# Patient Record
Sex: Female | Born: 1940 | Race: White | Hispanic: No | Smoking: Never smoker
Health system: Southern US, Community
[De-identification: ages and names within clinical notes are randomized; demographics above are authoritative.]

## PROBLEM LIST (undated history)

## (undated) DIAGNOSIS — I4891 Unspecified atrial fibrillation: Secondary | ICD-10-CM

## (undated) DIAGNOSIS — IMO0001 Reserved for inherently not codable concepts without codable children: Secondary | ICD-10-CM

## (undated) DIAGNOSIS — I499 Cardiac arrhythmia, unspecified: Secondary | ICD-10-CM

## (undated) DIAGNOSIS — G4700 Insomnia, unspecified: Secondary | ICD-10-CM

## (undated) DIAGNOSIS — K219 Gastro-esophageal reflux disease without esophagitis: Secondary | ICD-10-CM

## (undated) DIAGNOSIS — J205 Acute bronchitis due to respiratory syncytial virus: Secondary | ICD-10-CM

## (undated) DIAGNOSIS — G4730 Sleep apnea, unspecified: Secondary | ICD-10-CM

## (undated) DIAGNOSIS — M1710 Unilateral primary osteoarthritis, unspecified knee: Secondary | ICD-10-CM

## (undated) DIAGNOSIS — I10 Essential (primary) hypertension: Secondary | ICD-10-CM

## (undated) DIAGNOSIS — Z8719 Personal history of other diseases of the digestive system: Secondary | ICD-10-CM

## (undated) DIAGNOSIS — G250 Essential tremor: Secondary | ICD-10-CM

## (undated) DIAGNOSIS — M179 Osteoarthritis of knee, unspecified: Secondary | ICD-10-CM

## (undated) DIAGNOSIS — Z9189 Other specified personal risk factors, not elsewhere classified: Secondary | ICD-10-CM

## (undated) DIAGNOSIS — J42 Unspecified chronic bronchitis: Secondary | ICD-10-CM

## (undated) DIAGNOSIS — E785 Hyperlipidemia, unspecified: Secondary | ICD-10-CM

## (undated) DIAGNOSIS — S301XXA Contusion of abdominal wall, initial encounter: Secondary | ICD-10-CM

## (undated) DIAGNOSIS — R928 Other abnormal and inconclusive findings on diagnostic imaging of breast: Secondary | ICD-10-CM

## (undated) DIAGNOSIS — E039 Hypothyroidism, unspecified: Secondary | ICD-10-CM

## (undated) HISTORY — PX: TONSILLECTOMY: SUR1361

## (undated) HISTORY — DX: Unilateral primary osteoarthritis, unspecified knee: M17.10

## (undated) HISTORY — PX: CARDIAC CATHETERIZATION: SHX172

## (undated) HISTORY — DX: Hypothyroidism, unspecified: E03.9

## (undated) HISTORY — DX: Acute bronchitis due to respiratory syncytial virus: J20.5

## (undated) HISTORY — DX: Osteoarthritis of knee, unspecified: M17.9

## (undated) HISTORY — DX: Essential (primary) hypertension: I10

## (undated) HISTORY — DX: Hyperlipidemia, unspecified: E78.5

## (undated) HISTORY — PX: CARDIOVERSION: SHX1299

## (undated) HISTORY — DX: Unspecified atrial fibrillation: I48.91

## (undated) HISTORY — DX: Personal history of other diseases of the digestive system: Z87.19

## (undated) HISTORY — DX: Other specified personal risk factors, not elsewhere classified: Z91.89

## (undated) HISTORY — DX: Other abnormal and inconclusive findings on diagnostic imaging of breast: R92.8

## (undated) HISTORY — PX: JOINT REPLACEMENT: SHX530

## (undated) HISTORY — DX: Insomnia, unspecified: G47.00

## (undated) HISTORY — PX: DILATION AND CURETTAGE OF UTERUS: SHX78

## (undated) HISTORY — DX: Essential tremor: G25.0

---
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
## 1969-09-02 DIAGNOSIS — Z789 Other specified health status: Secondary | ICD-10-CM

## 1969-09-02 HISTORY — DX: Other specified health status: Z78.9

## 1969-09-02 HISTORY — PX: ABDOMINAL EXPLORATION SURGERY: SHX538

## 1969-09-02 HISTORY — PX: VAGINAL HYSTERECTOMY: SUR661

## 1978-09-02 HISTORY — PX: CHOLECYSTECTOMY: SHX55

## 2001-03-06 ENCOUNTER — Emergency Department (HOSPITAL_COMMUNITY): Admission: EM | Admit: 2001-03-06 | Discharge: 2001-03-06 | Payer: Self-pay | Admitting: Emergency Medicine

## 2001-03-06 ENCOUNTER — Encounter: Payer: Self-pay | Admitting: Emergency Medicine

## 2001-03-09 ENCOUNTER — Inpatient Hospital Stay (HOSPITAL_COMMUNITY): Admission: AD | Admit: 2001-03-09 | Discharge: 2001-03-10 | Payer: Self-pay | Admitting: Cardiology

## 2001-03-09 ENCOUNTER — Encounter: Payer: Self-pay | Admitting: Emergency Medicine

## 2001-03-13 ENCOUNTER — Ambulatory Visit (HOSPITAL_COMMUNITY): Admission: RE | Admit: 2001-03-13 | Discharge: 2001-03-13 | Payer: Self-pay | Admitting: Cardiology

## 2003-09-03 HISTORY — PX: REPLACEMENT TOTAL KNEE: SUR1224

## 2007-03-12 ENCOUNTER — Ambulatory Visit: Payer: Self-pay | Admitting: Internal Medicine

## 2008-09-02 DIAGNOSIS — I4891 Unspecified atrial fibrillation: Secondary | ICD-10-CM

## 2008-09-02 HISTORY — DX: Unspecified atrial fibrillation: I48.91

## 2008-10-14 ENCOUNTER — Inpatient Hospital Stay: Payer: Self-pay | Admitting: Cardiovascular Disease

## 2009-06-24 ENCOUNTER — Observation Stay (HOSPITAL_COMMUNITY): Admission: EM | Admit: 2009-06-24 | Discharge: 2009-06-25 | Payer: Self-pay | Admitting: Emergency Medicine

## 2009-06-28 ENCOUNTER — Emergency Department (HOSPITAL_COMMUNITY): Admission: EM | Admit: 2009-06-28 | Discharge: 2009-06-28 | Payer: Self-pay | Admitting: Family Medicine

## 2009-07-04 ENCOUNTER — Inpatient Hospital Stay (HOSPITAL_COMMUNITY): Admission: EM | Admit: 2009-07-04 | Discharge: 2009-07-07 | Payer: Self-pay | Admitting: Emergency Medicine

## 2009-07-04 ENCOUNTER — Encounter (INDEPENDENT_AMBULATORY_CARE_PROVIDER_SITE_OTHER): Payer: Self-pay | Admitting: Internal Medicine

## 2009-07-04 ENCOUNTER — Ambulatory Visit: Payer: Self-pay | Admitting: Surgery

## 2009-07-06 ENCOUNTER — Ambulatory Visit: Payer: Self-pay | Admitting: Infectious Diseases

## 2009-10-19 ENCOUNTER — Ambulatory Visit: Payer: Self-pay

## 2010-12-05 LAB — BASIC METABOLIC PANEL
BUN: 11 mg/dL (ref 6–23)
Calcium: 8.5 mg/dL (ref 8.4–10.5)
Chloride: 104 mEq/L (ref 96–112)
Chloride: 105 mEq/L (ref 96–112)
Creatinine, Ser: 0.81 mg/dL (ref 0.4–1.2)
Creatinine, Ser: 0.94 mg/dL (ref 0.4–1.2)
GFR calc Af Amer: >60 mL/min (ref >60)
GFR calc Af Amer: >60 mL/min (ref >60)
GFR calc non Af Amer: 59 mL/min — ABNORMAL LOW (ref >60)
Potassium: 4.2 mEq/L (ref 3.5–5.1)
Potassium: 4.3 mEq/L (ref 3.5–5.1)

## 2010-12-05 LAB — DIFFERENTIAL
Basophils Absolute: 0.1 10*3/uL (ref 0.0–0.1)
Basophils Relative: 1 % (ref 0–1)
Eosinophils Absolute: 0.2 10*3/uL (ref 0.0–0.7)
Eosinophils Relative: 2 % (ref 0–5)
Lymphocytes Relative: 19 % (ref 12–46)
Lymphocytes Relative: 7 % — ABNORMAL LOW (ref 12–46)
Monocytes Absolute: 0.1 10*3/uL (ref 0.1–1.0)
Monocytes Relative: 1 % — ABNORMAL LOW (ref 3–12)
Monocytes Relative: 8 % (ref 3–12)
Neutro Abs: 6.1 10*3/uL (ref 1.7–7.7)
Neutro Abs: 7.4 10*3/uL (ref 1.7–7.7)
Neutrophils Relative %: 71 % (ref 43–77)

## 2010-12-05 LAB — CULTURE, BLOOD (ROUTINE X 2)
Culture: NO GROWTH
Culture: NO GROWTH
Culture: NO GROWTH

## 2010-12-05 LAB — CBC
HCT: 33.5 % — ABNORMAL LOW (ref 36.0–46.0)
HCT: 36.3 % (ref 36.0–46.0)
HCT: 36.5 % (ref 36.0–46.0)
Hemoglobin: 12.5 g/dL (ref 12.0–15.0)
Hemoglobin: 13.5 g/dL (ref 12.0–15.0)
MCHC: 34.4 g/dL (ref 30.0–36.0)
MCV: 87.9 fL (ref 78.0–100.0)
Platelets: 281 10*3/uL (ref 150–400)
Platelets: 299 10*3/uL (ref 150–400)
RDW: 15.2 % (ref 11.5–15.5)
WBC: 7.2 10*3/uL (ref 4.0–10.5)
WBC: 8 10*3/uL (ref 4.0–10.5)
WBC: 8.6 10*3/uL (ref 4.0–10.5)
WBC: 8.8 10*3/uL (ref 4.0–10.5)

## 2010-12-05 LAB — PROTIME-INR: INR: 0.95 (ref 0.00–1.49)

## 2010-12-06 LAB — URINALYSIS, ROUTINE W REFLEX MICROSCOPIC
Bilirubin Urine: NEGATIVE
Glucose, UA: NEGATIVE mg/dL
Ketones, ur: NEGATIVE mg/dL
Leukocytes, UA: NEGATIVE
Protein, ur: NEGATIVE mg/dL

## 2010-12-06 LAB — CBC
HCT: 43.2 % (ref 36.0–46.0)
MCV: 86.7 fL (ref 78.0–100.0)
Platelets: 195 10*3/uL (ref 150–400)
RBC: 4.99 MIL/uL (ref 3.87–5.11)

## 2010-12-06 LAB — POCT I-STAT, CHEM 8
BUN: 20 mg/dL (ref 6–23)
Calcium, Ion: 1.14 mmol/L (ref 1.12–1.32)
Creatinine, Ser: 0.8 mg/dL (ref 0.4–1.2)
Potassium: 4.3 mEq/L (ref 3.5–5.1)
Sodium: 138 mEq/L (ref 135–145)
TCO2: 25 mmol/L (ref 0–100)

## 2010-12-06 LAB — COMPREHENSIVE METABOLIC PANEL
ALT: 27 U/L (ref 0–35)
AST: 38 U/L — ABNORMAL HIGH (ref 0–37)
Albumin: 4.1 g/dL (ref 3.5–5.2)
CO2: 28 mEq/L (ref 19–32)
Calcium: 8.9 mg/dL (ref 8.4–10.5)
Chloride: 103 mEq/L (ref 96–112)
Glucose, Bld: 126 mg/dL — ABNORMAL HIGH (ref 70–99)
Sodium: 137 mEq/L (ref 135–145)
Total Bilirubin: 1.1 mg/dL (ref 0.3–1.2)
Total Protein: 7 g/dL (ref 6.0–8.3)

## 2010-12-06 LAB — TYPE AND SCREEN: ABO/RH(D): AB POS

## 2010-12-06 LAB — ABO/RH: ABO/RH(D): AB POS

## 2010-12-06 LAB — PROTIME-INR: INR: 0.94 (ref 0.00–1.49)

## 2011-01-18 NOTE — Discharge Summary (Signed)
[place]. [facility]  Patient:    Mckenzie Frank, Mckenzie Frank                         MRN: [id number] Adm. Date:  [id number] Disc. Date: [id number] Attending:  Nelta Numbers Dictator:   Brita Romp, P.A. CC:         Kari Baars, M.D., [address]  [place], [place] [address]   Discharge Summary  There was no dictation for this report.  Just added CC. DD:  03/10/01 TD:  03/10/01 Job: [id number]

## 2011-01-18 NOTE — Discharge Summary (Signed)
[place]. [facility]  Patient:    Mckenzie Frank, Mckenzie Frank                         MRN: [id number] Adm. Date:  [id number] Disc. Date: 03/10/01 Attending:  Nelta Numbers Dictator:   Brita Romp, P.A.C. CC:          Cardiology, [place] [facility]   Discharge Summary  DISCHARGE DIAGNOSES: 1. Chest pain, negative cardiac enzymes. 2. Hypertension. 3. Treated hypothyroidism. 4. Recurrent headaches. 5. Anxiety.  HOSPITAL COURSE:  Ms. Shanon Rosser is a 70 year old female nonsmoker with a long history of hypertension.  She presented to the [facility] ER complaining of chest pain and headaches.  After evaluation there, she was transferred to [facility] for further cardiac workup.  She was seen and admitted by Dr. Antoine Poche.  He felt that her chest pain was atypical with no evidence of ischemia.  He felt that if her cardiac enzymes were negative then she could be safely scheduled for an out of hospital stress test.  She also reported some palpitations which she felt could best be evaluated with outpatient event monitor.   He also felt that her hypertension needed further investigation and that her workup to rule out a theochromocytoma would be warranted.  The next day, the patient was seen by Gerrit Friends. Rothbart, M.D. [facility]  She reported some minor nausea, but no further chest pain.  Dr. Dietrich Pates noted that the patients enzymes were serially negative and that her second EKG was also negative.  As a result, he felt that the patient was stable for discharge.  MEDICATIONS:  1. Catapres TTS 0.2 mg size change weekly.  2. Labetolol 200 mg b.i.d.  3. Norvasc 10 mg q.d.  4. Hydrochlorothiazide 12.5 mg q.d.  5. K-Dur 10 mEq q.d.  6. Avapro 150 mg q.d.  7. Xanax 0.25 mg t.i.d. as needed.  8. Premarin 0.625 mg q.d.  9. Levoxyl 0.025 mg q.d. 10. Neurontin 300 mg b.i.d. 11. Celebrex as previously taken.  DISCHARGE INSTRUCTIONS:  The  patient was advised to return to a normal level of activity.  She was instructed to follow a low salt and low fat.  She was instructed to stop taking the Lasix or Clonidine pills she had at home.  She is to follow up with a P.A. visit in the [facility] on July 23, at 12:30.  She is to also obtain an outpatient stress test as well as Doppler carotid ultrasound at [facility] and the office is to call with times.  LABORATORY DATA:  White count 6.1, hemoglobin 14.6, hematocrit 42.4, platelets 241.  Sodium 140, potassium 4.1, chloride 106, CO2 26, BUN 12, creatinine 0.8, glucose 97.  Cardiac enzymes were negative for MI.  TSH was 3.171.  Electrocardiogram revealed normal sinus rhythm with a first degree AV block. Ventricular rate was 66.  PR interval was 0.228, QRS 0.102, QTC 0.446 with an axis of -17. DD:  03/10/01 TD:  03/10/01 Job: [id number]

## 2011-07-23 ENCOUNTER — Ambulatory Visit: Payer: Self-pay | Admitting: Family Medicine

## 2011-07-31 ENCOUNTER — Telehealth (INDEPENDENT_AMBULATORY_CARE_PROVIDER_SITE_OTHER): Payer: Self-pay

## 2011-07-31 NOTE — Telephone Encounter (Signed)
LMOM to remind pt to bring films and reports from [facility].

## 2011-08-06 ENCOUNTER — Encounter (INDEPENDENT_AMBULATORY_CARE_PROVIDER_SITE_OTHER): Payer: Self-pay | Admitting: General Surgery

## 2011-08-15 ENCOUNTER — Other Ambulatory Visit (INDEPENDENT_AMBULATORY_CARE_PROVIDER_SITE_OTHER): Payer: Self-pay | Admitting: General Surgery

## 2011-08-15 ENCOUNTER — Other Ambulatory Visit (INDEPENDENT_AMBULATORY_CARE_PROVIDER_SITE_OTHER): Payer: Self-pay | Admitting: Surgery

## 2011-08-15 ENCOUNTER — Ambulatory Visit (INDEPENDENT_AMBULATORY_CARE_PROVIDER_SITE_OTHER): Payer: Medicare Other | Admitting: General Surgery

## 2011-08-15 ENCOUNTER — Encounter (INDEPENDENT_AMBULATORY_CARE_PROVIDER_SITE_OTHER): Payer: Self-pay | Admitting: General Surgery

## 2011-08-15 VITALS — BP 130/72 | HR 60 | Temp 97.6°F | Resp 24 | Ht 67.5 in | Wt 282.0 lb

## 2011-08-15 DIAGNOSIS — N6320 Unspecified lump in the left breast, unspecified quadrant: Secondary | ICD-10-CM

## 2011-08-15 DIAGNOSIS — N63 Unspecified lump in unspecified breast: Secondary | ICD-10-CM

## 2011-08-15 NOTE — Progress Notes (Signed)
Patient ID: Mckenzie Frank, female   DOB: 08-14-1941, 70 y.o.   MRN: [id number]  Chief Complaint  Patient presents with  . Other    new pt- eval lt breast mass    HPI Mckenzie Frank is a 70 y.o. female.  She is referred by Dr. Lupe Carney for evaluation of an abnormal mammogram that was done at [facility].  The patient states that 2 weeks ago she felt a small lump in her left breast medially at the 9:00 position. She has not had any breast problems in the past. She had ultrasound and mammogram at [facility]. I have a CD of this but can only see a few of the images. Described is a small 7 mm density in the left breast at the 9:00 position 6 cm from the nipple. They called this BI-RADS category 4 and she was referred for surgical consultation.  Family history is negative for breast cancer and negative for ovarian cancer.  Significant medical history of atrial fibrillation on Coumadin, hypertension, and thyroid disease.HPI  Past Medical History  Diagnosis Date  . Atrial fibrillation   . Hyperlipidemia   . Hypertension   . Thyroid disease   . Arthritis     Past Surgical History  Procedure Date  . Joint replacement     knee  . Abdominal hysterectomy   . Appendectomy   . Dilation and curettage of uterus     Family History  Problem Relation Age of Onset  . Heart disease Mother     Social History History  Substance Use Topics  . Smoking status: Never Smoker   . Smokeless tobacco: Not on file  . Alcohol Use: No    Allergies  Allergen Reactions  . Clindamycin/Lincomycin   . Doxycycline   . Sulfa Antibiotics   . Valium     Current Outpatient Prescriptions  Medication Sig Dispense Refill  . amLODipine-benazepril (LOTREL) 10-20 MG per capsule Take 1 capsule by mouth daily.        . flecainide (TAMBOCOR) 100 MG tablet Take 100 mg by mouth 2 (two) times daily.        . furosemide (LASIX) 40 MG tablet Take 40 mg by mouth 2 (two) times daily.        .  lansoprazole (PREVACID) 15 MG capsule Take 15 mg by mouth daily.        Marland Kitchen levothyroxine (SYNTHROID, LEVOTHROID) 25 MCG tablet Take 25 mcg by mouth daily.        . metoprolol (LOPRESSOR) 50 MG tablet Take 50 mg by mouth 2 (two) times daily.        . Nutritional Supplements (MELATONIN PO) Take 5 mg by mouth daily.        . simvastatin (ZOCOR) 40 MG tablet Take 40 mg by mouth at bedtime.        Marland Kitchen warfarin (COUMADIN) 5 MG tablet Take 5 mg by mouth daily.          Review of Systems Review of Systems  Constitutional: Negative for fever, chills and unexpected weight change.  HENT: Negative for hearing loss, congestion, sore throat, trouble swallowing and voice change.   Eyes: Negative for visual disturbance.  Respiratory: Negative for cough and wheezing.   Cardiovascular: Negative for chest pain, palpitations and leg swelling.  Gastrointestinal: Negative for nausea, vomiting, abdominal pain, diarrhea, constipation, blood in stool, abdominal distention and anal bleeding.  Genitourinary: Negative for hematuria, vaginal bleeding and difficulty urinating.  Musculoskeletal: Negative for arthralgias.  Skin: Negative for rash and wound.  Neurological: Negative for seizures, syncope and headaches.  Hematological: Negative for adenopathy. Does not bruise/bleed easily.  Psychiatric/Behavioral: Negative for confusion.    Blood pressure 130/72, pulse 60, temperature 97.6 F (36.4 C), temperature source Temporal, resp. rate 24, height 5' 7.5" (1.715 m), weight 282 lb (127.914 kg).  Physical Exam Physical Exam  Constitutional: She is oriented to person, place, and time. She appears well-developed and well-nourished. No distress.  HENT:  Head: Normocephalic and atraumatic.  Nose: Nose normal.  Mouth/Throat: No oropharyngeal exudate.  Eyes: Conjunctivae and EOM are normal. Pupils are equal, round, and reactive to light. Left eye exhibits no discharge. No scleral icterus.  Neck: Neck supple. No JVD  present. No tracheal deviation present. No thyromegaly present.  Cardiovascular: Normal rate, regular rhythm, normal heart sounds and intact distal pulses.   No murmur heard. Pulmonary/Chest: Effort normal and breath sounds normal. No respiratory distress. She has no wheezes. She has no rales. She exhibits no tenderness.    Abdominal: Soft. Bowel sounds are normal. She exhibits no distension and no mass. There is no tenderness. There is no rebound and no guarding.  Musculoskeletal: She exhibits no edema and no tenderness.  Lymphadenopathy:    She has no cervical adenopathy.  Neurological: She is alert and oriented to person, place, and time. She exhibits normal muscle tone. Coordination normal.       Head tremor.  Skin: Skin is warm. No rash noted. She is not diaphoretic. No erythema. No pallor.  Psychiatric: She has a normal mood and affect. Her behavior is normal. Judgment and thought content normal.    Data Reviewed I have reviewed the images from now has regional that were sent on a CD. Assessment    Left breast mass, 7 mm, 9:00 position. Indeterminate significance. Further evaluation warranted. Biopsy possibly indicated.  Chronic atrial fibrillation on Coumadin  Hypertension  Thyroid disease    Plan    The patient is referred to the breast center of [facility] for a second radiologic opinion.  Return to see me in 2-3 weeks after this opinion is completed.       Kaiyana Bedore M 08/15/2011, 10:46 AM

## 2011-08-15 NOTE — Patient Instructions (Signed)
I can feel the small lump in your left breast at the 9:00 position. I am not sure whether this represents an abnormality or not. I am referring you for a second opinion at the breast center of [facility] radiologist. You will return to see me in 2-3 weeks after their evaluation is completed.

## 2011-08-20 ENCOUNTER — Ambulatory Visit
Admission: RE | Admit: 2011-08-20 | Discharge: 2011-08-20 | Disposition: A | Discharge status: Home / Self Care | Payer: Self-pay | Source: Ambulatory Visit | Attending: General Surgery | Admitting: General Surgery

## 2011-08-20 ENCOUNTER — Other Ambulatory Visit (INDEPENDENT_AMBULATORY_CARE_PROVIDER_SITE_OTHER): Payer: Self-pay | Admitting: General Surgery

## 2011-08-20 DIAGNOSIS — N6320 Unspecified lump in the left breast, unspecified quadrant: Secondary | ICD-10-CM

## 2011-08-21 ENCOUNTER — Encounter (INDEPENDENT_AMBULATORY_CARE_PROVIDER_SITE_OTHER): Payer: Self-pay | Admitting: Family Medicine

## 2011-09-09 ENCOUNTER — Ambulatory Visit (INDEPENDENT_AMBULATORY_CARE_PROVIDER_SITE_OTHER): Payer: Medicare Other | Admitting: General Surgery

## 2011-09-09 ENCOUNTER — Encounter (INDEPENDENT_AMBULATORY_CARE_PROVIDER_SITE_OTHER): Payer: Self-pay | Admitting: General Surgery

## 2011-09-09 VITALS — BP 142/74 | HR 56 | Temp 97.2°F | Resp 18 | Ht 67.5 in | Wt 283.2 lb

## 2011-09-09 DIAGNOSIS — N641 Fat necrosis of breast: Secondary | ICD-10-CM

## 2011-09-09 NOTE — Patient Instructions (Signed)
The left breast ultrasound performed at the breast Center of [facility] shows that you probably have an area of benign fat necrosis in the medial aspect of the left breast. We discussed whether to go ahead with a biopsy at this time, or to simply repeat the imaging studies and physical exam in 6 months.  We decided that we would repeat the left breast mammogram and ultrasound in 6 months at the breast Center of [place], and you will see me in the office after that is done for interval followup.

## 2011-09-09 NOTE — Progress Notes (Signed)
Subjective:     Patient ID: Mckenzie Frank, female   DOB: 1941/06/05, 71 y.o.   MRN: [id number]  HPI The patient has a small palpable nodule just deep to the dermis in the left breast at the 9:00 position.  She was referred to the [facility] for second opinion. Dr. Anselmo Pickler performed ultrasound and felt that she probably had an area of fat necrosis medially within the left breast at the 9:00 position corresponding to the palpable finding. He advised 6 month followup.  I talked with the patient for a long time and she is comfortable with six-month followup rather than biopsy.  Review of Systems     Objective:   Physical Exam Constitutional: Patient comfortable in no distress.  Breasts: Left breast is examined. Breast is large. At that position there is about a 5 mm very superficial smooth nodule. There is no overlying skin change.    Assessment:     Left breast mass, 5 mm, 9:00 position. Physical findings are consistent with a small cyst, fibroadenoma, or area of fat necrosis. Ultrasound suggests that necrosis.    Plan:     The patient was given the option of 6 month followup or excision of this mass surgically. She has elected 6 month followup. She understands that there is a very low, but still measurable risk that this could be a very early cancer. I doubt that is the case.  Repeat left breast mammogram and ultrasound 6 months, and followup with me in the office at that time.  The patient was told to call me and followup sooner if the mass enlarges or if there is any overlying skin change.

## 2011-12-30 DIAGNOSIS — Z8719 Personal history of other diseases of the digestive system: Secondary | ICD-10-CM | POA: Insufficient documentation

## 2011-12-30 DIAGNOSIS — Z7901 Long term (current) use of anticoagulants: Secondary | ICD-10-CM | POA: Insufficient documentation

## 2012-01-29 ENCOUNTER — Encounter (INDEPENDENT_AMBULATORY_CARE_PROVIDER_SITE_OTHER): Payer: Self-pay | Admitting: General Surgery

## 2012-06-16 ENCOUNTER — Telehealth: Payer: Self-pay | Admitting: Family Medicine

## 2012-06-16 NOTE — Telephone Encounter (Signed)
Ok to change appt.

## 2012-06-16 NOTE — Telephone Encounter (Signed)
Li unless there is an open appointment for a new patient I cannot take up any more urgent slots with new patients. I have no urgent slot available for my already established patient's. Her previous doctor can check her Coumadin level until she comes in to see me

## 2012-06-16 NOTE — Telephone Encounter (Signed)
Set up a New pt appointment with Dr. Darrick Huntsman in February but pt is on coumadin and needs PT/INR checked. She was wondering if she could get this appointment for New pt moved up. Her sister comes to Dr. Darrick Huntsman and highly recommeneds her and Mrs. Heidler really wants to see Dr. Darrick Huntsman.

## 2012-06-23 ENCOUNTER — Telehealth: Payer: Self-pay | Admitting: *Deleted

## 2012-07-07 ENCOUNTER — Other Ambulatory Visit: Payer: Self-pay

## 2012-07-07 ENCOUNTER — Ambulatory Visit (INDEPENDENT_AMBULATORY_CARE_PROVIDER_SITE_OTHER): Payer: Self-pay | Admitting: Family Medicine

## 2012-07-07 ENCOUNTER — Encounter: Payer: Self-pay | Admitting: Family Medicine

## 2012-07-07 VITALS — BP 160/90 | HR 64 | Temp 97.6°F | Ht 67.5 in | Wt 275.5 lb

## 2012-07-07 DIAGNOSIS — E785 Hyperlipidemia, unspecified: Secondary | ICD-10-CM

## 2012-07-07 DIAGNOSIS — Z23 Encounter for immunization: Secondary | ICD-10-CM

## 2012-07-07 DIAGNOSIS — I4891 Unspecified atrial fibrillation: Secondary | ICD-10-CM

## 2012-07-07 DIAGNOSIS — G252 Other specified forms of tremor: Secondary | ICD-10-CM

## 2012-07-07 DIAGNOSIS — Z96659 Presence of unspecified artificial knee joint: Secondary | ICD-10-CM | POA: Insufficient documentation

## 2012-07-07 DIAGNOSIS — N63 Unspecified lump in unspecified breast: Secondary | ICD-10-CM

## 2012-07-07 DIAGNOSIS — Z5181 Encounter for therapeutic drug level monitoring: Secondary | ICD-10-CM

## 2012-07-07 DIAGNOSIS — R251 Tremor, unspecified: Secondary | ICD-10-CM | POA: Insufficient documentation

## 2012-07-07 DIAGNOSIS — Z7901 Long term (current) use of anticoagulants: Secondary | ICD-10-CM

## 2012-07-07 DIAGNOSIS — M1710 Unilateral primary osteoarthritis, unspecified knee: Secondary | ICD-10-CM

## 2012-07-07 DIAGNOSIS — E039 Hypothyroidism, unspecified: Secondary | ICD-10-CM

## 2012-07-07 DIAGNOSIS — G250 Essential tremor: Secondary | ICD-10-CM

## 2012-07-07 DIAGNOSIS — I10 Essential (primary) hypertension: Secondary | ICD-10-CM | POA: Insufficient documentation

## 2012-07-07 LAB — POCT INR: INR: 1.9

## 2012-07-07 MED ORDER — FLECAINIDE ACETATE 100 MG PO TABS: 100.0000 mg | ORAL_TABLET | Freq: Two times a day (BID) | ORAL | Status: DC

## 2012-07-07 MED ORDER — LEVOTHYROXINE SODIUM 25 MCG PO TABS: 25.0000 ug | ORAL_TABLET | Freq: Every day | ORAL | Status: DC

## 2012-07-07 NOTE — Patient Instructions (Addendum)
Flu shot today. We will request records from Dr. Milas Gain and Dr. Caro Hight. Coumadin check today.  Indication Afib.  Goal INR 2-3. Pass by [place]'s office to schedule appointment with cardiologist in [place] ([place] or [place]). Return in 3 months fasting for blood work, afterwards for physical. Keep track of blood pressures at home.  If staying consistently >140/90, please let me know for increase in your blood pressure medicines.  Call me in 1-2 weeks with an update on your numbers.

## 2012-07-07 NOTE — Assessment & Plan Note (Signed)
Chronic. Check FLP next fasting blood work.

## 2012-07-07 NOTE — Telephone Encounter (Signed)
Rx's refilled as requested. New patient today-no labs.

## 2012-07-07 NOTE — Patient Instructions (Addendum)
2.5 mg daily, 5 mg MWF. Check in 2 weeks

## 2012-07-07 NOTE — Assessment & Plan Note (Signed)
Pt has noted for a while, not bothersome.  Declines meds.

## 2012-07-07 NOTE — Progress Notes (Signed)
Subjective:    Patient ID: Mckenzie Frank, female    DOB: 1940-09-15, 71 y.o.   MRN: [id number]  HPI CC: new pt to establish  Prior saw [facility].  Saw Dr. Caro Hight.  Then moved to [place].  Now moved back to [place], living in senior apartments.    afib - on flecainide 100 mg bid, metoprolol 50mg  bid, and coumadin (5mg  MWF, 2.5mg  every other day).  No recent INR check.  Would like [place] to follow today.  Takes for Afib, goal INR 2-3.  Borderline hypothyroid - on levo daily.  Breast mass, left - stable as of recent mammograms.  Thought possibly trauma related.  ?fat necrosis.  HTN - compliant with bp meds.  Does not check at home.    HLD - compliant and tolerant of zocor 10mg  daily.  No myalgias.  Last year had breast knot s/p mammogram and [place], ultimately normal biopsy.  Thought trauma related mass.  Rpt mammogram this year stable.  Skin tag on bottom of stomach - worried about this.  Preventative: Last CPE - 2012. Well woman with PCP H/o partial hysterectomy (1971) Mammogram 02/2012 Colonoscopy - 2005 normal per pt Tetanus - 2012 Flu shot - will receive today.  Caffeine: 1 cup coffee/day Lives alone in senior living.  Daughter in [place] Occupation: retired, Engineering geologist Edu: HS Activity: walks the track Diet:   Medications and allergies reviewed and updated in chart.  Past histories reviewed and updated if relevant as below. Patient Active Problem List  Diagnosis  . Atrial fibrillation  . Hyperlipidemia  . Hypertension  . Hypothyroidism  . Osteoarthritis of knee  . Breast mass  . Benign head tremor   Past Medical History  Diagnosis Date  . Atrial fibrillation 2010    on coumadin  . Hyperlipidemia   . Hypertension   . Hypothyroidism     borderline  . Osteoarthritis of knee     s/p replacement  . Breast mass     left  . Benign head tremor    Past Surgical History  Procedure Date  . Replacement total knee 2005    bilateral  . Abdominal  hysterectomy 1971    partial?  . Appendectomy 1947  . Dilation and curettage of uterus   . Cholecystectomy 1980   History  Substance Use Topics  . Smoking status: Never Smoker   . Smokeless tobacco: Never Used  . Alcohol Use: No   Family History  Problem Relation Age of Onset  . CAD Mother     angina  . Cancer Paternal Grandfather     ?  Marland Kitchen Alzheimer's disease Father     died from PNA  . Diabetes Mother   . Stroke Mother 58   Allergies  Allergen Reactions  . Clindamycin/Lincomycin   . Codeine   . Doxycycline   . Paxil (Paroxetine Hcl)   . Promethazine   . Sulfa Antibiotics   . Valium    Current Outpatient Prescriptions on File Prior to Visit  Medication Sig Dispense Refill  . amLODipine-benazepril (LOTREL) 10-20 MG per capsule Take 1 capsule by mouth daily.        . clonazePAM (KLONOPIN) 0.5 MG tablet Take 0.5 mg by mouth at bedtime.      . flecainide (TAMBOCOR) 100 MG tablet Take 100 mg by mouth 2 (two) times daily.       . furosemide (LASIX) 40 MG tablet Take 40 mg by mouth daily. 2nd dose PRN      .  lansoprazole (PREVACID) 15 MG capsule Take 15 mg by mouth daily.        Marland Kitchen levothyroxine (SYNTHROID, LEVOTHROID) 25 MCG tablet Take 25 mcg by mouth daily.        . metoprolol (LOPRESSOR) 50 MG tablet Take 50 mg by mouth 2 (two) times daily.        Marland Kitchen warfarin (COUMADIN) 5 MG tablet Take 5 mg by mouth daily.          Review of Systems  Constitutional: Negative for fever, chills, activity change, appetite change, fatigue and unexpected weight change.  HENT: Negative for hearing loss and neck pain.   Eyes: Negative for visual disturbance.  Respiratory: Negative for cough, chest tightness, shortness of breath and wheezing.   Cardiovascular: Negative for chest pain, palpitations and leg swelling.  Gastrointestinal: Negative for nausea, vomiting, abdominal pain, diarrhea, constipation, blood in stool and abdominal distention.  Genitourinary: Negative for hematuria and  difficulty urinating.  Musculoskeletal: Negative for myalgias and arthralgias.  Skin: Negative for rash.  Neurological: Negative for dizziness, seizures, syncope and headaches.  Hematological: Does not bruise/bleed easily.  Psychiatric/Behavioral: Negative for dysphoric mood. The patient is not nervous/anxious.        Objective:   Physical Exam  Nursing note and vitals reviewed. Constitutional: She is oriented to person, place, and time. She appears well-developed and well-nourished. No distress.       obese Head tremor noted  HENT:  Head: Normocephalic and atraumatic.  Right Ear: Hearing, tympanic membrane, external ear and ear canal normal.  Left Ear: Hearing, tympanic membrane, external ear and ear canal normal.  Nose: Nose normal.  Mouth/Throat: Oropharynx is clear and moist. No oropharyngeal exudate.  Eyes: Conjunctivae normal and EOM are normal. Pupils are equal, round, and reactive to light. No scleral icterus.  Neck: Normal range of motion. Neck supple. No thyromegaly present.  Cardiovascular: Normal rate, regular rhythm, normal heart sounds and intact distal pulses.   No murmur heard. Pulses:      Radial pulses are 2+ on the right side, and 2+ on the left side.       Sounding regular today  Pulmonary/Chest: Effort normal and breath sounds normal. No respiratory distress. She has no wheezes. She has no rales.  Abdominal: Soft. Bowel sounds are normal. She exhibits no distension and no mass. There is no tenderness. There is no rebound and no guarding.  Musculoskeletal: Normal range of motion. She exhibits no edema.  Lymphadenopathy:    She has no cervical adenopathy.  Neurological: She is alert and oriented to person, place, and time.       CN grossly intact, station and gait intact  Skin: Skin is warm and dry. No rash noted.       benign skin tag right lower abd  Psychiatric: She has a normal mood and affect. Her behavior is normal. Judgment and thought content normal.        Assessment & Plan:

## 2012-07-07 NOTE — Assessment & Plan Note (Signed)
Have requested records from prior PCP.  Last mammo per pt 02/2012, stable.  Just monitoring currently.

## 2012-07-07 NOTE — Assessment & Plan Note (Signed)
Not active problem since replacement.

## 2012-07-07 NOTE — Telephone Encounter (Signed)
Brianna called back pt only needs flecainide and levothyroxine 25 mcg refilled to [facility]. Was not sure of quantity or refills; did not see recent thyroid labs.Please advise.

## 2012-07-07 NOTE — Assessment & Plan Note (Signed)
Elevated today. Asked her to keep track at home and call me in 1-2 wks with log.  If staying elevated, change bp meds as needed. Pt agrees with plan. Have requested records from prior cards and prior PCP.

## 2012-07-07 NOTE — Assessment & Plan Note (Signed)
Will need TSH next blood draw.

## 2012-07-07 NOTE — Telephone Encounter (Signed)
Colin Mulders with [facility] pharmacy said she will contact pt's previous pharmacy and get meds transferred if there is a need for Dr Reece Agar to refill med now she will fax request to our office. Colin Mulders said pt OK with that.

## 2012-07-07 NOTE — Assessment & Plan Note (Signed)
Will establish with our coumadin clinic today as due for coumadin check.  Goal INR 2-3, indication afib. CHADS2 score = 1 as far as I can tell, will await records from prior cards and PCP.  H/o "heart shocks" x2. Will also refer to cards for assistance with anti arrhythmic.   Today sounds regular on exam.

## 2012-07-13 ENCOUNTER — Ambulatory Visit (INDEPENDENT_AMBULATORY_CARE_PROVIDER_SITE_OTHER): Payer: Medicare Other | Admitting: Cardiovascular Disease

## 2012-07-13 ENCOUNTER — Encounter: Payer: Self-pay | Admitting: Cardiovascular Disease

## 2012-07-13 VITALS — BP 128/62 | HR 54 | Ht 67.0 in | Wt 275.5 lb

## 2012-07-13 DIAGNOSIS — I4891 Unspecified atrial fibrillation: Secondary | ICD-10-CM

## 2012-07-13 DIAGNOSIS — I10 Essential (primary) hypertension: Secondary | ICD-10-CM

## 2012-07-13 NOTE — Progress Notes (Signed)
HPI  This is a 71 year old female who is here today to establish cardiovascular care. She has known history of paroxysmal atrial fibrillation diagnosed in 2010. She had cardioversion done once at that time. It seems that she had recurrent atrial fibrillation in 2011 and was placed on flecainide by Dr. Caro Hight her previous cardiologist. The patient moved back to the area and wants to establish cardiovascular followup year. She reports having a cardiac catheterization many years ago by Dr. Juliann Pares which showed no significant coronary artery disease. She thinks her last echocardiogram was in 2012. Since she has been on flecainide, she reports no further episodes of atrial fibrillation. She denies any chest pain or dyspnea. She has no history of congestive heart failure, stroke or diabetes.  Allergies  Allergen Reactions  . Clindamycin/Lincomycin   . Codeine   . Doxycycline   . Paxil (Paroxetine Hcl)   . Promethazine   . Sulfa Antibiotics   . Valium      Current Outpatient Prescriptions on File Prior to Visit  Medication Sig Dispense Refill  . amLODipine-benazepril (LOTREL) 10-20 MG per capsule Take 1 capsule by mouth daily.        . clonazePAM (KLONOPIN) 0.5 MG tablet Take 0.5 mg by mouth at bedtime.      . flecainide (TAMBOCOR) 100 MG tablet Take 1 tablet (100 mg total) by mouth 2 (two) times daily.  60 tablet  3  . furosemide (LASIX) 40 MG tablet Take 40 mg by mouth daily. 2nd dose PRN      . lansoprazole (PREVACID) 15 MG capsule Take 15 mg by mouth daily.        Marland Kitchen levothyroxine (SYNTHROID, LEVOTHROID) 25 MCG tablet Take 1 tablet (25 mcg total) by mouth daily.  30 tablet  3  . metoprolol (LOPRESSOR) 50 MG tablet Take 50 mg by mouth 2 (two) times daily.        . simvastatin (ZOCOR) 20 MG tablet Take 10 mg by mouth every evening.      . warfarin (COUMADIN) 5 MG tablet Take 5 mg by mouth as directed.          Past Medical History  Diagnosis Date  . Hyperlipidemia   . Hypertension     . Hypothyroidism     borderline  . Osteoarthritis of knee     s/p replacement  . Breast mass     left  . Benign head tremor   . Atrial fibrillation 2010    on coumadin     Past Surgical History  Procedure Date  . Replacement total knee 2005    bilateral  . Abdominal hysterectomy 1971    partial?  . Appendectomy 1947  . Dilation and curettage of uterus   . Cholecystectomy 1980  . Cardiac catheterization     [facility];Dr. Juliann Pares     Family History  Problem Relation Age of Onset  . CAD Mother     angina  . Cancer Paternal Grandfather     ?  Marland Kitchen Alzheimer's disease Father     died from PNA  . Diabetes Mother   . Stroke Mother 45     History   Social History  . Marital Status: Widowed    Spouse Name: N/A    Number of Children: N/A  . Years of Education: N/A   Occupational History  . Not on file.   Social History Main Topics  . Smoking status: Never Smoker   . Smokeless tobacco: Never Used  .  Alcohol Use: No  . Drug Use: No  . Sexually Active: Not on file   Other Topics Concern  . Not on file   Social History Narrative   Caffeine: 1 cup coffee/dayLives alone in senior living.  Daughter in [place]: retired, retailEdu: HSActivity: walks the trackDiet:      ROS Constitutional: Negative for fever, chills, diaphoresis, activity change, appetite change and fatigue.  HENT: Negative for hearing loss, nosebleeds, congestion, sore throat, facial swelling, drooling, trouble swallowing, neck pain, voice change, sinus pressure and tinnitus.  Eyes: Negative for photophobia, pain, discharge and visual disturbance.  Respiratory: Negative for apnea, cough, chest tightness, shortness of breath and wheezing.  Cardiovascular: Negative for chest pain, palpitations and leg swelling.  Gastrointestinal: Negative for nausea, vomiting, abdominal pain, diarrhea, constipation, blood in stool and abdominal distention.  Genitourinary: Negative for dysuria, urgency, frequency,  hematuria and decreased urine volume.  Musculoskeletal: Negative for myalgias, back pain, joint swelling, arthralgias and gait problem.  Skin: Negative for color change, pallor, rash and wound.  Neurological: Negative for dizziness, tremors, seizures, syncope, speech difficulty, weakness, light-headedness, numbness and headaches.  Psychiatric/Behavioral: Negative for suicidal ideas, hallucinations, behavioral problems and agitation. The patient is not nervous/anxious.     PHYSICAL EXAM   BP 128/62  Pulse 54  Ht 5\' 7"  (1.702 m)  Wt 275 lb 8 oz (124.966 kg)  BMI 43.15 kg/m2 Constitutional: She is oriented to person, place, and time. She appears well-developed and well-nourished. No distress.  HENT: No nasal discharge.  Head: Normocephalic and atraumatic.  Eyes: Pupils are equal and round. Right eye exhibits no discharge. Left eye exhibits no discharge.  Neck: Normal range of motion. Neck supple. No JVD present. No thyromegaly present.  Cardiovascular: Normal rate, regular rhythm, normal heart sounds. Exam reveals no gallop and no friction rub. No murmur heard.  Pulmonary/Chest: Effort normal and breath sounds normal. No stridor. No respiratory distress. She has no wheezes. She has no rales. She exhibits no tenderness.  Abdominal: Soft. Bowel sounds are normal. She exhibits no distension. There is no tenderness. There is no rebound and no guarding.  Musculoskeletal: Normal range of motion. She exhibits no edema and no tenderness.  Neurological: She is alert and oriented to person, place, and time. Coordination normal.  Skin: Skin is warm and dry. No rash noted. She is not diaphoretic. No erythema. No pallor.  Psychiatric: She has a normal mood and affect. Her behavior is normal. Judgment and thought content normal.     EKG: Sinus  Bradycardia  -Nonspecific QRS widening. QRS duration is 120 ms. Normal PR and QT intervals.  BORDERLINE   ASSESSMENT AND PLAN

## 2012-07-13 NOTE — Patient Instructions (Addendum)
Continue same medications.  Request cardiac records from Dr. Caro Hight [facility] cardiology).  Follow up in 6 months.

## 2012-07-13 NOTE — Assessment & Plan Note (Addendum)
The patient is maintaining in sinus rhythm with flecainide with no recurrent episodes. She seems to be stable from a cardiac standpoint. She is slightly bradycardic but overall is asymptomatic. Thus, I will continue the same dose flecainide and metoprolol. Although her CHADS2 score is 1 due to hypertension, CHADS VAS score is 3 due to hypertension, age of 76 and female gender. Thus, she is considered at moderate risk for thromboembolic complications and thus I recommend continuing anticoagulation. I will request her cardiac records from her previous cardiologist. If she has not had an echocardiogram within the last 2 years, she will need to have one given that she is on flecainide and will need to make sure that there are no significant left ventricular hypertrophy or other structural abnormalities.

## 2012-07-13 NOTE — Assessment & Plan Note (Signed)
Her blood pressure was elevated recently. However, it's well-controlled today.

## 2012-07-21 ENCOUNTER — Telehealth: Payer: Self-pay | Admitting: *Deleted

## 2012-07-21 ENCOUNTER — Ambulatory Visit (INDEPENDENT_AMBULATORY_CARE_PROVIDER_SITE_OTHER): Payer: Medicare Other | Admitting: Family Medicine

## 2012-07-21 DIAGNOSIS — E039 Hypothyroidism, unspecified: Secondary | ICD-10-CM

## 2012-07-21 DIAGNOSIS — Z7901 Long term (current) use of anticoagulants: Secondary | ICD-10-CM

## 2012-07-21 DIAGNOSIS — I10 Essential (primary) hypertension: Secondary | ICD-10-CM

## 2012-07-21 DIAGNOSIS — I4891 Unspecified atrial fibrillation: Secondary | ICD-10-CM

## 2012-07-21 DIAGNOSIS — Z5181 Encounter for therapeutic drug level monitoring: Secondary | ICD-10-CM

## 2012-07-21 NOTE — Telephone Encounter (Signed)
Patient dropped off BP readings. In your IN box

## 2012-07-21 NOTE — Patient Instructions (Signed)
Continue 2.5 mg daily, 5 mg MWF. Check in 4 weeks

## 2012-07-24 MED ORDER — AMLODIPINE BESY-BENAZEPRIL HCL 10-40 MG PO CAPS: 1.0000 | ORAL_CAPSULE | Freq: Every day | ORAL | Status: DC

## 2012-07-24 NOTE — Telephone Encounter (Signed)
Reviewed log of bp's at home, noted normal reading at cards appt. BP Readings from Last 3 Encounters:  07/13/12 128/62  07/07/12 160/90  09/09/11 142/74  at home, ranging 144-172/70-80s.  One low reading of 128/70.  HR already mildly bradycardic  I recommend increase in lotrel (amlodipine benazepril) to 10mg /40mg  - sent new dose into pharmacy.  To come in for blood work 1 week after starting higher dose to ensure kidneys tolerating well.  Ensure staying well hydrated with this med as well.

## 2012-07-24 NOTE — Telephone Encounter (Signed)
Message left for patient to return my call.  

## 2012-07-28 NOTE — Telephone Encounter (Signed)
Opened in error

## 2012-07-29 NOTE — Telephone Encounter (Signed)
Patient notified and lab appt scheduled.  

## 2012-08-05 ENCOUNTER — Other Ambulatory Visit: Payer: Self-pay | Admitting: *Deleted

## 2012-08-06 MED ORDER — CLONAZEPAM 0.5 MG PO TABS: 0.5000 mg | ORAL_TABLET | Freq: Every day | ORAL | Status: DC

## 2012-08-06 NOTE — Telephone Encounter (Signed)
rx called into pharmacy

## 2012-08-06 NOTE — Telephone Encounter (Signed)
plz phone in. 

## 2012-08-07 ENCOUNTER — Other Ambulatory Visit (INDEPENDENT_AMBULATORY_CARE_PROVIDER_SITE_OTHER): Payer: Medicare Other

## 2012-08-07 DIAGNOSIS — E039 Hypothyroidism, unspecified: Secondary | ICD-10-CM

## 2012-08-07 DIAGNOSIS — I10 Essential (primary) hypertension: Secondary | ICD-10-CM

## 2012-08-07 LAB — BASIC METABOLIC PANEL
Calcium: 9.1 mg/dL (ref 8.4–10.5)
GFR: 66.89 mL/min (ref >60.00)
Glucose, Bld: 102 mg/dL — ABNORMAL HIGH (ref 70–99)
Potassium: 4.5 mEq/L (ref 3.5–5.1)
Sodium: 141 mEq/L (ref 135–145)

## 2012-08-07 LAB — TSH: TSH: 3.81 u[IU]/mL (ref 0.35–5.50)

## 2012-08-18 ENCOUNTER — Ambulatory Visit (INDEPENDENT_AMBULATORY_CARE_PROVIDER_SITE_OTHER): Payer: Medicare Other | Admitting: General Practice

## 2012-08-18 DIAGNOSIS — I4891 Unspecified atrial fibrillation: Secondary | ICD-10-CM

## 2012-08-18 DIAGNOSIS — Z7901 Long term (current) use of anticoagulants: Secondary | ICD-10-CM

## 2012-08-18 DIAGNOSIS — Z5181 Encounter for therapeutic drug level monitoring: Secondary | ICD-10-CM

## 2012-08-18 LAB — POCT INR: INR: 2.1

## 2012-08-23 ENCOUNTER — Encounter: Payer: Self-pay | Admitting: Family Medicine

## 2012-08-23 DIAGNOSIS — G4700 Insomnia, unspecified: Secondary | ICD-10-CM | POA: Insufficient documentation

## 2012-09-07 ENCOUNTER — Ambulatory Visit: Payer: Medicare Other

## 2012-09-09 ENCOUNTER — Other Ambulatory Visit: Payer: Self-pay

## 2012-09-09 MED ORDER — CLONAZEPAM 0.5 MG PO TABS: 0.5000 mg | ORAL_TABLET | Freq: Every day | ORAL | Status: DC

## 2012-09-09 NOTE — Telephone Encounter (Signed)
Colin Mulders at [facility] pharmacy left v/m requesting refill clonazepam.Please advise.

## 2012-09-09 NOTE — Telephone Encounter (Signed)
plz phone in. 

## 2012-09-10 NOTE — Telephone Encounter (Signed)
Rx called in as directed.   

## 2012-09-14 ENCOUNTER — Ambulatory Visit (INDEPENDENT_AMBULATORY_CARE_PROVIDER_SITE_OTHER): Payer: Medicare Other | Admitting: General Practice

## 2012-09-14 DIAGNOSIS — Z5181 Encounter for therapeutic drug level monitoring: Secondary | ICD-10-CM

## 2012-09-14 DIAGNOSIS — Z7901 Long term (current) use of anticoagulants: Secondary | ICD-10-CM

## 2012-09-14 DIAGNOSIS — I4891 Unspecified atrial fibrillation: Secondary | ICD-10-CM

## 2012-09-14 LAB — POCT INR: INR: 2

## 2012-10-08 ENCOUNTER — Other Ambulatory Visit: Payer: Self-pay | Admitting: *Deleted

## 2012-10-08 MED ORDER — CLONAZEPAM 0.5 MG PO TABS: 0.5000 mg | ORAL_TABLET | Freq: Every day | ORAL | Status: DC

## 2012-10-08 NOTE — Telephone Encounter (Signed)
OK to refill

## 2012-10-08 NOTE — Telephone Encounter (Signed)
plz phone in. 

## 2012-10-08 NOTE — Telephone Encounter (Signed)
Rx phoned to pharmacy.  

## 2012-10-26 ENCOUNTER — Ambulatory Visit (INDEPENDENT_AMBULATORY_CARE_PROVIDER_SITE_OTHER): Payer: Medicare Other | Admitting: General Practice

## 2012-10-26 DIAGNOSIS — Z7901 Long term (current) use of anticoagulants: Secondary | ICD-10-CM

## 2012-10-26 DIAGNOSIS — I4891 Unspecified atrial fibrillation: Secondary | ICD-10-CM

## 2012-10-26 DIAGNOSIS — Z5181 Encounter for therapeutic drug level monitoring: Secondary | ICD-10-CM

## 2012-10-26 LAB — POCT INR: INR: 1.7

## 2012-10-28 ENCOUNTER — Ambulatory Visit: Payer: Self-pay | Admitting: Internal Medicine

## 2012-10-29 ENCOUNTER — Other Ambulatory Visit: Payer: Self-pay | Admitting: Family Medicine

## 2012-10-29 ENCOUNTER — Ambulatory Visit (INDEPENDENT_AMBULATORY_CARE_PROVIDER_SITE_OTHER): Payer: Medicare Other | Admitting: Family Medicine

## 2012-10-29 ENCOUNTER — Encounter: Payer: Self-pay | Admitting: Family Medicine

## 2012-10-29 VITALS — BP 134/82 | HR 68 | Temp 97.6°F | Ht 67.5 in | Wt 279.5 lb

## 2012-10-29 DIAGNOSIS — N63 Unspecified lump in unspecified breast: Secondary | ICD-10-CM

## 2012-10-29 DIAGNOSIS — Z780 Asymptomatic menopausal state: Secondary | ICD-10-CM

## 2012-10-29 DIAGNOSIS — I4891 Unspecified atrial fibrillation: Secondary | ICD-10-CM

## 2012-10-29 DIAGNOSIS — Z1211 Encounter for screening for malignant neoplasm of colon: Secondary | ICD-10-CM

## 2012-10-29 DIAGNOSIS — Z0000 Encounter for general adult medical examination without abnormal findings: Secondary | ICD-10-CM

## 2012-10-29 DIAGNOSIS — L700 Acne vulgaris: Secondary | ICD-10-CM | POA: Insufficient documentation

## 2012-10-29 DIAGNOSIS — L723 Sebaceous cyst: Secondary | ICD-10-CM

## 2012-10-29 DIAGNOSIS — E785 Hyperlipidemia, unspecified: Secondary | ICD-10-CM

## 2012-10-29 DIAGNOSIS — I10 Essential (primary) hypertension: Secondary | ICD-10-CM

## 2012-10-29 LAB — LIPID PANEL
Cholesterol: 190 mg/dL (ref 0–200)
HDL: 58.8 mg/dL (ref >39.00)
LDL Cholesterol: 107 mg/dL — ABNORMAL HIGH (ref 0–99)
Triglycerides: 122 mg/dL (ref 0.0–149.0)
VLDL: 24.4 mg/dL (ref 0.0–40.0)

## 2012-10-29 LAB — COMPREHENSIVE METABOLIC PANEL
Alkaline Phosphatase: 101 U/L (ref 39–117)
BUN: 20 mg/dL (ref 6–23)
CO2: 30 mEq/L (ref 19–32)
Creatinine, Ser: 1 mg/dL (ref 0.4–1.2)
GFR: 59.74 mL/min — ABNORMAL LOW (ref >60.00)
Glucose, Bld: 96 mg/dL (ref 70–99)
Total Bilirubin: 1.1 mg/dL (ref 0.3–1.2)

## 2012-10-29 MED ORDER — AMLODIPINE BESY-BENAZEPRIL HCL 10-40 MG PO CAPS: 1.0000 | ORAL_CAPSULE | Freq: Every day | ORAL | Status: DC

## 2012-10-29 MED ORDER — FLECAINIDE ACETATE 100 MG PO TABS: 100.0000 mg | ORAL_TABLET | Freq: Two times a day (BID) | ORAL | Status: DC

## 2012-10-29 MED ORDER — LEVOTHYROXINE SODIUM 25 MCG PO TABS: 25.0000 ug | ORAL_TABLET | Freq: Every day | ORAL | Status: DC

## 2012-10-29 MED ORDER — WARFARIN SODIUM 5 MG PO TABS: 5.0000 mg | ORAL_TABLET | ORAL | Status: DC

## 2012-10-29 MED ORDER — LANSOPRAZOLE 15 MG PO CPDR: 15.0000 mg | DELAYED_RELEASE_CAPSULE | Freq: Every day | ORAL | Status: DC

## 2012-10-29 MED ORDER — SIMVASTATIN 20 MG PO TABS: 10.0000 mg | ORAL_TABLET | Freq: Every evening | ORAL | Status: DC

## 2012-10-29 MED ORDER — METOPROLOL TARTRATE 50 MG PO TABS: 50.0000 mg | ORAL_TABLET | Freq: Two times a day (BID) | ORAL | Status: DC

## 2012-10-29 MED ORDER — FUROSEMIDE 40 MG PO TABS: 40.0000 mg | ORAL_TABLET | Freq: Every day | ORAL | Status: DC

## 2012-10-29 NOTE — Assessment & Plan Note (Signed)
I have personally reviewed the Medicare Annual Wellness questionnaire and have noted 1. The patient's medical and social history 2. Their use of alcohol, tobacco or illicit drugs 3. Their current medications and supplements 4. The patient's functional ability including ADL's, fall risks, home safety risks and hearing or visual impairment. 5. Diet and physical activity 6. Evidence for depression or mood disorders The patients weight, height, BMI have been recorded in the chart.  Hearing and vision has been addressed. I have made referrals, counseling and provided education to the patient based review of the above and I have provided the pt with a written personalized care plan for preventive services. See scanned questionairre. Advanced directives discussed: would want son to be HCPOA - planning on seeing attorney to work on this  Reviewed preventative protocols and updated unless pt declined. States had pneumovax at prior PCPs, will bring in copy of this immunization for our records. Will schedule mammogram - see below Will schedule dexa scan. iFOB today.

## 2012-10-29 NOTE — Assessment & Plan Note (Signed)
Regular today. Continue coumadin, flecainide, metoprolol.
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
## 2012-10-29 NOTE — Progress Notes (Signed)
Subjective:    Patient ID: Mckenzie Frank, female    DOB: July 19, 1941, 72 y.o.   MRN: [id number]  HPI CC: medicare wellness visit  Marvel Plan on right nose bothering her.  Has been bleeding some.  H/o afib on flecainide, coumadin, metoprolol. Hypothyroid - on low dose synthroid.  Last year had breast mass L s/p mammogram and [place], ultimately normal biopsy. Thought trauma related mass. Rpt mammogram this year stable per pt (02/2012 at [facility] but no records available).  Hearing screen failed - sometimes notices need to increase volume on TV.  Declines audiology eval currently. Passes vision screen. Denies depression/anhedonia. No falls in last year.  Preventative:  Well woman with prior PCP  H/o partial hysterectomy (1971) due to heavy bleeding Mammogram 02/2012 per pt normal. Colonoscopy - 2005 normal per pt.  iFOB today. Tetanus - 2010 Flu shot - 2013 Pneumovax - per pt done ~2012 zostavax - 10/2010 Bone density - unsure if has had done Advanced directives - thinks would want son Dannielle Huh to be HCPOA.  Caffeine: 1 cup coffee/day  Lives alone in senior living. D[place] in [place]  Occupation: retired, Engineering geologist  Edu: HS  Activity: walks the track  Diet: good water, fruits/vegetables daily  Medications and allergies reviewed and updated in chart.  Past histories reviewed and updated if relevant as below. Patient Active Problem List  Diagnosis  . Atrial fibrillation  . Hyperlipidemia  . Hypertension  . Hypothyroidism  . Knee joint replacement status  . Breast mass  . Benign head tremor  . Insomnia   Past Medical History  Diagnosis Date  . Hyperlipidemia   . Hypertension   . Hypothyroidism     borderline  . Osteoarthritis of knee     s/p replacement  . Breast mass     left  . Benign head tremor   . Atrial fibrillation 2010    s/p DC cardioversion. on coumadin  . Insomnia    Past Surgical History  Procedure Laterality Date  . Replacement total knee  2005   bilateral  . Vaginal hysterectomy  1971    partial?  . Appendectomy  1947  . Dilation and curettage of uterus    . Cholecystectomy  1980  . Cardiac catheterization      [facility];Dr. Juliann Pares   History  Substance Use Topics  . Smoking status: Never Smoker   . Smokeless tobacco: Never Used  . Alcohol Use: No   Family History  Problem Relation Age of Onset  . CAD Mother     angina  . Cancer Paternal Grandfather     ?  Marland Kitchen Alzheimer's disease Father     died from PNA  . Diabetes Mother   . Stroke Mother 48   Allergies  Allergen Reactions  . Clindamycin/Lincomycin   . Codeine   . Doxycycline   . Paxil (Paroxetine Hcl)   . Promethazine   . Sulfa Antibiotics   . Valium    Current Outpatient Prescriptions on File Prior to Visit  Medication Sig Dispense Refill  . amLODipine-benazepril (LOTREL) 10-40 MG per capsule Take 1 capsule by mouth daily.  30 capsule  11  . clonazePAM (KLONOPIN) 0.5 MG tablet Take 1 tablet (0.5 mg total) by mouth at bedtime.  30 tablet  0  . flecainide (TAMBOCOR) 100 MG tablet Take 1 tablet (100 mg total) by mouth 2 (two) times daily.  60 tablet  3  . furosemide (LASIX) 40 MG tablet Take 40 mg by mouth daily. 2nd  dose PRN      . lansoprazole (PREVACID) 15 MG capsule Take 15 mg by mouth daily.        Marland Kitchen levothyroxine (SYNTHROID, LEVOTHROID) 25 MCG tablet Take 1 tablet (25 mcg total) by mouth daily.  30 tablet  3  . metoprolol (LOPRESSOR) 50 MG tablet Take 50 mg by mouth 2 (two) times daily.        . simvastatin (ZOCOR) 20 MG tablet Take 10 mg by mouth every evening.      . warfarin (COUMADIN) 5 MG tablet Take 5 mg by mouth as directed.        No current facility-administered medications on file prior to visit.     Review of Systems  Constitutional: Negative for fever, chills, activity change, appetite change, fatigue and unexpected weight change.  HENT: Negative for hearing loss and neck pain.   Eyes: Negative for visual disturbance.  Respiratory: Negative  for cough, chest tightness, shortness of breath and wheezing.   Cardiovascular: Negative for chest pain, palpitations and leg swelling.  Gastrointestinal: Negative for nausea, vomiting, abdominal pain, diarrhea, constipation, blood in stool and abdominal distention.  Genitourinary: Negative for hematuria and difficulty urinating.  Musculoskeletal: Negative for myalgias and arthralgias.  Skin: Negative for rash.  Neurological: Negative for dizziness, seizures, syncope and headaches.  Hematological: Bruises/bleeds easily (from coumadin).  Psychiatric/Behavioral: Negative for dysphoric mood. The patient is not nervous/anxious.        Objective:   Physical Exam  Nursing note and vitals reviewed. Constitutional: She is oriented to person, place, and time. She appears well-developed and well-nourished. No distress.  HENT:  Head: Normocephalic and atraumatic.  Right Ear: Hearing, tympanic membrane, external ear and ear canal normal.  Left Ear: Hearing, tympanic membrane, external ear and ear canal normal.  Nose: Nose normal.  Mouth/Throat: Oropharynx is clear and moist. No oropharyngeal exudate.  Eyes: Conjunctivae and EOM are normal. Pupils are equal, round, and reactive to light. No scleral icterus.  Neck: Normal range of motion. Neck supple. Carotid bruit is not present. No thyromegaly present.  Cardiovascular: Normal rate, regular rhythm, normal heart sounds and intact distal pulses.   No murmur heard. Pulses:      Radial pulses are 2+ on the right side, and 2+ on the left side.  Pulmonary/Chest: Effort normal and breath sounds normal. No respiratory distress. She has no wheezes. She has no rales. Right breast exhibits no inverted nipple, no mass, no nipple discharge, no skin change and no tenderness. Left breast exhibits mass (small lump L breast around 8oclock, nontender). Left breast exhibits no inverted nipple, no nipple discharge, no skin change and no tenderness. Breasts are  symmetrical.  Abdominal: Soft. Bowel sounds are normal. She exhibits no distension and no mass. There is no tenderness. There is no rebound and no guarding.  Musculoskeletal: Normal range of motion. She exhibits no edema.  Lymphadenopathy:    She has no cervical adenopathy.    She has no axillary adenopathy.       Right axillary: No lateral adenopathy present.       Left axillary: No lateral adenopathy present.      Right: No supraclavicular adenopathy present.       Left: No supraclavicular adenopathy present.  Neurological: She is alert and oriented to person, place, and time.  CN grossly intact, station and gait intact  Skin: Skin is warm and dry. No rash noted.  Psychiatric: She has a normal mood and affect. Her behavior is normal. Judgment  and thought content normal.       Assessment & Plan:

## 2012-10-29 NOTE — Assessment & Plan Note (Signed)
Chronic, stable. Continue meds. 

## 2012-10-29 NOTE — Assessment & Plan Note (Signed)
Chronic. Check FLP today.  

## 2012-10-29 NOTE — Patient Instructions (Addendum)
Bring in record of pneumonia shot for chart. Stool kit today. Pass by Marion's office to schedule bone density scan and diagnostic mammogram. Blood work today. Good to see you today, call us with questions. Return as needed or in 1 year for next physical.

## 2012-10-29 NOTE — Assessment & Plan Note (Signed)
On R nose. Recommended warm compresses. If not improved, notify us.

## 2012-10-30 ENCOUNTER — Encounter: Payer: Self-pay | Admitting: *Deleted

## 2012-10-31 HISTORY — PX: OTHER SURGICAL HISTORY: SHX169

## 2012-11-09 ENCOUNTER — Other Ambulatory Visit: Payer: Medicare Other

## 2012-11-09 ENCOUNTER — Other Ambulatory Visit: Payer: Self-pay | Admitting: *Deleted

## 2012-11-09 MED ORDER — FLECAINIDE ACETATE 100 MG PO TABS: 100.0000 mg | ORAL_TABLET | Freq: Two times a day (BID) | ORAL | Status: DC

## 2012-11-10 ENCOUNTER — Other Ambulatory Visit: Payer: Self-pay | Admitting: *Deleted

## 2012-11-10 MED ORDER — AMLODIPINE BESY-BENAZEPRIL HCL 10-40 MG PO CAPS: 1.0000 | ORAL_CAPSULE | Freq: Every day | ORAL | Status: DC

## 2012-11-10 MED ORDER — FUROSEMIDE 40 MG PO TABS: 40.0000 mg | ORAL_TABLET | Freq: Every day | ORAL | Status: DC

## 2012-11-10 MED ORDER — SIMVASTATIN 20 MG PO TABS: 10.0000 mg | ORAL_TABLET | Freq: Every evening | ORAL | Status: DC

## 2012-11-10 MED ORDER — WARFARIN SODIUM 5 MG PO TABS: 5.0000 mg | ORAL_TABLET | ORAL | Status: DC

## 2012-11-10 MED ORDER — LANSOPRAZOLE 15 MG PO CPDR: 15.0000 mg | DELAYED_RELEASE_CAPSULE | Freq: Every day | ORAL | Status: DC

## 2012-11-10 MED ORDER — METOPROLOL TARTRATE 50 MG PO TABS: 50.0000 mg | ORAL_TABLET | Freq: Two times a day (BID) | ORAL | Status: DC

## 2012-11-10 MED ORDER — FLECAINIDE ACETATE 100 MG PO TABS: 100.0000 mg | ORAL_TABLET | Freq: Two times a day (BID) | ORAL | Status: DC

## 2012-11-10 MED ORDER — LEVOTHYROXINE SODIUM 25 MCG PO TABS: 25.0000 ug | ORAL_TABLET | Freq: Every day | ORAL | Status: DC

## 2012-11-10 NOTE — Telephone Encounter (Signed)
Last filled 10/09/12 

## 2012-11-12 ENCOUNTER — Other Ambulatory Visit: Payer: Self-pay

## 2012-11-12 MED ORDER — CLONAZEPAM 0.5 MG PO TABS: 0.5000 mg | ORAL_TABLET | Freq: Every day | ORAL | Status: DC

## 2012-11-12 NOTE — Telephone Encounter (Signed)
plz phone in. 

## 2012-11-12 NOTE — Telephone Encounter (Signed)
[place] pharmacy request refill Clonazepam.Please advise. Pt requested to be able to pick up med today.

## 2012-11-13 ENCOUNTER — Ambulatory Visit (INDEPENDENT_AMBULATORY_CARE_PROVIDER_SITE_OTHER)
Admission: RE | Admit: 2012-11-13 | Discharge: 2012-11-13 | Disposition: A | Discharge status: Home / Self Care | Payer: Medicare Other | Source: Ambulatory Visit | Attending: Family Medicine | Admitting: Family Medicine

## 2012-11-13 DIAGNOSIS — Z780 Asymptomatic menopausal state: Secondary | ICD-10-CM

## 2012-11-13 NOTE — Telephone Encounter (Signed)
Rx called in as directed.   

## 2012-11-16 ENCOUNTER — Ambulatory Visit (INDEPENDENT_AMBULATORY_CARE_PROVIDER_SITE_OTHER): Payer: Medicare Other | Admitting: General Practice

## 2012-11-16 DIAGNOSIS — Z5181 Encounter for therapeutic drug level monitoring: Secondary | ICD-10-CM

## 2012-11-16 DIAGNOSIS — I4891 Unspecified atrial fibrillation: Secondary | ICD-10-CM

## 2012-11-16 DIAGNOSIS — Z7901 Long term (current) use of anticoagulants: Secondary | ICD-10-CM

## 2012-11-18 ENCOUNTER — Ambulatory Visit
Admission: RE | Admit: 2012-11-18 | Discharge: 2012-11-18 | Disposition: A | Discharge status: Home / Self Care | Payer: Medicare Other | Source: Ambulatory Visit | Attending: Family Medicine | Admitting: Family Medicine

## 2012-11-18 DIAGNOSIS — N63 Unspecified lump in unspecified breast: Secondary | ICD-10-CM

## 2012-11-19 ENCOUNTER — Encounter: Payer: Self-pay | Admitting: *Deleted

## 2012-11-24 ENCOUNTER — Other Ambulatory Visit: Payer: Medicare Other

## 2012-11-24 ENCOUNTER — Encounter: Payer: Self-pay | Admitting: *Deleted

## 2012-11-24 ENCOUNTER — Encounter: Payer: Self-pay | Admitting: Family Medicine

## 2012-11-24 DIAGNOSIS — Z1211 Encounter for screening for malignant neoplasm of colon: Secondary | ICD-10-CM

## 2012-11-24 LAB — FECAL OCCULT BLOOD, GUAIAC: Fecal Occult Blood: NEGATIVE

## 2012-11-30 ENCOUNTER — Ambulatory Visit (INDEPENDENT_AMBULATORY_CARE_PROVIDER_SITE_OTHER): Payer: Medicare Other | Admitting: Cardiovascular Disease

## 2012-11-30 ENCOUNTER — Encounter: Payer: Self-pay | Admitting: Cardiovascular Disease

## 2012-11-30 VITALS — BP 152/74 | HR 58 | Ht 67.5 in | Wt 281.0 lb

## 2012-11-30 DIAGNOSIS — I059 Rheumatic mitral valve disease, unspecified: Secondary | ICD-10-CM

## 2012-11-30 DIAGNOSIS — I340 Nonrheumatic mitral (valve) insufficiency: Secondary | ICD-10-CM

## 2012-11-30 DIAGNOSIS — I4891 Unspecified atrial fibrillation: Secondary | ICD-10-CM

## 2012-11-30 DIAGNOSIS — I10 Essential (primary) hypertension: Secondary | ICD-10-CM

## 2012-11-30 NOTE — Patient Instructions (Addendum)
Your physician has requested that you have an echocardiogram. Echocardiography is a painless test that uses sound waves to create images of your heart. It provides your doctor with information about the size and shape of your heart and how well your heart's chambers and valves are working. This procedure takes approximately one hour. There are no restrictions for this procedure.  Follow up in 6 months.

## 2012-12-02 ENCOUNTER — Encounter: Payer: Self-pay | Admitting: Cardiovascular Disease

## 2012-12-02 DIAGNOSIS — I340 Nonrheumatic mitral (valve) insufficiency: Secondary | ICD-10-CM | POA: Insufficient documentation

## 2012-12-02 NOTE — Assessment & Plan Note (Signed)
Her blood pressure is mildly elevated today. It seems to fluctuate. Will monitor.

## 2012-12-02 NOTE — Assessment & Plan Note (Signed)
The patient is maintaining in sinus rhythm with flecainide with no recurrent episodes. She seems to be stable from a cardiac standpoint. She is slightly bradycardic but overall is asymptomatic. Thus, I will continue the same dose flecainide and metoprolol. Continue long term  anticoagulation. Will schedule a follow up echocardiogram to ensure structural stability given that she is on a class 1 C antiarrhythmic medication.

## 2012-12-02 NOTE — Progress Notes (Signed)
HPI  [name] who is here today for a follow up visit regarding atrial fibrillation.  She has known history of paroxysmal atrial fibrillation diagnosed in 2010. She had cardioversion done once at that time. It seems that she had recurrent atrial fibrillation in 2011 and was placed on flecainide by Dr. Caro Hight her previous cardiologist.  She reports having a cardiac catheterization many years ago by Dr. Juliann Pares which showed no significant coronary artery disease. Most recent echocardiogram was in 06/2010 which showed normal LVSF, moderate mitral regurgitation and moderate pulmonary hypertension.   She has been doing well. She denies any chest pain or dyspnea. She has no history of congestive heart failure, stroke or diabetes.  Allergies  Allergen Reactions  . Clindamycin/Lincomycin   . Codeine   . Doxycycline   . Paxil (Paroxetine Hcl)   . Promethazine   . Sulfa Antibiotics   . Valium      Current Outpatient Prescriptions on File Prior to Visit  Medication Sig Dispense Refill  . amLODipine-benazepril (LOTREL) 10-40 MG per capsule Take 1 capsule by mouth daily.  90 capsule  3  . clonazePAM (KLONOPIN) 0.5 MG tablet Take 1 tablet (0.5 mg total) by mouth at bedtime.  30 tablet  0  . flecainide (TAMBOCOR) 100 MG tablet Take 1 tablet (100 mg total) by mouth 2 (two) times daily.  180 tablet  3  . lansoprazole (PREVACID) 15 MG capsule Take 1 capsule (15 mg total) by mouth daily.  90 capsule  3  . levothyroxine (SYNTHROID, LEVOTHROID) 25 MCG tablet Take 1 tablet (25 mcg total) by mouth daily.  90 tablet  3  . metoprolol (LOPRESSOR) 50 MG tablet Take 1 tablet (50 mg total) by mouth 2 (two) times daily.  180 tablet  3  . simvastatin (ZOCOR) 20 MG tablet Take 0.5 tablets (10 mg total) by mouth every evening.  45 tablet  3  . warfarin (COUMADIN) 5 MG tablet Take 1 tablet (5 mg total) by mouth as directed.  90 tablet  3   No current facility-administered medications on file prior  to visit.     Past Medical History  Diagnosis Date  . Hyperlipidemia   . Hypertension   . Hypothyroidism     borderline  . Osteoarthritis of knee     s/p replacement  . Breast mass     left  . Benign head tremor   . Atrial fibrillation 2010    s/p DC cardioversion. on coumadin  . Insomnia      Past Surgical History  Procedure Laterality Date  . Replacement total knee  2005    bilateral  . Vaginal hysterectomy  1971    partial?  . Appendectomy  1947  . Dilation and curettage of uterus    . Cholecystectomy  1980  . Cardiac catheterization      [facility];Dr. Juliann Pares     Family History  Problem Relation Age of Onset  . CAD Mother     angina  . Cancer Paternal Grandfather     ?  Marland Kitchen Alzheimer's disease Father     died from PNA  . Diabetes Mother   . Stroke Mother 24     History   Social History  . Marital Status: Widowed    Spouse Name: N/A    Number of Children: N/A  . Years of Education: N/A   Occupational History  . Not on file.   Social History Main Topics  . Smoking  status: Never Smoker   . Smokeless tobacco: Never Used  . Alcohol Use: No  . Drug Use: No  . Sexually Active: Not on file   Other Topics Concern  . Not on file   Social History Narrative   Caffeine: 1 cup coffee/day   Lives alone in senior living.  Daughter in [place]   Occupation: retired, Engineering geologist   Edu: HS   Activity: walks the track   Diet:      PHYSICAL EXAM   BP 152/74  Pulse 58  Ht 5' 7.5" (1.715 m)  Wt 281 lb (127.461 kg)  BMI 43.34 kg/m2 Constitutional: She is oriented to person, place, and time. She appears well-developed and well-nourished. No distress.  HENT: No nasal discharge.  Head: Normocephalic and atraumatic.  Eyes: Pupils are equal and round. Right eye exhibits no discharge. Left eye exhibits no discharge.  Neck: Normal range of motion. Neck supple. No JVD present. No thyromegaly present.  Cardiovascular: Normal rate, regular rhythm, normal heart sounds.  Exam reveals no gallop and no friction rub. No murmur heard.  Pulmonary/Chest: Effort normal and breath sounds normal. No stridor. No respiratory distress. She has no wheezes. She has no rales. She exhibits no tenderness.  Abdominal: Soft. Bowel sounds are normal. She exhibits no distension. There is no tenderness. There is no rebound and no guarding.  Musculoskeletal: Normal range of motion. She exhibits no edema and no tenderness.  Neurological: She is alert and oriented to person, place, and time. Coordination normal.  Skin: Skin is warm and dry. No rash noted. She is not diaphoretic. No erythema. No pallor.  Psychiatric: She has a normal mood and affect. Her behavior is normal. Judgment and thought content normal.     EKG: Sinus  Bradycardia  -Nonspecific QRS widening.   BORDERLINE   ASSESSMENT AND PLAN

## 2012-12-02 NOTE — Assessment & Plan Note (Signed)
Most recent evaluation in 2011 showed moderate MR with moderate pulmonary hypertension. Will get a follow up echo.

## 2012-12-10 ENCOUNTER — Encounter: Payer: Self-pay | Admitting: Family Medicine

## 2012-12-10 ENCOUNTER — Other Ambulatory Visit: Payer: Self-pay | Admitting: *Deleted

## 2012-12-10 MED ORDER — CLONAZEPAM 0.5 MG PO TABS: 0.5000 mg | ORAL_TABLET | Freq: Every day | ORAL | Status: DC

## 2012-12-10 NOTE — Telephone Encounter (Signed)
plz phone in. 

## 2012-12-10 NOTE — Telephone Encounter (Signed)
Ok to refill 

## 2012-12-11 ENCOUNTER — Encounter: Payer: Self-pay | Admitting: *Deleted

## 2012-12-11 NOTE — Telephone Encounter (Signed)
Rx called in as directed.   

## 2012-12-14 ENCOUNTER — Ambulatory Visit (INDEPENDENT_AMBULATORY_CARE_PROVIDER_SITE_OTHER): Payer: Medicare Other | Admitting: General Practice

## 2012-12-14 DIAGNOSIS — Z5181 Encounter for therapeutic drug level monitoring: Secondary | ICD-10-CM

## 2012-12-14 DIAGNOSIS — Z7901 Long term (current) use of anticoagulants: Secondary | ICD-10-CM

## 2012-12-14 DIAGNOSIS — I4891 Unspecified atrial fibrillation: Secondary | ICD-10-CM

## 2012-12-14 LAB — POCT INR: INR: 2.3

## 2012-12-17 ENCOUNTER — Other Ambulatory Visit: Payer: Self-pay

## 2012-12-17 ENCOUNTER — Other Ambulatory Visit (INDEPENDENT_AMBULATORY_CARE_PROVIDER_SITE_OTHER): Payer: Medicare Other

## 2012-12-17 DIAGNOSIS — I4891 Unspecified atrial fibrillation: Secondary | ICD-10-CM

## 2012-12-21 ENCOUNTER — Encounter: Payer: Self-pay | Admitting: *Deleted

## 2012-12-21 NOTE — Progress Notes (Signed)
Pt informed of echo results.

## 2013-01-08 ENCOUNTER — Other Ambulatory Visit: Payer: Self-pay | Admitting: Family Medicine

## 2013-01-08 MED ORDER — CLONAZEPAM 0.5 MG PO TABS: 0.5000 mg | ORAL_TABLET | Freq: Every day | ORAL | Status: DC

## 2013-01-08 NOTE — Telephone Encounter (Signed)
plz phone in. 

## 2013-01-08 NOTE — Telephone Encounter (Signed)
Last filled 12/11/12.

## 2013-01-11 ENCOUNTER — Ambulatory Visit (INDEPENDENT_AMBULATORY_CARE_PROVIDER_SITE_OTHER): Payer: Medicare Other | Admitting: General Practice

## 2013-01-11 DIAGNOSIS — Z5181 Encounter for therapeutic drug level monitoring: Secondary | ICD-10-CM

## 2013-01-11 DIAGNOSIS — Z7901 Long term (current) use of anticoagulants: Secondary | ICD-10-CM

## 2013-01-11 DIAGNOSIS — I4891 Unspecified atrial fibrillation: Secondary | ICD-10-CM

## 2013-01-11 LAB — POCT INR: INR: 2.5

## 2013-01-11 NOTE — Telephone Encounter (Signed)
Rx called in as directed.   

## 2013-02-08 ENCOUNTER — Ambulatory Visit (INDEPENDENT_AMBULATORY_CARE_PROVIDER_SITE_OTHER): Payer: Medicare Other | Admitting: Family Medicine

## 2013-02-08 DIAGNOSIS — I4891 Unspecified atrial fibrillation: Secondary | ICD-10-CM

## 2013-02-08 DIAGNOSIS — Z7901 Long term (current) use of anticoagulants: Secondary | ICD-10-CM

## 2013-02-08 DIAGNOSIS — Z5181 Encounter for therapeutic drug level monitoring: Secondary | ICD-10-CM

## 2013-02-09 ENCOUNTER — Other Ambulatory Visit: Payer: Self-pay | Admitting: *Deleted

## 2013-02-09 MED ORDER — CLONAZEPAM 0.5 MG PO TABS: 0.5000 mg | ORAL_TABLET | Freq: Every day | ORAL | Status: DC

## 2013-02-09 NOTE — Telephone Encounter (Signed)
Rx called in as directed.   

## 2013-02-09 NOTE — Telephone Encounter (Signed)
Received faxed refill request from pharmacy.  Last office visit 10/29/12. Is it okay to refill?

## 2013-02-09 NOTE — Telephone Encounter (Signed)
plz phone in. 

## 2013-03-15 ENCOUNTER — Other Ambulatory Visit: Payer: Self-pay | Admitting: Family Medicine

## 2013-03-16 NOTE — Telephone Encounter (Signed)
plz phone in. 

## 2013-03-16 NOTE — Telephone Encounter (Signed)
Rx called in as directed.   

## 2013-03-22 ENCOUNTER — Ambulatory Visit (INDEPENDENT_AMBULATORY_CARE_PROVIDER_SITE_OTHER): Payer: Self-pay | Admitting: Family Medicine

## 2013-03-22 DIAGNOSIS — I4891 Unspecified atrial fibrillation: Secondary | ICD-10-CM

## 2013-03-22 DIAGNOSIS — Z7901 Long term (current) use of anticoagulants: Secondary | ICD-10-CM

## 2013-03-22 DIAGNOSIS — Z5181 Encounter for therapeutic drug level monitoring: Secondary | ICD-10-CM

## 2013-03-22 LAB — POCT INR: INR: 2.6

## 2013-04-11 ENCOUNTER — Emergency Department: Payer: Self-pay | Admitting: Emergency Medicine

## 2013-04-28 ENCOUNTER — Other Ambulatory Visit: Payer: Self-pay | Admitting: *Deleted

## 2013-04-28 MED ORDER — FUROSEMIDE 40 MG PO TABS: 40.0000 mg | ORAL_TABLET | Freq: Every day | ORAL | Status: DC

## 2013-04-28 MED ORDER — WARFARIN SODIUM 5 MG PO TABS: ORAL_TABLET | ORAL | Status: DC

## 2013-05-06 ENCOUNTER — Ambulatory Visit (INDEPENDENT_AMBULATORY_CARE_PROVIDER_SITE_OTHER): Payer: Medicare Other | Admitting: Family Medicine

## 2013-05-06 ENCOUNTER — Other Ambulatory Visit: Payer: Self-pay | Admitting: Family Medicine

## 2013-05-06 DIAGNOSIS — Z7901 Long term (current) use of anticoagulants: Secondary | ICD-10-CM

## 2013-05-06 DIAGNOSIS — Z5181 Encounter for therapeutic drug level monitoring: Secondary | ICD-10-CM

## 2013-05-06 DIAGNOSIS — I4891 Unspecified atrial fibrillation: Secondary | ICD-10-CM

## 2013-05-06 NOTE — Telephone Encounter (Signed)
plz phone in. 

## 2013-05-06 NOTE — Telephone Encounter (Signed)
Rx called in as directed.   

## 2013-05-27 ENCOUNTER — Other Ambulatory Visit: Payer: Self-pay | Admitting: *Deleted

## 2013-05-27 MED ORDER — LEVOTHYROXINE SODIUM 25 MCG PO TABS: 25.0000 ug | ORAL_TABLET | Freq: Every day | ORAL | Status: DC

## 2013-05-28 ENCOUNTER — Encounter: Payer: Self-pay | Admitting: Cardiovascular Disease

## 2013-05-28 ENCOUNTER — Ambulatory Visit (INDEPENDENT_AMBULATORY_CARE_PROVIDER_SITE_OTHER): Payer: Medicare Other | Admitting: Cardiovascular Disease

## 2013-05-28 VITALS — BP 144/77 | HR 63 | Ht 67.5 in | Wt 289.2 lb

## 2013-05-28 DIAGNOSIS — I4891 Unspecified atrial fibrillation: Secondary | ICD-10-CM

## 2013-05-28 DIAGNOSIS — I10 Essential (primary) hypertension: Secondary | ICD-10-CM

## 2013-05-28 NOTE — Progress Notes (Signed)
HPI  [name] who is here today for a follow up visit regarding atrial fibrillation.  She has known history of paroxysmal atrial fibrillation diagnosed in 2010. She had cardioversion done once at that time. It seems that she had recurrent atrial fibrillation in 2011 and was placed on flecainide by Dr. Caro Hight her previous cardiologist.  She reports having a cardiac catheterization many years ago by Dr. Juliann Pares which showed no significant coronary artery disease.  She has been doing reasonably well and denies any chest pain or dyspnea. She gets very brief episodes of palpitations. These are not frequent. She has been having diarrhea and abdominal cramps for a few days. She noticed very small amount of blood after she wiped herself. No dizziness. Echocardiogram in April of 2014 showed normal LV systolic function, mild left ventricular hypertrophy and only mild mitral regurgitation.   Allergies  Allergen Reactions  . Clindamycin/Lincomycin   . Codeine   . Doxycycline   . Paxil [Paroxetine Hcl]   . Promethazine   . Sulfa Antibiotics   . Valium      Current Outpatient Prescriptions on File Prior to Visit  Medication Sig Dispense Refill  . amLODipine-benazepril (LOTREL) 10-40 MG per capsule Take 1 capsule by mouth daily.  90 capsule  3  . flecainide (TAMBOCOR) 100 MG tablet Take 1 tablet (100 mg total) by mouth 2 (two) times daily.  180 tablet  3  . furosemide (LASIX) 40 MG tablet Take 1 tablet (40 mg total) by mouth daily. 2nd dose prn  90 tablet  1  . lansoprazole (PREVACID) 15 MG capsule Take 1 capsule (15 mg total) by mouth daily.  90 capsule  3  . levothyroxine (SYNTHROID, LEVOTHROID) 25 MCG tablet Take 1 tablet (25 mcg total) by mouth daily.  90 tablet  1  . metoprolol (LOPRESSOR) 50 MG tablet Take 1 tablet (50 mg total) by mouth 2 (two) times daily.  180 tablet  3  . simvastatin (ZOCOR) 20 MG tablet Take 0.5 tablets (10 mg total) by mouth every evening.  45 tablet  3   . warfarin (COUMADIN) 5 MG tablet Take as directed by [facility].  90 tablet  0   No current facility-administered medications on file prior to visit.     Past Medical History  Diagnosis Date  . Hyperlipidemia   . Hypertension   . Hypothyroidism     borderline  . Osteoarthritis of knee     s/p replacement  . Breast mass     left  . Benign head tremor   . Atrial fibrillation 2010    s/p DC cardioversion. on coumadin  . Insomnia      Past Surgical History  Procedure Laterality Date  . Replacement total knee  2005    bilateral  . Vaginal hysterectomy  1971    partial?  . Appendectomy  1947  . Dilation and curettage of uterus    . Cholecystectomy  1980  . Cardiac catheterization      [facility];Dr. Juliann Pares  . Dexa  10/2012    WNL     Family History  Problem Relation Age of Onset  . CAD Mother     angina  . Cancer Paternal Grandfather     ?  Marland Kitchen Alzheimer's disease Father     died from PNA  . Diabetes Mother   . Stroke Mother 33     History   Social History  . Marital Status: Widowed    Spouse  Name: N/A    Number of Children: N/A  . Years of Education: N/A   Occupational History  . Not on file.   Social History Main Topics  . Smoking status: Never Smoker   . Smokeless tobacco: Never Used  . Alcohol Use: No  . Drug Use: No  . Sexual Activity: Not on file   Other Topics Concern  . Not on file   Social History Narrative   Caffeine: 1 cup coffee/day   Lives alone in senior living.  Daughter in [place]   Occupation: retired, Engineering geologist   Edu: HS   Activity: walks the track   Diet:      PHYSICAL EXAM   BP 144/77  Pulse 63  Ht 5' 7.5" (1.715 m)  Wt 289 lb 4 oz (131.203 kg)  BMI 44.61 kg/m2 Constitutional: She is oriented to person, place, and time. She appears well-developed and well-nourished. No distress.  HENT: No nasal discharge.  Head: Normocephalic and atraumatic.  Eyes: Pupils are equal and round. Right eye exhibits no discharge. Left  eye exhibits no discharge.  Neck: Normal range of motion. Neck supple. No JVD present. No thyromegaly present.  Cardiovascular: Normal rate, regular rhythm, normal heart sounds. Exam reveals no gallop and no friction rub. No murmur heard.  Pulmonary/Chest: Effort normal and breath sounds normal. No stridor. No respiratory distress. She has no wheezes. She has no rales. She exhibits no tenderness.  Abdominal: Soft. Bowel sounds are normal. She exhibits no distension. There is no tenderness. There is no rebound and no guarding.  Musculoskeletal: Normal range of motion. She exhibits no edema and no tenderness.  Neurological: She is alert and oriented to person, place, and time. Coordination normal.  Skin: Skin is warm and dry. No rash noted. She is not diaphoretic. No erythema. No pallor.  Psychiatric: She has a normal mood and affect. Her behavior is normal. Judgment and thought content normal.     EKG: Sinus  Rhythm  -Nonspecific QRS widening.   BORDERLINE    ASSESSMENT AND PLAN

## 2013-05-28 NOTE — Patient Instructions (Addendum)
Continue same medications.  Call us if you experience more blood in stool.  Follow up in 6 moths.

## 2013-05-28 NOTE — Assessment & Plan Note (Signed)
Blood pressure is reasonably controlled. 

## 2013-05-28 NOTE — Assessment & Plan Note (Signed)
She is maintaining in sinus rhythm with flecainide. I advised her to notify us if she notices any more blood in the stool. Continue long-term anticoagulation with warfarin.

## 2013-06-17 ENCOUNTER — Ambulatory Visit (INDEPENDENT_AMBULATORY_CARE_PROVIDER_SITE_OTHER): Payer: Medicare Other | Admitting: Family Medicine

## 2013-06-17 DIAGNOSIS — I4891 Unspecified atrial fibrillation: Secondary | ICD-10-CM

## 2013-06-17 DIAGNOSIS — Z7901 Long term (current) use of anticoagulants: Secondary | ICD-10-CM

## 2013-06-17 DIAGNOSIS — Z5181 Encounter for therapeutic drug level monitoring: Secondary | ICD-10-CM

## 2013-06-23 ENCOUNTER — Other Ambulatory Visit: Payer: Self-pay

## 2013-06-23 MED ORDER — CLONAZEPAM 0.5 MG PO TABS: 0.5000 mg | ORAL_TABLET | Freq: Every day | ORAL | Status: DC

## 2013-06-23 NOTE — Telephone Encounter (Signed)
Rx called in as directed.   

## 2013-06-23 NOTE — Telephone Encounter (Signed)
plz phone in. 

## 2013-06-23 NOTE — Telephone Encounter (Signed)
Pt had left papers from [place] thinking she needed refills on several meds but on pts med list she has refills thru next year. Pt voiced understanding and requested refill on clonazepam to [facility] pharmacy.Please advise.

## 2013-07-23 ENCOUNTER — Other Ambulatory Visit: Payer: Self-pay | Admitting: Family Medicine

## 2013-07-23 ENCOUNTER — Encounter: Payer: Self-pay | Admitting: Radiology

## 2013-07-23 NOTE — Telephone Encounter (Signed)
Rx called in as directed.   

## 2013-07-23 NOTE — Telephone Encounter (Signed)
plz phone in. 

## 2013-07-23 NOTE — Telephone Encounter (Signed)
Ok to refill 

## 2013-07-26 ENCOUNTER — Ambulatory Visit (INDEPENDENT_AMBULATORY_CARE_PROVIDER_SITE_OTHER): Payer: Medicare Other | Admitting: Family Medicine

## 2013-07-26 DIAGNOSIS — Z7901 Long term (current) use of anticoagulants: Secondary | ICD-10-CM

## 2013-07-26 DIAGNOSIS — Z5181 Encounter for therapeutic drug level monitoring: Secondary | ICD-10-CM

## 2013-07-26 DIAGNOSIS — I4891 Unspecified atrial fibrillation: Secondary | ICD-10-CM

## 2013-08-02 DIAGNOSIS — Z8719 Personal history of other diseases of the digestive system: Secondary | ICD-10-CM

## 2013-08-02 HISTORY — PX: PERCUTANEOUS NEPHROSTOMY: SHX2208

## 2013-08-02 HISTORY — DX: Personal history of other diseases of the digestive system: Z87.19

## 2013-08-06 ENCOUNTER — Other Ambulatory Visit: Payer: Self-pay | Admitting: *Deleted

## 2013-08-06 MED ORDER — WARFARIN SODIUM 5 MG PO TABS: ORAL_TABLET | ORAL | Status: DC

## 2013-08-06 MED ORDER — METOPROLOL TARTRATE 50 MG PO TABS: 50.0000 mg | ORAL_TABLET | Freq: Two times a day (BID) | ORAL | Status: DC

## 2013-08-06 MED ORDER — AMLODIPINE BESY-BENAZEPRIL HCL 10-40 MG PO CAPS: 1.0000 | ORAL_CAPSULE | Freq: Every day | ORAL | Status: DC

## 2013-08-06 MED ORDER — FUROSEMIDE 40 MG PO TABS: 40.0000 mg | ORAL_TABLET | Freq: Every day | ORAL | Status: DC

## 2013-08-06 MED ORDER — SIMVASTATIN 20 MG PO TABS: 10.0000 mg | ORAL_TABLET | Freq: Every evening | ORAL | Status: DC

## 2013-08-06 MED ORDER — LANSOPRAZOLE 15 MG PO CPDR: 15.0000 mg | DELAYED_RELEASE_CAPSULE | Freq: Every day | ORAL | Status: DC

## 2013-08-06 MED ORDER — FLECAINIDE ACETATE 100 MG PO TABS: 100.0000 mg | ORAL_TABLET | Freq: Two times a day (BID) | ORAL | Status: DC

## 2013-08-06 NOTE — Telephone Encounter (Signed)
Ok to refill 

## 2013-08-12 ENCOUNTER — Ambulatory Visit (INDEPENDENT_AMBULATORY_CARE_PROVIDER_SITE_OTHER): Payer: Medicare Other | Admitting: Family Medicine

## 2013-08-12 ENCOUNTER — Encounter (HOSPITAL_COMMUNITY): Payer: Self-pay | Admitting: Emergency Medicine

## 2013-08-12 ENCOUNTER — Telehealth: Payer: Self-pay | Admitting: *Deleted

## 2013-08-12 ENCOUNTER — Ambulatory Visit (HOSPITAL_COMMUNITY)
Admission: RE | Admit: 2013-08-12 | Discharge: 2013-08-12 | Disposition: A | Discharge status: Home / Self Care | Payer: Medicare Other | Source: Ambulatory Visit | Attending: Family Medicine | Admitting: Family Medicine

## 2013-08-12 ENCOUNTER — Inpatient Hospital Stay (HOSPITAL_COMMUNITY)
Admission: EM | Admit: 2013-08-12 | Discharge: 2013-08-17 | DRG: 392 | Disposition: A | Discharge status: Home / Self Care | Payer: Medicare Other | Attending: Internal Medicine | Admitting: Internal Medicine

## 2013-08-12 ENCOUNTER — Encounter: Payer: Self-pay | Admitting: Family Medicine

## 2013-08-12 DIAGNOSIS — R1030 Lower abdominal pain, unspecified: Secondary | ICD-10-CM | POA: Insufficient documentation

## 2013-08-12 DIAGNOSIS — Z79899 Other long term (current) drug therapy: Secondary | ICD-10-CM

## 2013-08-12 DIAGNOSIS — R1032 Left lower quadrant pain: Secondary | ICD-10-CM | POA: Diagnosis present

## 2013-08-12 DIAGNOSIS — R197 Diarrhea, unspecified: Secondary | ICD-10-CM | POA: Diagnosis not present

## 2013-08-12 DIAGNOSIS — N63 Unspecified lump in unspecified breast: Secondary | ICD-10-CM

## 2013-08-12 DIAGNOSIS — Z96659 Presence of unspecified artificial knee joint: Secondary | ICD-10-CM

## 2013-08-12 DIAGNOSIS — D72829 Elevated white blood cell count, unspecified: Secondary | ICD-10-CM

## 2013-08-12 DIAGNOSIS — N179 Acute kidney failure, unspecified: Secondary | ICD-10-CM

## 2013-08-12 DIAGNOSIS — K630 Abscess of intestine: Secondary | ICD-10-CM | POA: Diagnosis present

## 2013-08-12 DIAGNOSIS — Z7901 Long term (current) use of anticoagulants: Secondary | ICD-10-CM

## 2013-08-12 DIAGNOSIS — K5792 Diverticulitis of intestine, part unspecified, without perforation or abscess without bleeding: Secondary | ICD-10-CM

## 2013-08-12 DIAGNOSIS — E039 Hypothyroidism, unspecified: Secondary | ICD-10-CM

## 2013-08-12 DIAGNOSIS — G250 Essential tremor: Secondary | ICD-10-CM

## 2013-08-12 DIAGNOSIS — R109 Unspecified abdominal pain: Secondary | ICD-10-CM

## 2013-08-12 DIAGNOSIS — Z0000 Encounter for general adult medical examination without abnormal findings: Secondary | ICD-10-CM

## 2013-08-12 DIAGNOSIS — I340 Nonrheumatic mitral (valve) insufficiency: Secondary | ICD-10-CM

## 2013-08-12 DIAGNOSIS — G4700 Insomnia, unspecified: Secondary | ICD-10-CM

## 2013-08-12 DIAGNOSIS — L700 Acne vulgaris: Secondary | ICD-10-CM

## 2013-08-12 DIAGNOSIS — M1710 Unilateral primary osteoarthritis, unspecified knee: Secondary | ICD-10-CM | POA: Diagnosis present

## 2013-08-12 DIAGNOSIS — K5732 Diverticulitis of large intestine without perforation or abscess without bleeding: Principal | ICD-10-CM | POA: Diagnosis present

## 2013-08-12 DIAGNOSIS — I4891 Unspecified atrial fibrillation: Secondary | ICD-10-CM

## 2013-08-12 DIAGNOSIS — I10 Essential (primary) hypertension: Secondary | ICD-10-CM

## 2013-08-12 DIAGNOSIS — E785 Hyperlipidemia, unspecified: Secondary | ICD-10-CM

## 2013-08-12 HISTORY — DX: Gastro-esophageal reflux disease without esophagitis: K21.9

## 2013-08-12 LAB — CBC WITH DIFFERENTIAL/PLATELET
Basophils Relative: 0 % (ref 0–1)
Eosinophils Absolute: 0.1 10*3/uL (ref 0.0–0.7)
Lymphs Abs: 1.8 10*3/uL (ref 0.7–4.0)
MCH: 27.8 pg (ref 26.0–34.0)
MCHC: 34.4 g/dL (ref 30.0–36.0)
Neutro Abs: 9.9 10*3/uL — ABNORMAL HIGH (ref 1.7–7.7)
Neutrophils Relative %: 74 % (ref 43–77)
Platelets: 287 10*3/uL (ref 150–400)
RBC: 4.86 MIL/uL (ref 3.87–5.11)

## 2013-08-12 LAB — HEMOCCULT GUIAC POC 1CARD (OFFICE): Fecal Occult Blood, POC: NEGATIVE

## 2013-08-12 LAB — BASIC METABOLIC PANEL
Calcium: 9.1 mg/dL (ref 8.4–10.5)
Sodium: 139 mEq/L (ref 135–145)

## 2013-08-12 MED ORDER — IOHEXOL 300 MG/ML  SOLN
100.0000 mL | Freq: Once | INTRAMUSCULAR | Status: AC | PRN
  Administered 2013-08-12: 100 mL via INTRAVENOUS

## 2013-08-12 NOTE — ED Notes (Signed)
Pt to room from CT via w/c, reported perforated bowel, family with pt, pt alert, NAD, calm, interactive.

## 2013-08-12 NOTE — Addendum Note (Signed)
Addended by: Josph Macho A on: 08/12/2013 06:53 PM   Modules accepted: Orders

## 2013-08-12 NOTE — Patient Instructions (Signed)
I would like to obtain a CT scan tonight to evaluate this abdominal pain. We will call you at [phone] (M)  with results Go to [facility] radiology department to get this done today.

## 2013-08-12 NOTE — Addendum Note (Signed)
Addended by: Josph Macho A on: 08/12/2013 06:51 PM   Modules accepted: Orders

## 2013-08-12 NOTE — Assessment & Plan Note (Addendum)
Concerning exam with guarding lower abdomen, coumadin use. Reassuring negative hemoccult in office however. Given abd pain, will need stat CT tonight to r/o diverticulitis, bleed or other cause of severe sharp abd pain. I will send today to [facility] radiology for stat CT scan abd/pelvis with contrast

## 2013-08-12 NOTE — ED Notes (Addendum)
Pt. reports low abdominal pain for 1 month worse these past several days , seen at her PCP clinic today , CT scan results shows sigmoid diverticulitis with micro perforation and early abscess. Blood test done at clinic.

## 2013-08-12 NOTE — Progress Notes (Signed)
Pre-visit discussion using our clinic review tool. No additional management support is needed unless otherwise documented below in the visit note.  

## 2013-08-12 NOTE — Progress Notes (Signed)
   Subjective:    Patient ID: Mckenzie Frank, female    DOB: May 19, 1941, 72 y.o.   MRN: [id number]  HPI CC:abd pain  1 mo ago had some abdominal discomfort associated with scant blood noted on toilet paper when wiping but no stool.  Now 1d h/o lower abdominal pain described as severe sharp stabbing pain.  She stayed in bed all day yesterday "doubling over in pain".   May have had temperature and nausea. Last BM was today - states this was normal, no blood. This morning when she awoke noticed blood spot on her right abdomen  No chills, vomiting, hematuria, dysuria, urgency or frequency.  No BM changes.  No fever.  Pt endorses normal colonoscopy 10 years ago. Unsure about diverticulosis hx.  Avoids NSAIDs.  No EtOH.  No smoking. S/p hysterectomy, appendectomy and cholecystectomy She is on coumadin for her history of atrial fibrillation.  She sees Dr. Kirke Corin for this. Lab Results  Component Value Date   INR 2.3 07/26/2013   INR 2.1 06/17/2013   INR 2.1 05/06/2013    Past Medical History  Diagnosis Date  . Hyperlipidemia   . Hypertension   . Hypothyroidism     borderline  . Osteoarthritis of knee     s/p replacement  . Breast mass     left  . Benign head tremor   . Atrial fibrillation 2010    s/p DC cardioversion. on coumadin  . Insomnia     Past Surgical History  Procedure Laterality Date  . Replacement total knee  2005    bilateral  . Vaginal hysterectomy  1971    partial?  . Appendectomy  1947  . Dilation and curettage of uterus    . Cholecystectomy  1980  . Cardiac catheterization      [facility];Dr. Juliann Pares  . Dexa  10/2012    WNL   Review of Systems Per HPI    Objective:   Physical Exam  Nursing note and vitals reviewed. Constitutional: She appears well-developed and well-nourished. No distress.  HENT:  Head: Normocephalic and atraumatic.  Mouth/Throat: Oropharynx is clear and moist. No oropharyngeal exudate.  Eyes: Conjunctivae and EOM are normal. Pupils  are equal, round, and reactive to light.  Neck: Normal range of motion. Neck supple.  Cardiovascular: Normal rate, regular rhythm and intact distal pulses.   Murmur (SEM) heard. Pulmonary/Chest: Effort normal and breath sounds normal. No respiratory distress. She has no wheezes. She has no rales.  Abdominal: Soft. Normal appearance and bowel sounds are normal. She exhibits no distension and no mass. There is no hepatosplenomegaly. There is generalized tenderness (max RLQ). There is guarding (RLQ). There is no rigidity, no rebound, no CVA tenderness and negative Murphy's sign.  obese  Musculoskeletal: She exhibits no edema.       Assessment & Plan:

## 2013-08-13 ENCOUNTER — Encounter (HOSPITAL_COMMUNITY): Payer: Self-pay | Admitting: Internal Medicine

## 2013-08-13 ENCOUNTER — Emergency Department (HOSPITAL_COMMUNITY): Payer: Medicare Other

## 2013-08-13 DIAGNOSIS — E039 Hypothyroidism, unspecified: Secondary | ICD-10-CM

## 2013-08-13 DIAGNOSIS — I10 Essential (primary) hypertension: Secondary | ICD-10-CM

## 2013-08-13 DIAGNOSIS — K5732 Diverticulitis of large intestine without perforation or abscess without bleeding: Principal | ICD-10-CM

## 2013-08-13 DIAGNOSIS — I4891 Unspecified atrial fibrillation: Secondary | ICD-10-CM

## 2013-08-13 LAB — CBC WITH DIFFERENTIAL/PLATELET
Basophils Absolute: 0 10*3/uL (ref 0.0–0.1)
Basophils Relative: 1 % (ref 0–1)
Basophils Relative: 0 % (ref 0–1)
Eosinophils Absolute: 0.1 10*3/uL (ref 0.0–0.7)
Eosinophils Absolute: 0.1 10*3/uL (ref 0.0–0.7)
Eosinophils Relative: 1 % (ref 0–5)
HCT: 40.7 % (ref 36.0–46.0)
HCT: 38.8 % (ref 36.0–46.0)
Hemoglobin: 13.8 g/dL (ref 12.0–15.0)
Hemoglobin: 13 g/dL (ref 12.0–15.0)
Lymphocytes Relative: 19 % (ref 12–46)
Lymphs Abs: 2.5 10*3/uL (ref 0.7–4.0)
MCH: 28.3 pg (ref 26.0–34.0)
MCHC: 33.9 g/dL (ref 30.0–36.0)
MCHC: 33.5 g/dL (ref 30.0–36.0)
MCV: 85.7 fL (ref 78.0–100.0)
Monocytes Absolute: 1.1 10*3/uL — ABNORMAL HIGH (ref 0.1–1.0)
Monocytes Absolute: 0.8 10*3/uL (ref 0.1–1.0)
Monocytes Relative: 9 % (ref 3–12)
Monocytes Relative: 8 % (ref 3–12)
Neutro Abs: 9.4 10*3/uL — ABNORMAL HIGH (ref 1.7–7.7)
RBC: 4.75 MIL/uL (ref 3.87–5.11)
RDW: 14.9 % (ref 11.5–15.5)
WBC: 13.2 10*3/uL — ABNORMAL HIGH (ref 4.0–10.5)

## 2013-08-13 LAB — COMPREHENSIVE METABOLIC PANEL
ALT: 22 U/L (ref 0–35)
AST: 25 U/L (ref 0–37)
AST: 22 U/L (ref 0–37)
Albumin: 3.3 g/dL — ABNORMAL LOW (ref 3.5–5.2)
Alkaline Phosphatase: 135 U/L — ABNORMAL HIGH (ref 39–117)
BUN: 25 mg/dL — ABNORMAL HIGH (ref 6–23)
CO2: 27 mEq/L (ref 19–32)
CO2: 24 mEq/L (ref 19–32)
Calcium: 8.5 mg/dL (ref 8.4–10.5)
Chloride: 98 mEq/L (ref 96–112)
Creatinine, Ser: 1.01 mg/dL (ref 0.50–1.10)
GFR calc Af Amer: 62 mL/min — ABNORMAL LOW (ref >90)
GFR calc non Af Amer: 53 mL/min — ABNORMAL LOW (ref >90)
Glucose, Bld: 93 mg/dL (ref 70–99)
Potassium: 3.5 mEq/L (ref 3.5–5.1)
Sodium: 136 mEq/L (ref 135–145)
Sodium: 135 mEq/L (ref 135–145)
Total Bilirubin: 1.3 mg/dL — ABNORMAL HIGH (ref 0.3–1.2)
Total Protein: 6.8 g/dL (ref 6.0–8.3)

## 2013-08-13 LAB — APTT: aPTT: 50 seconds — ABNORMAL HIGH (ref 24–37)

## 2013-08-13 LAB — GLUCOSE, CAPILLARY: Glucose-Capillary: 101 mg/dL — ABNORMAL HIGH (ref 70–99)

## 2013-08-13 LAB — TYPE AND SCREEN: ABO/RH(D): AB POS

## 2013-08-13 LAB — PROTIME-INR
INR: 1.7 — ABNORMAL HIGH (ref 0.00–1.49)
Prothrombin Time: 19.7 seconds — ABNORMAL HIGH (ref 11.6–15.2)

## 2013-08-13 LAB — TROPONIN I: Troponin I: <0.3 ng/mL (ref <0.30)

## 2013-08-13 MED ORDER — SODIUM CHLORIDE 0.9 % IV SOLN
INTRAVENOUS | Status: DC
  Administered 2013-08-13: 06:00:00 via INTRAVENOUS

## 2013-08-13 MED ORDER — METOPROLOL TARTRATE 1 MG/ML IV SOLN
5.0000 mg | Freq: Four times a day (QID) | INTRAVENOUS | Status: DC
  Administered 2013-08-13 – 2013-08-15 (×9): 5 mg via INTRAVENOUS
  Filled 2013-08-13 (×16): qty 5

## 2013-08-13 MED ORDER — PIPERACILLIN-TAZOBACTAM 3.375 G IVPB 30 MIN
3.3750 g | Freq: Once | INTRAVENOUS | Status: AC
  Administered 2013-08-13: 3.375 g via INTRAVENOUS
  Filled 2013-08-13: qty 50

## 2013-08-13 MED ORDER — DEXTROSE-NACL 5-0.9 % IV SOLN
INTRAVENOUS | Status: DC
  Administered 2013-08-13: 11:00:00 via INTRAVENOUS

## 2013-08-13 MED ORDER — PIPERACILLIN-TAZOBACTAM 3.375 G IVPB
3.3750 g | Freq: Three times a day (TID) | INTRAVENOUS | Status: DC
  Administered 2013-08-13 – 2013-08-16 (×9): 3.375 g via INTRAVENOUS
  Filled 2013-08-13 (×11): qty 50

## 2013-08-13 MED ORDER — ACETAMINOPHEN 650 MG RE SUPP: 650.0000 mg | Freq: Four times a day (QID) | RECTAL | Status: DC | PRN

## 2013-08-13 MED ORDER — SODIUM CHLORIDE 0.9 % IJ SOLN
3.0000 mL | Freq: Two times a day (BID) | INTRAMUSCULAR | Status: DC
  Administered 2013-08-13 – 2013-08-17 (×8): 3 mL via INTRAVENOUS

## 2013-08-13 MED ORDER — ONDANSETRON HCL 4 MG PO TABS: 4.0000 mg | ORAL_TABLET | Freq: Four times a day (QID) | ORAL | Status: DC | PRN

## 2013-08-13 MED ORDER — METRONIDAZOLE IN NACL 5-0.79 MG/ML-% IV SOLN
500.0000 mg | Freq: Once | INTRAVENOUS | Status: DC
  Filled 2013-08-13: qty 100

## 2013-08-13 MED ORDER — MORPHINE SULFATE 2 MG/ML IJ SOLN
1.0000 mg | INTRAMUSCULAR | Status: DC | PRN
  Administered 2013-08-13 – 2013-08-16 (×5): 1 mg via INTRAVENOUS
  Filled 2013-08-13 (×5): qty 1

## 2013-08-13 MED ORDER — ONDANSETRON HCL 4 MG/2ML IJ SOLN
4.0000 mg | Freq: Four times a day (QID) | INTRAMUSCULAR | Status: DC | PRN
  Administered 2013-08-13 – 2013-08-16 (×7): 4 mg via INTRAVENOUS
  Filled 2013-08-13 (×7): qty 2

## 2013-08-13 MED ORDER — LEVOTHYROXINE SODIUM 100 MCG IV SOLR
12.5000 ug | Freq: Every day | INTRAVENOUS | Status: DC
  Administered 2013-08-13 – 2013-08-14 (×2): 12.5 ug via INTRAVENOUS
  Filled 2013-08-13 (×3): qty 5

## 2013-08-13 MED ORDER — ACETAMINOPHEN 325 MG PO TABS: 650.0000 mg | ORAL_TABLET | Freq: Four times a day (QID) | ORAL | Status: DC | PRN

## 2013-08-13 MED ORDER — HYDRALAZINE HCL 20 MG/ML IJ SOLN: 10.0000 mg | INTRAMUSCULAR | Status: DC | PRN

## 2013-08-13 MED ORDER — ENOXAPARIN SODIUM 150 MG/ML ~~LOC~~ SOLN
130.0000 mg | Freq: Two times a day (BID) | SUBCUTANEOUS | Status: DC
  Administered 2013-08-13 – 2013-08-17 (×9): 130 mg via SUBCUTANEOUS
  Filled 2013-08-13 (×10): qty 1

## 2013-08-13 NOTE — ED Notes (Signed)
Xray at [facility], no changes, family at [facility], pt alert, NAD, calm, interactive, abx infusing.

## 2013-08-13 NOTE — Progress Notes (Signed)
Patient admitted early this AM by Dr. Toniann Fail.  Please see H&P.  Continue abx and NPO status- appreciate surgery following  Marlin Canary DO

## 2013-08-13 NOTE — ED Provider Notes (Signed)
CSN: [id number]     Arrival date & time 08/12/13  2344 History   First MD Initiated Contact with Patient 08/13/13 0035     Chief Complaint  Patient presents with  . Abdominal Pain   (Consider location/radiation/quality/duration/timing/severity/associated sxs/prior Treatment) HPI Patient presents with several days of lower bowel pain. Chest CT done as an outpatient should she had left-sided sigmoid diverticulitis with microperforation and possible early abscess. Patient denies fevers or chills. She's had no diarrhea or constipation. She does note small amount of blood in her stool. She is on warfarin for atrial fibrillation. Past Medical History  Diagnosis Date  . Hyperlipidemia   . Hypertension   . Hypothyroidism     borderline  . Osteoarthritis of knee     s/p replacement  . Breast mass     left  . Benign head tremor   . Atrial fibrillation 2010    s/p DC cardioversion. on coumadin  . Insomnia    Past Surgical History  Procedure Laterality Date  . Replacement total knee  2005    bilateral  . Vaginal hysterectomy  1971    partial?  . Appendectomy  1947  . Dilation and curettage of uterus    . Cholecystectomy  1980  . Cardiac catheterization      [facility];Dr. Juliann Pares  . Dexa  10/2012    WNL   Family History  Problem Relation Age of Onset  . CAD Mother     angina  . Cancer Paternal Grandfather     ?  Marland Kitchen Alzheimer's disease Father     died from PNA  . Diabetes Mother   . Stroke Mother 71   History  Substance Use Topics  . Smoking status: Never Smoker   . Smokeless tobacco: Never Used  . Alcohol Use: No   OB History   Grav Para Term Preterm Abortions TAB SAB Ect Mult Living                 Review of Systems  Constitutional: Negative for fever and chills.  Respiratory: Negative for shortness of breath.   Cardiovascular: Negative for chest pain.  Gastrointestinal: Positive for abdominal pain and blood in stool. Negative for nausea, vomiting, diarrhea and  constipation.  Genitourinary: Negative for dysuria, frequency and flank pain.  Musculoskeletal: Negative for back pain, neck pain and neck stiffness.  Skin: Negative for rash and wound.  Neurological: Negative for dizziness, weakness, light-headedness, numbness and headaches.  All other systems reviewed and are negative.    Allergies  Clindamycin/lincomycin; Codeine; Doxycycline; Paxil; Promethazine; Sulfa antibiotics; and Valium  Home Medications   Current Outpatient Rx  Name  Route  Sig  Dispense  Refill  . amLODipine-benazepril (LOTREL) 10-40 MG per capsule   Oral   Take 1 capsule by mouth daily.   90 capsule   0   . clonazePAM (KLONOPIN) 0.5 MG tablet      TAKE 1 TABLET BY MOUTH EVERY NIGHT AT BEDTIME   30 tablet   3   . flecainide (TAMBOCOR) 100 MG tablet   Oral   Take 1 tablet (100 mg total) by mouth 2 (two) times daily.   180 tablet   0   . furosemide (LASIX) 40 MG tablet   Oral   Take 1 tablet (40 mg total) by mouth daily. 2nd dose prn   90 tablet   1   . lansoprazole (PREVACID) 15 MG capsule   Oral   Take 1 capsule (15 mg total) by  mouth daily.   90 capsule   0   . levothyroxine (SYNTHROID, LEVOTHROID) 25 MCG tablet   Oral   Take 1 tablet (25 mcg total) by mouth daily.   90 tablet   1   . metoprolol (LOPRESSOR) 50 MG tablet   Oral   Take 1 tablet (50 mg total) by mouth 2 (two) times daily.   180 tablet   0   . simvastatin (ZOCOR) 20 MG tablet   Oral   Take 0.5 tablets (10 mg total) by mouth every evening.   45 tablet   0   . warfarin (COUMADIN) 5 MG tablet      Take as directed by [facility].   90 tablet   0    BP 151/65  Pulse 74  Temp(Src) 99.1 F (37.3 C) (Oral)  Resp 23  SpO2 96% Physical Exam  Nursing note and vitals reviewed. Constitutional: She is oriented to person, place, and time. She appears well-developed and well-nourished. No distress.  HENT:  Head: Normocephalic and atraumatic.  Mouth/Throat: Oropharynx is  clear and moist.  Eyes: EOM are normal. Pupils are equal, round, and reactive to light.  Neck: Normal range of motion. Neck supple.  Cardiovascular: Normal rate and regular rhythm.   Pulmonary/Chest: Effort normal and breath sounds normal. No respiratory distress. She has no wheezes. She has no rales.  Abdominal: Soft. Bowel sounds are normal. She exhibits no distension and no mass. There is tenderness (left lower quadrant tenderness palpation.). There is guarding. There is no rebound.  Musculoskeletal: Normal range of motion. She exhibits edema. She exhibits no tenderness.  Diffuse bilateral lower extremity edema  Neurological: She is alert and oriented to person, place, and time.  Skin: Skin is warm and dry. No rash noted. No erythema.  Psychiatric: She has a normal mood and affect. Her behavior is normal.    ED Course  Procedures (including critical care time) Labs Review Labs Reviewed  CBC WITH DIFFERENTIAL - Abnormal; Notable for the following:    WBC 13.2 (*)    Neutro Abs 9.4 (*)    Monocytes Absolute 1.1 (*)    All other components within normal limits  COMPREHENSIVE METABOLIC PANEL  TROPONIN I  PROTIME-INR  APTT   Imaging Review Ct Abdomen Pelvis W Contrast  08/12/2013   CLINICAL DATA:  Lower abdominal pain. Previous hysterectomy, appendectomy, cholecystectomy.  EXAM: CT ABDOMEN AND PELVIS WITH CONTRAST  TECHNIQUE: Multidetector CT imaging of the abdomen and pelvis was performed using the standard protocol following bolus administration of intravenous contrast.  CONTRAST:  OMNIPAQUE IOHEXOL 300 MG/ML  SOLN  COMPARISON:  CT of the abdomen and pelvis on 06/24/2009  FINDINGS: Lung bases are negative. There is mild intra and extrahepatic biliary ductal dilatation following cholecystectomy, a normal variant. No focal lesions identified within the liver, spleen, pancreas, adrenal glands, or kidneys.  Note is made of a hiatal hernia. Otherwise, the stomach has a normal  appearance. Small bowel loops are normal in caliber. The appendix is surgically absent. The sigmoid colon is notable for significant mural thickening and inflammatory change. Numerous colonic diverticula are identified within this segment. Findings are consistent with acute diverticulitis. Just superior to the sigmoid segment, there is an irregular collection of fluid and air, measuring 2.1 x 2.0 cm, consistent with early abscess. There are locules of gas adjacent to the sigmoid colon, consistent with micro perforation. There is significant mesenteric fluid. Fluid abuts the left ovary which contains a coarse calcification, unchanged  in appearance and measuring 1.9 cm. This could represent a small ovarian dermoid. The right ovary has a normal appearance. The uterus is surgically absent.  There are significant degenerative changes throughout the lower thoracic and lumbar spine. No suspicious lytic or blastic lesions are identified.  IMPRESSION: 1. Significant changes of sigmoid diverticulitis. 2. Small area of micro perforation and possible early abscess just superior to the sigmoid segment, measuring 2.1 cm in diameter. 3. 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 4. Status post cholecystectomy, hysterectomy. 5. The findings were discussed with Ernest Haber, M.D. on 08/12/2013 at 11:14 PM. 6. I discussed the findings with the patient and her daughter. She will be evaluated in the emergency department.   Electronically Signed   By: Rosalie Gums M.D.   On: 08/12/2013 23:37    EKG Interpretation   None      Date: 08/13/2013  Rate: 72  Rhythm: normal sinus rhythm  QRS Axis: normal  Intervals: QRS mildly prolonged  ST/T Wave abnormalities: normal  Conduction Disutrbances:none  Narrative Interpretation:   Old EKG Reviewed: unchanged    MDM  Discussed with Dr. Janee Morn. Will see in emergency department. Asked to have the hospitalist admit. Discussed with [facility]. Will admit to  telemetry bed. Patient remained stable in the emergency department.  Loren Racer, MD 08/13/13 0200

## 2013-08-13 NOTE — Telephone Encounter (Signed)
Confidential Office Message [address] [place], [place] [address] p. [phone] f. [phone] To: [facility] (After Hours Triage) Fax: [phone] From: [facility] Date/ Time: 08/12/2013 11:19 PM Taken By: Rockwell Germany, CSR Caller: Beth Facility: not collected Patient: Mckenzie Frank, Mckenzie Frank DOB: 1940/12/04 Phone: [phone] Reason for Call: Mckenzie Frank is calling with results for CT ordered by Eustaquio Boyden [facility]). The results are abnormal and were read by [facility]. Regarding Appointment: Appt Date: Appt Time: Unknown Provider: Eustaquio Boyden [facility]) Reason: Details: Confidential Outcome:

## 2013-08-13 NOTE — Progress Notes (Signed)
ANTICOAGULATION CONSULT NOTE - Initial Consult  Pharmacy Consult for Lovenox Indication: atrial fibrillation  Allergies  Allergen Reactions  . Clindamycin/Lincomycin   . Codeine   . Doxycycline   . Paxil [Paroxetine Hcl]   . Promethazine   . Sulfa Antibiotics   . Valium     Patient Measurements: Height: 5\' 7"  (170.2 cm) Weight: 288 lb 14.4 oz (131.044 kg) (B Scale) IBW/kg (Calculated) : 61.6 Heparin Dosing Weight:   Vital Signs: Temp: 98 F (36.7 C) (12/12 0335) Temp src: Oral (12/12 0335) BP: 152/52 mmHg (12/12 0631) Pulse Rate: 69 (12/12 0631)  Labs:  Recent Labs  08/12/13 1853 08/13/13 0038 08/13/13 0648  HGB 13.5 13.8 13.0  HCT 39.2 40.7 38.8  PLT 287 263 219  APTT  --  50*  --   LABPROT  --  19.7* 19.5*  INR  --  1.72* 1.70*  CREATININE 1.14* 1.01 0.94  TROPONINI  --  <0.30  --     Estimated Creatinine Clearance: 74.1 ml/min (by C-G formula based on Cr of 0.94).   Medical History: Past Medical History  Diagnosis Date  . Hyperlipidemia   . Hypertension   . Hypothyroidism     borderline  . Osteoarthritis of knee     s/p replacement  . Breast mass     left  . Benign head tremor   . Atrial fibrillation 2010    s/p DC cardioversion. on coumadin  . Insomnia     Medications:  Scheduled:  . levothyroxine  12.5 mcg Intravenous Daily  . metoprolol  5 mg Intravenous Q6H  . piperacillin-tazobactam (ZOSYN)  IV  3.375 g Intravenous Q8H  . sodium chloride  3 mL Intravenous Q12H    Assessment: 72yo female admitted with abdominal pain and CT(+) sigmoid diverticulitis, possible abscess and microperforation.  Coumadin is currently on hold for the possibility of surgery, and INR 1.7.  To bridge with Lovenox.  Hg 13 and pltc 219, Cr < 1.  Goal of Therapy:  Anti-Xa level 0.6-1 units/ml 4hrs after LMWH dose given Monitor platelets by anticoagulation protocol: Yes   Plan:  1.  Lovenox 130mg  SQ q12 2.  CBC q72 while on Lovenox  Marisue Humble,  PharmD Clinical Pharmacist [facility]

## 2013-08-13 NOTE — Telephone Encounter (Signed)
Noted  

## 2013-08-13 NOTE — ED Notes (Addendum)
Pt went to PCP today for abd pain, PCP sent pt for CT scan, pt to ED from CT d/t "severe diverticulitis", pt on coumadin for afib, (denies: nvd, fever, bleeding, sob, CP, weakness, dizziness), rates abd pain 1/10 at this time, has had pain all the way across abd "R>L". Mentioned one episode of noticing blood when wiping after BM, last BM this morning (normal), last ate this am (2 bites). Blood work done today in PCP office, see results in EPIC.

## 2013-08-13 NOTE — ED Notes (Signed)
Back from b/r via w/c (void only), no changes, denies sx at this time, abx infusing, pending arrival of consulting and admitting MD.  Family at [facility]. Alert, NAD, calm.

## 2013-08-13 NOTE — Consult Note (Signed)
Reason for Consult:Diverticulitis Referring Physician: Lakeeta Dobosz is an 72 y.o. female.  HPI: Patient initially developed lower abdominal pain one month ago. Seemed to resolve spontaneously. She thought it was a virus or something similar. The pain returned, however, 4 days ago. It was more severe. It was localized in her lower abdomen. She has not had changes in bowel movements. She was evaluated by her primary care physician who sent her for CT scan. This revealed sigmoid diverticulitis with microperforation and possible early abscess formation. I was asked to see her in consultation by Dr. Ranae Palms in the emergency department. She is being admitted by the hospitalist service in light of her medical problems.  Past Medical History  Diagnosis Date  . Hyperlipidemia   . Hypertension   . Hypothyroidism     borderline  . Osteoarthritis of knee     s/p replacement  . Breast mass     left  . Benign head tremor   . Atrial fibrillation 2010    s/p DC cardioversion. on coumadin  . Insomnia     Past Surgical History  Procedure Laterality Date  . Replacement total knee  2005    bilateral  . Vaginal hysterectomy  1971    partial?  . Appendectomy  1947  . Dilation and curettage of uterus    . Cholecystectomy  1980  . Cardiac catheterization      [facility];Dr. Juliann Pares  . Dexa  10/2012    WNL    Family History  Problem Relation Age of Onset  . CAD Mother     angina  . Cancer Paternal Grandfather     ?  Marland Kitchen Alzheimer's disease Father     died from PNA  . Diabetes Mother   . Stroke Mother 57    Social History:  reports that she has never smoked. She has never used smokeless tobacco. She reports that she does not drink alcohol or use illicit drugs.  Allergies:  Allergies  Allergen Reactions  . Clindamycin/Lincomycin   . Codeine   . Doxycycline   . Paxil [Paroxetine Hcl]   . Promethazine   . Sulfa Antibiotics   . Valium     Medications:  Prior to  Admission:  Prescriptions prior to admission  Medication Sig Dispense Refill  . amLODipine-benazepril (LOTREL) 10-40 MG per capsule Take 1 capsule by mouth daily.  90 capsule  0  . clonazePAM (KLONOPIN) 0.5 MG tablet TAKE 1 TABLET BY MOUTH EVERY NIGHT AT BEDTIME  30 tablet  3  . flecainide (TAMBOCOR) 100 MG tablet Take 1 tablet (100 mg total) by mouth 2 (two) times daily.  180 tablet  0  . furosemide (LASIX) 40 MG tablet Take 1 tablet (40 mg total) by mouth daily. 2nd dose prn  90 tablet  1  . lansoprazole (PREVACID) 15 MG capsule Take 1 capsule (15 mg total) by mouth daily.  90 capsule  0  . levothyroxine (SYNTHROID, LEVOTHROID) 25 MCG tablet Take 1 tablet (25 mcg total) by mouth daily.  90 tablet  1  . metoprolol (LOPRESSOR) 50 MG tablet Take 1 tablet (50 mg total) by mouth 2 (two) times daily.  180 tablet  0  . simvastatin (ZOCOR) 20 MG tablet Take 0.5 tablets (10 mg total) by mouth every evening.  45 tablet  0  . warfarin (COUMADIN) 5 MG tablet Take as directed by [facility].  90 tablet  0    Results for orders placed during the hospital  encounter of 08/12/13 (from the past 48 hour(s))  CBC WITH DIFFERENTIAL     Status: Abnormal   Collection Time    08/13/13 12:38 AM      Result Value Range   WBC 13.2 (*) 4.0 - 10.5 K/uL   RBC 4.75  3.87 - 5.11 MIL/uL   Hemoglobin 13.8  12.0 - 15.0 g/dL   HCT 13.2  44.0 - 10.2 %   MCV 85.7  78.0 - 100.0 fL   MCH 29.1  26.0 - 34.0 pg   MCHC 33.9  30.0 - 36.0 g/dL   RDW 72.5  36.6 - 44.0 %   Platelets 263  150 - 400 K/uL   Neutrophils Relative % 71  43 - 77 %   Neutro Abs 9.4 (*) 1.7 - 7.7 K/uL   Lymphocytes Relative 19  12 - 46 %   Lymphs Abs 2.5  0.7 - 4.0 K/uL   Monocytes Relative 9  3 - 12 %   Monocytes Absolute 1.1 (*) 0.1 - 1.0 K/uL   Eosinophils Relative 1  0 - 5 %   Eosinophils Absolute 0.1  0.0 - 0.7 K/uL   Basophils Relative 1  0 - 1 %   Basophils Absolute 0.1  0.0 - 0.1 K/uL  COMPREHENSIVE METABOLIC PANEL     Status: Abnormal    Collection Time    08/13/13 12:38 AM      Result Value Range   Sodium 136  135 - 145 mEq/L   Potassium 3.5  3.5 - 5.1 mEq/L   Chloride 98  96 - 112 mEq/L   CO2 27  19 - 32 mEq/L   Glucose, Bld 93  70 - 99 mg/dL   BUN 25 (*) 6 - 23 mg/dL   Creatinine, Ser 3.47  0.50 - 1.10 mg/dL   Calcium 8.9  8.4 - 42.5 mg/dL   Total Protein 7.8  6.0 - 8.3 g/dL   Albumin 3.9  3.5 - 5.2 g/dL   AST 25  0 - 37 U/L   ALT 26  0 - 35 U/L   Alkaline Phosphatase 141 (*) 39 - 117 U/L   Total Bilirubin 1.3 (*) 0.3 - 1.2 mg/dL   GFR calc non Af Amer 53 (*) >90 mL/min   GFR calc Af Amer 62 (*) >90 mL/min   Comment: (NOTE)     The eGFR has been calculated using the CKD EPI equation.     This calculation has not been validated in all clinical situations.     eGFR's persistently <90 mL/min signify possible Chronic Kidney     Disease.  TROPONIN I     Status: None   Collection Time    08/13/13 12:38 AM      Result Value Range   Troponin I <0.30  <0.30 ng/mL   Comment:            Due to the release kinetics of cTnI,     a negative result within the first hours     of the onset of symptoms does not rule out     myocardial infarction with certainty.     If myocardial infarction is still suspected,     repeat the test at appropriate intervals.  PROTIME-INR     Status: Abnormal   Collection Time    08/13/13 12:38 AM      Result Value Range   Prothrombin Time 19.7 (*) 11.6 - 15.2 seconds   INR 1.72 (*) 0.00 - 1.49  APTT     Status: Abnormal   Collection Time    08/13/13 12:38 AM      Result Value Range   aPTT 50 (*) 24 - 37 seconds   Comment:            IF BASELINE aPTT IS ELEVATED,     SUGGEST PATIENT RISK ASSESSMENT     BE USED TO DETERMINE APPROPRIATE     ANTICOAGULANT THERAPY.  GLUCOSE, CAPILLARY     Status: Abnormal   Collection Time    08/13/13  6:07 AM      Result Value Range   Glucose-Capillary 101 (*) 70 - 99 mg/dL    Ct Abdomen Pelvis W Contrast  08/12/2013   CLINICAL DATA:  Lower  abdominal pain. Previous hysterectomy, appendectomy, cholecystectomy.  EXAM: CT ABDOMEN AND PELVIS WITH CONTRAST  TECHNIQUE: Multidetector CT imaging of the abdomen and pelvis was performed using the standard protocol following bolus administration of intravenous contrast.  CONTRAST:  OMNIPAQUE IOHEXOL 300 MG/ML  SOLN  COMPARISON:  CT of the abdomen and pelvis on 06/24/2009  FINDINGS: Lung bases are negative. There is mild intra and extrahepatic biliary ductal dilatation following cholecystectomy, a normal variant. No focal lesions identified within the liver, spleen, pancreas, adrenal glands, or kidneys.  Note is made of a hiatal hernia. Otherwise, the stomach has a normal appearance. Small bowel loops are normal in caliber. The appendix is surgically absent. The sigmoid colon is notable for significant mural thickening and inflammatory change. Numerous colonic diverticula are identified within this segment. Findings are consistent with acute diverticulitis. Just superior to the sigmoid segment, there is an irregular collection of fluid and air, measuring 2.1 x 2.0 cm, consistent with early abscess. There are locules of gas adjacent to the sigmoid colon, consistent with micro perforation. There is significant mesenteric fluid. Fluid abuts the left ovary which contains a coarse calcification, unchanged in appearance and measuring 1.9 cm. This could represent a small ovarian dermoid. The right ovary has a normal appearance. The uterus is surgically absent.  There are significant degenerative changes throughout the lower thoracic and lumbar spine. No suspicious lytic or blastic lesions are identified.  IMPRESSION: 1. Significant changes of sigmoid diverticulitis. 2. Small area of micro perforation and possible early abscess just superior to the sigmoid segment, measuring 2.1 cm in diameter. 3. 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 4. Status post cholecystectomy, hysterectomy. 5. The  findings were discussed with Ernest Haber, M.D. on 08/12/2013 at 11:14 PM. 6. I discussed the findings with the patient and her daughter. She will be evaluated in the emergency department.   Electronically Signed   By: Rosalie Gums M.D.   On: 08/12/2013 23:37   Dg Chest Port 1 View  08/13/2013   CLINICAL DATA:  Preop.  History of hypertension.  EXAM: PORTABLE CHEST - 1 VIEW  COMPARISON:  06/22/2010  FINDINGS: The heart is enlarged. The lungs are free of focal consolidations and pleural effusions. No pulmonary edema.  IMPRESSION: Cardiomegaly.   Electronically Signed   By: Rosalie Gums M.D.   On: 08/13/2013 01:17    Review of Systems  Constitutional: Negative for fever and chills.  HENT: Negative.   Eyes: Negative.   Respiratory: Negative.   Cardiovascular: Negative.   Gastrointestinal: Positive for abdominal pain. Negative for nausea, vomiting and blood in stool.  Genitourinary: Negative.   Musculoskeletal: Negative.   Skin: Negative.   Neurological: Negative.   Endo/Heme/Allergies: Negative.   Psychiatric/Behavioral: Negative.  Blood pressure 150/51, pulse 66, temperature 98 F (36.7 C), temperature source Oral, resp. rate 22, height 5\' 7"  (1.702 m), weight 288 lb 14.4 oz (131.044 kg), SpO2 98.00%. Physical Exam  Constitutional: She is oriented to person, place, and time. She appears well-developed and well-nourished. No distress.  HENT:  Head: Normocephalic and atraumatic.  Nose: Nose normal.  Mouth/Throat: Oropharynx is clear and moist.  Eyes: Conjunctivae and EOM are normal. Pupils are equal, round, and reactive to light. No scleral icterus.  Neck: Normal range of motion. Neck supple. No tracheal deviation present.  Cardiovascular:  Irregularly irregular, bilateral lower extremity edema  Respiratory: Effort normal and breath sounds normal. No stridor. No respiratory distress. She has no wheezes. She has no rales.  GI: Soft. She exhibits no distension and no mass. There is  tenderness. There is no rebound and no guarding.  Lower abdominal and suprapubic tenderness to deep palpation, no guarding, no generalized tenderness, no peritonitis  Musculoskeletal: Normal range of motion. She exhibits no tenderness.  Neurological: She is alert and oriented to person, place, and time. She exhibits normal muscle tone.  Skin: Skin is warm.  Psychiatric: She has a normal mood and affect.    Assessment/Plan: Sigmoid diverticulitis with microperforation and possible early abscess formation. Recommend IV antibiotics and bowel rest. If she does not improve, will consider repeat CT scan. If she worsens significantly she may need surgery on this admission which may include laparoscopic drainage versus colectomy and colostomy. Plan was discussed in detail the patient and her daughter. We will follow with you.  Vonya Ohalloran E 08/13/2013, 6:25 AM

## 2013-08-13 NOTE — H&P (Signed)
[facility] History and Physical  BRITTANY OSIER WUJ:[id number] DOB: 05-07-1941 DOA: 08/12/2013  Referring physician: ER physician. PCP: Eustaquio Boyden, MD   Chief Complaint: Abnormal CAT scan.  HPI: Mckenzie Frank is a 72 y.o. female with history of atrial fibrillation, hypertension, hypothyroidism has been experiencing abdominal pain over the last one month. Over the last 3 days it acutely worsened. Patient did not have any nausea vomiting diarrhea. Has not been on any antibiotics recently. The abdominal pain was mostly in the left lower quadrant. The pain worsened so much that patient was not able to ambulate. Patient went to her PCP yesterday and had a CAT scan which showed sigmoid diverticulitis with possible abscess and microperforation. Patient was referred to the ER. On-call surgeon Dr. Janee Morn was consulted by the ER physician and patient has been admitted for further workup. Patient otherwise denies any fever chills chest pain shortness of breath.   Review of Systems: As presented in the history of presenting illness, rest negative.  Past Medical History  Diagnosis Date  . Hyperlipidemia   . Hypertension   . Hypothyroidism     borderline  . Osteoarthritis of knee     s/p replacement  . Breast mass     left  . Benign head tremor   . Atrial fibrillation 2010    s/p DC cardioversion. on coumadin  . Insomnia    Past Surgical History  Procedure Laterality Date  . Replacement total knee  2005    bilateral  . Vaginal hysterectomy  1971    partial?  . Appendectomy  1947  . Dilation and curettage of uterus    . Cholecystectomy  1980  . Cardiac catheterization      [facility];Dr. Juliann Pares  . Dexa  10/2012    WNL   Social History:  reports that she has never smoked. She has never used smokeless tobacco. She reports that she does not drink alcohol or use illicit drugs. Where does patient live home. Can patient participate in ADLs? Yes.  Allergies  Allergen  Reactions  . Clindamycin/Lincomycin   . Codeine   . Doxycycline   . Paxil [Paroxetine Hcl]   . Promethazine   . Sulfa Antibiotics   . Valium     Family History:  Family History  Problem Relation Age of Onset  . CAD Mother     angina  . Cancer Paternal Grandfather     ?  Marland Kitchen Alzheimer's disease Father     died from PNA  . Diabetes Mother   . Stroke Mother 31      Prior to Admission medications   Medication Sig Start Date End Date Taking? Authorizing Provider  amLODipine-benazepril (LOTREL) 10-40 MG per capsule Take 1 capsule by mouth daily. 08/06/13  Yes Eustaquio Boyden, MD  clonazePAM (KLONOPIN) 0.5 MG tablet TAKE 1 TABLET BY MOUTH EVERY NIGHT AT BEDTIME 07/23/13  Yes Eustaquio Boyden, MD  flecainide (TAMBOCOR) 100 MG tablet Take 1 tablet (100 mg total) by mouth 2 (two) times daily. 08/06/13  Yes Eustaquio Boyden, MD  furosemide (LASIX) 40 MG tablet Take 1 tablet (40 mg total) by mouth daily. 2nd dose prn 08/06/13  Yes Eustaquio Boyden, MD  lansoprazole (PREVACID) 15 MG capsule Take 1 capsule (15 mg total) by mouth daily. 08/06/13  Yes Eustaquio Boyden, MD  levothyroxine (SYNTHROID, LEVOTHROID) 25 MCG tablet Take 1 tablet (25 mcg total) by mouth daily. 05/27/13  Yes Eustaquio Boyden, MD  metoprolol (LOPRESSOR) 50 MG tablet Take 1 tablet (50  mg total) by mouth 2 (two) times daily. 08/06/13  Yes Eustaquio Boyden, MD  simvastatin (ZOCOR) 20 MG tablet Take 0.5 tablets (10 mg total) by mouth every evening. 08/06/13  Yes Eustaquio Boyden, MD  warfarin (COUMADIN) 5 MG tablet Take as directed by [facility]. 08/06/13  Yes Eustaquio Boyden, MD    Physical Exam: Filed Vitals:   08/13/13 0245 08/13/13 0300 08/13/13 0315 08/13/13 0335  BP: 166/66 169/61 155/62 150/51  Pulse: 62 62 64 66  Temp:    98 F (36.7 C)  TempSrc:    Oral  Resp: 22 14 24 22   Height:    5\' 7"  (1.702 m)  Weight:    131.044 kg (288 lb 14.4 oz)  SpO2: 94% 95% 94% 98%     General:  Well-developed and  nourished.  Eyes: Anicteric no pallor.  ENT: No discharge from the ears eyes nose mouth.  Neck: No mass felt.  Cardiovascular: S1-S2 heard.  Respiratory: No rhonchi or crepitations.  Abdomen: Mild tenderness in the lower quadrant on the left side. No guarding or rigidity.  Skin: No rash.  Musculoskeletal: No edema.  Psychiatric: Appears normal.  Neurologic: Alert awake oriented to time place and person. Moves all extremities.  Labs on Admission:  Basic Metabolic Panel:  Recent Labs Lab 08/12/13 1853 08/13/13 0038  NA 139 136  K 4.2 3.5  CL 100 98  CO2 30 27  GLUCOSE 95 93  BUN 26* 25*  CREATININE 1.14* 1.01  CALCIUM 9.1 8.9   Liver Function Tests:  Recent Labs Lab 08/13/13 0038  AST 25  ALT 26  ALKPHOS 141*  BILITOT 1.3*  PROT 7.8  ALBUMIN 3.9   No results found for this basename: LIPASE, AMYLASE,  in the last 168 hours No results found for this basename: AMMONIA,  in the last 168 hours CBC:  Recent Labs Lab 08/12/13 1853 08/13/13 0038  WBC 13.1* 13.2*  NEUTROABS 9.9* 9.4*  HGB 13.5 13.8  HCT 39.2 40.7  MCV 80.7 85.7  PLT 287 263   Cardiac Enzymes:  Recent Labs Lab 08/13/13 0038  TROPONINI <0.30    BNP (last 3 results) No results found for this basename: PROBNP,  in the last 8760 hours CBG: No results found for this basename: GLUCAP,  in the last 168 hours  Radiological Exams on Admission: Ct Abdomen Pelvis W Contrast  08/12/2013   CLINICAL DATA:  Lower abdominal pain. Previous hysterectomy, appendectomy, cholecystectomy.  EXAM: CT ABDOMEN AND PELVIS WITH CONTRAST  TECHNIQUE: Multidetector CT imaging of the abdomen and pelvis was performed using the standard protocol following bolus administration of intravenous contrast.  CONTRAST:  OMNIPAQUE IOHEXOL 300 MG/ML  SOLN  COMPARISON:  CT of the abdomen and pelvis on 06/24/2009  FINDINGS: Lung bases are negative. There is mild intra and extrahepatic biliary ductal dilatation following  cholecystectomy, a normal variant. No focal lesions identified within the liver, spleen, pancreas, adrenal glands, or kidneys.  Note is made of a hiatal hernia. Otherwise, the stomach has a normal appearance. Small bowel loops are normal in caliber. The appendix is surgically absent. The sigmoid colon is notable for significant mural thickening and inflammatory change. Numerous colonic diverticula are identified within this segment. Findings are consistent with acute diverticulitis. Just superior to the sigmoid segment, there is an irregular collection of fluid and air, measuring 2.1 x 2.0 cm, consistent with early abscess. There are locules of gas adjacent to the sigmoid colon, consistent with micro perforation. There is significant  mesenteric fluid. Fluid abuts the left ovary which contains a coarse calcification, unchanged in appearance and measuring 1.9 cm. This could represent a small ovarian dermoid. The right ovary has a normal appearance. The uterus is surgically absent.  There are significant degenerative changes throughout the lower thoracic and lumbar spine. No suspicious lytic or blastic lesions are identified.  IMPRESSION: 1. Significant changes of sigmoid diverticulitis. 2. Small area of micro perforation and possible early abscess just superior to the sigmoid segment, measuring 2.1 cm in diameter. 3. 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 4. Status post cholecystectomy, hysterectomy. 5. The findings were discussed with Ernest Haber, M.D. on 08/12/2013 at 11:14 PM. 6. I discussed the findings with the patient and her daughter. She will be evaluated in the emergency department.   Electronically Signed   By: Rosalie Gums M.D.   On: 08/12/2013 23:37   Dg Chest Port 1 View  08/13/2013   CLINICAL DATA:  Preop.  History of hypertension.  EXAM: PORTABLE CHEST - 1 VIEW  COMPARISON:  06/22/2010  FINDINGS: The heart is enlarged. The lungs are free of focal consolidations and  pleural effusions. No pulmonary edema.  IMPRESSION: Cardiomegaly.   Electronically Signed   By: Rosalie Gums M.D.   On: 08/13/2013 01:17     Assessment/Plan Principal Problem:   Diverticulitis Active Problems:   Atrial fibrillation   Hypertension   Hypothyroidism   1. Sigmoid diverticulitis with possible microperforation and abscess formation - patient has been placed on Zosyn. N.p.o. Pain relief medications and IV gentle hydration. Further recommendations per surgery. 2. History of atrial fibrillation presently rate controlled - hold Coumadin in anticipation of possible surgery. Recheck INR. Type and screen if in case FFP is needed. For now I have placed patient on scheduled dose of IV metoprolol until patient can take oral medications. Closely monitor in telemetry. 3. Hypothyroidism - I have placed patient on IV Synthroid. 4. Hypertension - since patient is n.p.o. I have placed patient on when necessary IV hydralazine for systolic blood pressure more than 180.    Code Status: Full code.  Family Communication: Family at the bedside.  Disposition Plan: Admit to inpatient.    Kayvion Arneson N. [facility] Pager [phone].  If 7PM-7AM, please contact night-coverage [email or web address] Password [email or web address] 08/13/2013, 5:03 AM

## 2013-08-13 NOTE — ED Notes (Signed)
Dr. Yelverton at BS 

## 2013-08-13 NOTE — Telephone Encounter (Signed)
[facility] Triage Call Report Triage Record Num: [id number] Operator: Tana Felts Patient Name: [facility] Call Date & Time: 08/12/2013 10:17:03PM Patient Phone: [phone] PCP: Eustaquio Boyden Patient Gender: Female PCP Fax : [phone] Patient DOB: [birth year] Practice Name: [facility] Reason for Call: Caller: Shanda Bumps calling from [place] lab PCP: Eustaquio Boyden [facility]); CB#: [phone]; Onset: 08/12/13 Call regarding Shanda Bumps is calling from [facility] regarding abnormal results from CBC with diff - WBC (H) 13.1, Absolute grand (H) 9.9, Absolute mono (H) 1.4 and abnormal results from BMP - BUN (H) 26, Cr (H) 1.14 ordered on [place], [place] by Eustaquio Boyden [facility]). Ordered stat at 18:53, results will also be sent to office. Protocol(s) Used: PCP Calls, No Triage (Adult) Recommended Outcome per Protocol: Call Provider within 24 Hours Reason for Outcome: Lab calling with test results Care Advice: ~ 08/12/2013 10:24:41PM Page 1 of 1 [id number]

## 2013-08-13 NOTE — Telephone Encounter (Signed)
Confidential Office Message [address] [place], [place] [address] p. [phone] f. [phone] To: [facility] (After Hours Triage) Fax: [phone] From: [facility] Date/ Time: 08/12/2013 11:25 PM Taken By: Rockwell Germany, CSR Caller: Beth Facility: not collected Patient: Mckenzie Frank, Mckenzie Frank DOB: Jul 06, 1941 Phone: [phone] Reason for Call: Waynetta Sandy is calling with results for CT ordered by Eustaquio Boyden [facility]). The results are abnormal and were read by [facility]. Regarding Appointment: Appt Date: Appt Time: Unknown Provider: Eustaquio Boyden [facility]) Reason: Details: Confidential Outcome:

## 2013-08-13 NOTE — ED Notes (Addendum)
Pt speaking with pharmacy tech and family at [facility], no changes, alert, NAD, calm, denies sx. Given pillow.

## 2013-08-14 DIAGNOSIS — D72829 Elevated white blood cell count, unspecified: Secondary | ICD-10-CM

## 2013-08-14 DIAGNOSIS — N179 Acute kidney failure, unspecified: Secondary | ICD-10-CM

## 2013-08-14 LAB — GLUCOSE, CAPILLARY
Glucose-Capillary: 81 mg/dL (ref 70–99)
Glucose-Capillary: 93 mg/dL (ref 70–99)
Glucose-Capillary: 94 mg/dL (ref 70–99)

## 2013-08-14 LAB — CBC
HCT: 37.4 % (ref 36.0–46.0)
Hemoglobin: 12.4 g/dL (ref 12.0–15.0)
MCH: 28.3 pg (ref 26.0–34.0)
Platelets: 231 10*3/uL (ref 150–400)
RBC: 4.38 MIL/uL (ref 3.87–5.11)

## 2013-08-14 NOTE — Progress Notes (Signed)
Doing better.  Minimally tender.  Will start on clear liquids.  Marta Lamas. Gae Bon, MD, FACS [phone] [facility]

## 2013-08-14 NOTE — Progress Notes (Signed)
Subjective: Pain improved today, had nausea yesterday, no vomiting.  No diarrhea, BM yesterday.    Objective: Vital signs in last 24 hours: Temp:  [98 F (36.7 C)-98.6 F (37 C)] 98 F (36.7 C) (12/13 0527) Pulse Rate:  [63-67] 67 (12/13 0527) Resp:  [18-20] 20 (12/13 0527) BP: (124-152)/(51-57) 141/55 mmHg (12/13 0527) SpO2:  [92 %-95 %] 93 % (12/13 0527) Weight:  [285 lb 9.6 oz (129.547 kg)] 285 lb 9.6 oz (129.547 kg) (12/13 0527) Last BM Date: 08/13/13  Intake/Output from previous day: 12/12 0701 - 12/13 0700 In: 641.7 [I.V.:541.7; IV Piggyback:100] Out: 579 [Urine:577; Stool:2] Intake/Output this shift:   PE General appearance: alert, cooperative and no distress GI: bowel sounds are present, abodmen is soft, non distended.  She exhibits mild tenderness to LLQ and suprapubic region with deep palpation.  Lab Results:   Recent Labs  08/13/13 0648 08/14/13 0412  WBC 9.3 5.4  HGB 13.0 12.4  HCT 38.8 37.4  PLT 219 231   BMET  Recent Labs  08/13/13 0038 08/13/13 0648  NA 136 135  K 3.5 3.5  CL 98 98  CO2 27 24  GLUCOSE 93 103*  BUN 25* 22  CREATININE 1.01 0.94  CALCIUM 8.9 8.5   PT/INR  Recent Labs  08/13/13 0038 08/13/13 0648  LABPROT 19.7* 19.5*  INR 1.72* 1.70*   ABG No results found for this basename: PHART, PCO2, PO2, HCO3,  in the last 72 hours  Studies/Results: Ct Abdomen Pelvis W Contrast  08/12/2013   CLINICAL DATA:  Lower abdominal pain. Previous hysterectomy, appendectomy, cholecystectomy.  EXAM: CT ABDOMEN AND PELVIS WITH CONTRAST  TECHNIQUE: Multidetector CT imaging of the abdomen and pelvis was performed using the standard protocol following bolus administration of intravenous contrast.  CONTRAST:  OMNIPAQUE IOHEXOL 300 MG/ML  SOLN  COMPARISON:  CT of the abdomen and pelvis on 06/24/2009  FINDINGS: Lung bases are negative. There is mild intra and extrahepatic biliary ductal dilatation following cholecystectomy, a normal variant.  No focal lesions identified within the liver, spleen, pancreas, adrenal glands, or kidneys.  Note is made of a hiatal hernia. Otherwise, the stomach has a normal appearance. Small bowel loops are normal in caliber. The appendix is surgically absent. The sigmoid colon is notable for significant mural thickening and inflammatory change. Numerous colonic diverticula are identified within this segment. Findings are consistent with acute diverticulitis. Just superior to the sigmoid segment, there is an irregular collection of fluid and air, measuring 2.1 x 2.0 cm, consistent with early abscess. There are locules of gas adjacent to the sigmoid colon, consistent with micro perforation. There is significant mesenteric fluid. Fluid abuts the left ovary which contains a coarse calcification, unchanged in appearance and measuring 1.9 cm. This could represent a small ovarian dermoid. The right ovary has a normal appearance. The uterus is surgically absent.  There are significant degenerative changes throughout the lower thoracic and lumbar spine. No suspicious lytic or blastic lesions are identified.  IMPRESSION: 1. Significant changes of sigmoid diverticulitis. 2. Small area of micro perforation and possible early abscess just superior to the sigmoid segment, measuring 2.1 cm in diameter. 3. 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 4. Status post cholecystectomy, hysterectomy. 5. The findings were discussed with Ernest Haber, M.D. on 08/12/2013 at 11:14 PM. 6. I discussed the findings with the patient and her daughter. She will be evaluated in the emergency department.   Electronically Signed   By: Rosalie Gums M.D.   On:  08/12/2013 23:37   Dg Chest Port 1 View  08/13/2013   CLINICAL DATA:  Preop.  History of hypertension.  EXAM: PORTABLE CHEST - 1 VIEW  COMPARISON:  06/22/2010  FINDINGS: The heart is enlarged. The lungs are free of focal consolidations and pleural effusions. No pulmonary edema.   IMPRESSION: Cardiomegaly.   Electronically Signed   By: Rosalie Gums M.D.   On: 08/13/2013 01:17    Anti-infectives: Anti-infectives   Start     Dose/Rate Route Frequency Ordered Stop   08/13/13 0800  piperacillin-tazobactam (ZOSYN) IVPB 3.375 g     3.375 g 12.5 mL/hr over 240 Minutes Intravenous Every 8 hours 08/13/13 0520     08/13/13 0045  piperacillin-tazobactam (ZOSYN) IVPB 3.375 g     3.375 g 100 mL/hr over 30 Minutes Intravenous  Once 08/13/13 0039 08/13/13 0240   08/13/13 0045  metroNIDAZOLE (FLAGYL) IVPB 500 mg  Status:  Discontinued     500 mg 100 mL/hr over 60 Minutes Intravenous  Once 08/13/13 0039 08/13/13 0054      Assessment/Plan: Sigmoid diverticulitis with microperforation  She seems to be improving slowly, white count is normal.  Continue with IV antibiotics.  May give clears today.  Continue with IV hydration.  We will consider a repeat CT scan on Monday.  Encourage mobility   LOS: 2 days      Heberto Sturdevant ANP-BC 08/14/2013 9:01 AM

## 2013-08-14 NOTE — Progress Notes (Addendum)
[facility] PROGRESS NOTE  Mckenzie Frank ZOX:[id number] DOB: 1940/11/07 DOA: 08/12/2013 PCP: Eustaquio Boyden, MD  Assessment/Plan: Sigmoid diverticulitis with possible microperforation and abscess formation  Zosyn.  N.p.o.  Pain medications   IV gentle hydration.  -Further recommendations per surgery.   History of atrial fibrillation presently rate controlled  - hold Coumadin in anticipation of possible surgery. -scheduled dose of IV metoprolol until patient can take oral medications.  -lovenox bridge  Hypothyroidism - - IV Synthroid.   Hypertension -  -IV hydralazine for systolic blood pressure more than 180  Leukocytosis -resolved  AKI -resolved  Code Status: full Family Communication: patient Disposition Plan:    Consultants:  surgery  Procedures:    Antibiotics:  Zosyn 12/12  HPI/Subjective: Patient feeling better today Hungry No fever Occasional nausea  Objective: Filed Vitals:   08/14/13 0527  BP: 141/55  Pulse: 67  Temp: 98 F (36.7 C)  Resp: 20    Intake/Output Summary (Last 24 hours) at 08/14/13 0806 Last data filed at 08/14/13 0236  Gross per 24 hour  Intake 641.67 ml  Output    579 ml  Net  62.67 ml   Filed Weights   08/13/13 0335 08/14/13 0527  Weight: 131.044 kg (288 lb 14.4 oz) 129.547 kg (285 lb 9.6 oz)    Exam:   General:  A+Ox3, NAD  Cardiovascular: rrr  Respiratory: clear  Abdomen: +BS, soft  Musculoskeletal: moves all 4 ext   Data Reviewed: Basic Metabolic Panel:  Recent Labs Lab 08/12/13 1853 08/13/13 0038 08/13/13 0648  NA 139 136 135  K 4.2 3.5 3.5  CL 100 98 98  CO2 30 27 24   GLUCOSE 95 93 103*  BUN 26* 25* 22  CREATININE 1.14* 1.01 0.94  CALCIUM 9.1 8.9 8.5   Liver Function Tests:  Recent Labs Lab 08/13/13 0038 08/13/13 0648  AST 25 22  ALT 26 22  ALKPHOS 141* 135*  BILITOT 1.3* 1.4*  PROT 7.8 6.8  ALBUMIN 3.9 3.3*   No results found for this basename: LIPASE, AMYLASE,   in the last 168 hours No results found for this basename: AMMONIA,  in the last 168 hours CBC:  Recent Labs Lab 08/12/13 1853 08/13/13 0038 08/13/13 0648 08/14/13 0412  WBC 13.1* 13.2* 9.3 5.4  NEUTROABS 9.9* 9.4* 6.7  --   HGB 13.5 13.8 13.0 12.4  HCT 39.2 40.7 38.8 37.4  MCV 80.7 85.7 84.3 85.4  PLT 287 263 219 231   Cardiac Enzymes:  Recent Labs Lab 08/13/13 0038  TROPONINI <0.30   BNP (last 3 results) No results found for this basename: PROBNP,  in the last 8760 hours CBG:  Recent Labs Lab 08/13/13 0607 08/13/13 2357 08/14/13 0619  GLUCAP 101* 93 102*    No results found for this or any previous visit (from the past 240 hour(s)).   Studies: Ct Abdomen Pelvis W Contrast  08/12/2013   CLINICAL DATA:  Lower abdominal pain. Previous hysterectomy, appendectomy, cholecystectomy.  EXAM: CT ABDOMEN AND PELVIS WITH CONTRAST  TECHNIQUE: Multidetector CT imaging of the abdomen and pelvis was performed using the standard protocol following bolus administration of intravenous contrast.  CONTRAST:  OMNIPAQUE IOHEXOL 300 MG/ML  SOLN  COMPARISON:  CT of the abdomen and pelvis on 06/24/2009  FINDINGS: Lung bases are negative. There is mild intra and extrahepatic biliary ductal dilatation following cholecystectomy, a normal variant. No focal lesions identified within the liver, spleen, pancreas, adrenal glands, or kidneys.  Note is made of a  hiatal hernia. Otherwise, the stomach has a normal appearance. Small bowel loops are normal in caliber. The appendix is surgically absent. The sigmoid colon is notable for significant mural thickening and inflammatory change. Numerous colonic diverticula are identified within this segment. Findings are consistent with acute diverticulitis. Just superior to the sigmoid segment, there is an irregular collection of fluid and air, measuring 2.1 x 2.0 cm, consistent with early abscess. There are locules of gas adjacent to the sigmoid colon,  consistent with micro perforation. There is significant mesenteric fluid. Fluid abuts the left ovary which contains a coarse calcification, unchanged in appearance and measuring 1.9 cm. This could represent a small ovarian dermoid. The right ovary has a normal appearance. The uterus is surgically absent.  There are significant degenerative changes throughout the lower thoracic and lumbar spine. No suspicious lytic or blastic lesions are identified.  IMPRESSION: 1. Significant changes of sigmoid diverticulitis. 2. Small area of micro perforation and possible early abscess just superior to the sigmoid segment, measuring 2.1 cm in diameter. 3. 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 4. Status post cholecystectomy, hysterectomy. 5. The findings were discussed with Ernest Haber, M.D. on 08/12/2013 at 11:14 PM. 6. I discussed the findings with the patient and her daughter. She will be evaluated in the emergency department.   Electronically Signed   By: Rosalie Gums M.D.   On: 08/12/2013 23:37   Dg Chest Port 1 View  08/13/2013   CLINICAL DATA:  Preop.  History of hypertension.  EXAM: PORTABLE CHEST - 1 VIEW  COMPARISON:  06/22/2010  FINDINGS: The heart is enlarged. The lungs are free of focal consolidations and pleural effusions. No pulmonary edema.  IMPRESSION: Cardiomegaly.   Electronically Signed   By: Rosalie Gums M.D.   On: 08/13/2013 01:17    Scheduled Meds: . enoxaparin (LOVENOX) injection  130 mg Subcutaneous Q12H  . levothyroxine  12.5 mcg Intravenous Daily  . metoprolol  5 mg Intravenous Q6H  . piperacillin-tazobactam (ZOSYN)  IV  3.375 g Intravenous Q8H  . sodium chloride  3 mL Intravenous Q12H  - Continuous Infusions: . sodium chloride 10 mL/hr at 08/13/13 0542  . dextrose 5 % and 0.9% NaCl 50 mL/hr at 08/13/13 1128    Principal Problem:   Diverticulitis Active Problems:   Atrial fibrillation   Hypertension   Hypothyroidism    Time spent: 35 min    Benjamine Mola  JESSICA  [facility] Pager [phone] 7PM-7AM, please contact night-coverage at [email or web address], password [facility] 08/14/2013, 8:06 AM  LOS: 2 days

## 2013-08-15 LAB — GLUCOSE, CAPILLARY: Glucose-Capillary: 145 mg/dL — ABNORMAL HIGH (ref 70–99)

## 2013-08-15 LAB — CBC
Hemoglobin: 14.1 g/dL (ref 12.0–15.0)
MCH: 28.3 pg (ref 26.0–34.0)
RBC: 4.99 MIL/uL (ref 3.87–5.11)
RDW: 14.8 % (ref 11.5–15.5)
WBC: 4.9 10*3/uL (ref 4.0–10.5)

## 2013-08-15 MED ORDER — LEVOTHYROXINE SODIUM 25 MCG PO TABS
25.0000 ug | ORAL_TABLET | Freq: Every day | ORAL | Status: DC
  Administered 2013-08-16 – 2013-08-17 (×2): 25 ug via ORAL
  Filled 2013-08-15 (×3): qty 1

## 2013-08-15 MED ORDER — FLECAINIDE ACETATE 100 MG PO TABS
100.0000 mg | ORAL_TABLET | Freq: Two times a day (BID) | ORAL | Status: DC
  Administered 2013-08-15 – 2013-08-17 (×5): 100 mg via ORAL
  Filled 2013-08-15 (×7): qty 1

## 2013-08-15 MED ORDER — SIMVASTATIN 10 MG PO TABS
10.0000 mg | ORAL_TABLET | Freq: Every evening | ORAL | Status: DC
  Administered 2013-08-15 – 2013-08-16 (×2): 10 mg via ORAL
  Filled 2013-08-15 (×3): qty 1

## 2013-08-15 MED ORDER — METOPROLOL TARTRATE 50 MG PO TABS
50.0000 mg | ORAL_TABLET | Freq: Two times a day (BID) | ORAL | Status: DC
Start: 2013-08-15 — End: 2013-08-17
  Administered 2013-08-15 – 2013-08-17 (×5): 50 mg via ORAL
  Filled 2013-08-15 (×6): qty 1

## 2013-08-15 NOTE — Progress Notes (Signed)
  Subjective: Had 2 bouts of diarrhea, minimal pain, tolerated clears.    Objective: Vital signs in last 24 hours: Temp:  [97.5 F (36.4 C)-98 F (36.7 C)] 97.5 F (36.4 C) (12/14 0523) Pulse Rate:  [60-82] 82 (12/14 0523) Resp:  [18-20] 20 (12/14 0523) BP: (127-168)/(56-73) 127/73 mmHg (12/14 0523) SpO2:  [95 %-98 %] 95 % (12/14 0523) Weight:  [286 lb 6 oz (129.9 kg)] 286 lb 6 oz (129.9 kg) (12/14 0523) Last BM Date: 08/13/13  Intake/Output from previous day: 12/13 0701 - 12/14 0700 In: 621 [P.O.:621] Out: 1170 [Urine:1170] Intake/Output this shift:   PE General appearance: alert, cooperative and no distress  GI: bowel sounds are present, abodmen is soft, non distended. She exhibits minimal  tenderness to LLQ with deep palpation.  Lab Results:   Recent Labs  08/14/13 0412 08/15/13 0655  WBC 5.4 4.9  HGB 12.4 14.1  HCT 37.4 42.2  PLT 231 330   BMET  Recent Labs  08/13/13 0038 08/13/13 0648  NA 136 135  K 3.5 3.5  CL 98 98  CO2 27 24  GLUCOSE 93 103*  BUN 25* 22  CREATININE 1.01 0.94  CALCIUM 8.9 8.5   PT/INR  Recent Labs  08/13/13 0038 08/13/13 0648  LABPROT 19.7* 19.5*  INR 1.72* 1.70*   ABG No results found for this basename: PHART, PCO2, PO2, HCO3,  in the last 72 hours  Studies/Results: No results found.  Anti-infectives: Anti-infectives   Start     Dose/Rate Route Frequency Ordered Stop   08/13/13 0800  piperacillin-tazobactam (ZOSYN) IVPB 3.375 g     3.375 g 12.5 mL/hr over 240 Minutes Intravenous Every 8 hours 08/13/13 0520     08/13/13 0045  piperacillin-tazobactam (ZOSYN) IVPB 3.375 g     3.375 g 100 mL/hr over 30 Minutes Intravenous  Once 08/13/13 0039 08/13/13 0240   08/13/13 0045  metroNIDAZOLE (FLAGYL) IVPB 500 mg  Status:  Discontinued     500 mg 100 mL/hr over 60 Minutes Intravenous  Once 08/13/13 0039 08/13/13 0054      Assessment/Plan: Sigmoid diverticulitis with microperforation  Improving, minimally tender, WBC  remains normal.  Advance to full liquid diet, continue with zosyn. ? Repeat CT in AM Mobilize   LOS: 3 days   Brazil Voytko ANP-BC 08/15/2013 9:23 AM

## 2013-08-15 NOTE — Progress Notes (Signed)
Pt resting comfortable on bed on RA, pt turns from 1 AVB to A fib on monitor, no distress noticed.

## 2013-08-15 NOTE — Progress Notes (Signed)
Pt tolerating full liquid lunch without nausea or abd pain. Up ambulating in hallway. 3 episodes of loose stool.

## 2013-08-15 NOTE — Progress Notes (Signed)
Mild nausea, but pain is better. WBC normal  Diverticulitis with microperforation and small abscess  Continue abx Advance diet slowly.  Wilmon Arms. Corliss Skains, MD, [facility]  General/ Trauma Surgery  08/15/2013 10:30 AM

## 2013-08-15 NOTE — Progress Notes (Addendum)
[facility] PROGRESS NOTE  Mckenzie Frank:[id number] DOB: [birth year] DOA: 08/12/2013 PCP: Mckenzie Boyden, MD  Assessment/Plan: Sigmoid diverticulitis with possible microperforation and abscess formation  Zosyn.  Clears- advance per surgery  Pain medications   IV gentle hydration.  -Further recommendations per surgery.   History of atrial fibrillation presently rate controlled  - hold Coumadin in anticipation of possible surgery- will resume after CT scan if no surgery planned -change to oral meds -lovenox bridge   Hypothyroidism - - Synthroid.   Hypertension -  -IV hydralazine for systolic blood pressure more than 180  Leukocytosis -resolved  AKI -resolved  Code Status: full Family Communication: patient Disposition Plan:    Consultants:  surgery  Procedures:    Antibiotics:  Zosyn 12/12  HPI/Subjective: Tolerating liquids Little nausea this AM  Objective: Filed Vitals:   08/15/13 0523  BP: 127/73  Pulse: 82  Temp: 97.5 F (36.4 C)  Resp: 20    Intake/Output Summary (Last 24 hours) at 08/15/13 0818 Last data filed at 08/15/13 0538  Gross per 24 hour  Intake    621 ml  Output   1170 ml  Net   -549 ml   Filed Weights   08/13/13 0335 08/14/13 0527 08/15/13 0523  Weight: 131.044 kg (288 lb 14.4 oz) 129.547 kg (285 lb 9.6 oz) 129.9 kg (286 lb 6 oz)    Exam:   General:  A+Ox3, NAD  Cardiovascular: rrr  Respiratory: clear  Abdomen: +BS, soft  Musculoskeletal: moves all 4 ext   Data Reviewed: Basic Metabolic Panel:  Recent Labs Lab 08/12/13 1853 08/13/13 0038 08/13/13 0648  NA 139 136 135  K 4.2 3.5 3.5  CL 100 98 98  CO2 30 27 24   GLUCOSE 95 93 103*  BUN 26* 25* 22  CREATININE 1.14* 1.01 0.94  CALCIUM 9.1 8.9 8.5   Liver Function Tests:  Recent Labs Lab 08/13/13 0038 08/13/13 0648  AST 25 22  ALT 26 22  ALKPHOS 141* 135*  BILITOT 1.3* 1.4*  PROT 7.8 6.8  ALBUMIN 3.9 3.3*   No results found for  this basename: LIPASE, AMYLASE,  in the last 168 hours No results found for this basename: AMMONIA,  in the last 168 hours CBC:  Recent Labs Lab 08/12/13 1853 08/13/13 0038 08/13/13 0648 08/14/13 0412 08/15/13 0655  WBC 13.1* 13.2* 9.3 5.4 4.9  NEUTROABS 9.9* 9.4* 6.7  --   --   HGB 13.5 13.8 13.0 12.4 14.1  HCT 39.2 40.7 38.8 37.4 42.2  MCV 80.7 85.7 84.3 85.4 84.6  PLT 287 263 219 231 330   Cardiac Enzymes:  Recent Labs Lab 08/13/13 0038  TROPONINI <0.30   BNP (last 3 results) No results found for this basename: PROBNP,  in the last 8760 hours CBG:  Recent Labs Lab 08/14/13 0619 08/14/13 1056 08/14/13 1655 08/14/13 2350 08/15/13 0606  GLUCAP 102* 85 81 94 145*    No results found for this or any previous visit (from the past 240 hour(s)).   Studies: No results found.  Scheduled Meds: . enoxaparin (LOVENOX) injection  130 mg Subcutaneous Q12H  . flecainide  100 mg Oral BID  . [START ON 08/16/2013] levothyroxine  25 mcg Oral QAC breakfast  . metoprolol  50 mg Oral BID  . piperacillin-tazobactam (ZOSYN)  IV  3.375 g Intravenous Q8H  . simvastatin  10 mg Oral QPM  . sodium chloride  3 mL Intravenous Q12H  - Continuous Infusions: . sodium chloride 10 mL/hr at  08/13/13 0542  . dextrose 5 % and 0.9% NaCl 50 mL/hr at 08/13/13 1128    Principal Problem:   Diverticulitis Active Problems:   Atrial fibrillation   Hypertension   Hypothyroidism   AKI (acute kidney injury)   Leukocytosis, unspecified    Time spent: 35 min    Benjamine Mola [name]  [facility] Pager [phone] 7PM-7AM, please contact night-coverage at [email or web address], password [facility] 08/15/2013, 8:18 AM  LOS: 3 days

## 2013-08-16 LAB — CBC
HCT: 36.5 % (ref 36.0–46.0)
MCH: 27.7 pg (ref 26.0–34.0)
MCHC: 32.6 g/dL (ref 30.0–36.0)
Platelets: 266 10*3/uL (ref 150–400)
RBC: 4.29 MIL/uL (ref 3.87–5.11)
RDW: 14.8 % (ref 11.5–15.5)
WBC: 4.7 10*3/uL (ref 4.0–10.5)

## 2013-08-16 LAB — BASIC METABOLIC PANEL
BUN: 9 mg/dL (ref 6–23)
CO2: 26 mEq/L (ref 19–32)
Calcium: 8.7 mg/dL (ref 8.4–10.5)
Creatinine, Ser: 0.94 mg/dL (ref 0.50–1.10)
GFR calc non Af Amer: 58 mL/min — ABNORMAL LOW (ref >90)
Glucose, Bld: 95 mg/dL (ref 70–99)
Sodium: 138 mEq/L (ref 135–145)

## 2013-08-16 LAB — GLUCOSE, CAPILLARY
Glucose-Capillary: 119 mg/dL — ABNORMAL HIGH (ref 70–99)
Glucose-Capillary: 85 mg/dL (ref 70–99)
Glucose-Capillary: 91 mg/dL (ref 70–99)

## 2013-08-16 MED ORDER — WARFARIN SODIUM 5 MG PO TABS
5.0000 mg | ORAL_TABLET | Freq: Once | ORAL | Status: AC
  Administered 2013-08-16: 5 mg via ORAL
  Filled 2013-08-16: qty 1

## 2013-08-16 MED ORDER — WARFARIN - PHARMACIST DOSING INPATIENT: Freq: Every day | Status: DC

## 2013-08-16 MED ORDER — AMOXICILLIN-POT CLAVULANATE 875-125 MG PO TABS
1.0000 | ORAL_TABLET | Freq: Two times a day (BID) | ORAL | Status: DC
  Administered 2013-08-16 – 2013-08-17 (×3): 1 via ORAL
  Filled 2013-08-16 (×4): qty 1

## 2013-08-16 MED ORDER — HYDROCODONE-ACETAMINOPHEN 5-325 MG PO TABS: 1.0000 | ORAL_TABLET | Freq: Four times a day (QID) | ORAL | Status: DC | PRN

## 2013-08-16 NOTE — Progress Notes (Signed)
Utilization Review Completed Yehuda Printup J. Tamia Dial, RN, BSN, NCM [phone]  

## 2013-08-16 NOTE — Care Management Note (Addendum)
  Page 1 of 1   08/16/2013     11:10:05 AM   CARE MANAGEMENT NOTE 08/16/2013  Patient:  Mckenzie Frank, Mckenzie Frank   Account Number:  [id number]  Date Initiated:  08/16/2013  Documentation initiated by:  [facility]  Subjective/Objective Assessment:   72 y.o. female with history of atrial fibrillation, hypertension, hypothyroidism has been experiencing abdominal pain over the last one month.//Home alone     Action/Plan:   Pain relief medications and IV gentle hydration.//Home with self care; benefits check for Lovenox   Anticipated DC Date:  08/17/2013   Anticipated DC Plan:  HOME/SELF CARE      DC Planning Services  CM consult      Choice offered to / List presented to:             Status of service:   Medicare Important Message given?   (If response is "NO", the following Medicare IM given date fields will be blank) Date Medicare IM given:   Date Additional Medicare IM given:    Discharge Disposition:    Per UR Regulation:    If discussed at Long Length of Stay Meetings, dates discussed:    Comments:  08/16/13 1045 Georgeana Oertel, RN, BSN, [employer] [phone] Spoke with pt at bedside regarding benefits check for Lovenox 150mg .  Pt has brochure with 30 day free card and refill assistance card intact.  Pt utilizes [employer] [facility] for prescription needs.  NCM called pharmacy to confirm availability of medication.  Information relayed to pt.  Pt verbalizes importance of filling medication upon discharge. Co-pay will be $20.00

## 2013-08-16 NOTE — Progress Notes (Addendum)
[facility] PROGRESS NOTE  Mckenzie Frank NWG:[id number] DOB: 1941-07-23 DOA: 08/12/2013 PCP: Eustaquio Boyden, MD  Assessment/Plan: Sigmoid diverticulitis with possible microperforation and abscess formation  Zosyn day #5- change to augmentin with stop date of: 12/25 Tolerating diet- advance to low residue Pain medications   IV gentle hydration.  -colonoscopy 6 weeks  History of atrial fibrillation presently rate controlled  - resume coumadin -change to oral meds -lovenox bridge   Hypothyroidism - - Synthroid.   Hypertension -  -IV hydralazine for systolic blood pressure more than 180  Leukocytosis -resolved  AKI -resolved  Code Status: full Family Communication: patient Disposition Plan:    Consultants:  surgery  Procedures:    Antibiotics:  Zosyn 12/12-12/15  augmentin 12/15- 12/25  HPI/Subjective: Tolerating liquids Little nausea this AM  Objective: Filed Vitals:   08/16/13 0552  BP: 158/79  Pulse: 62  Temp: 97.4 F (36.3 C)  Resp: 20    Intake/Output Summary (Last 24 hours) at 08/16/13 0758 Last data filed at 08/16/13 0557  Gross per 24 hour  Intake    360 ml  Output    725 ml  Net   -365 ml   Filed Weights   08/14/13 0527 08/15/13 0523 08/16/13 0552  Weight: 129.547 kg (285 lb 9.6 oz) 129.9 kg (286 lb 6 oz) 129.8 kg (286 lb 2.5 oz)    Exam:   General:  A+Ox3, NAD  Abdomen: +BS, soft  Musculoskeletal: moves all 4 ext ; min edema  Data Reviewed: Basic Metabolic Panel:  Recent Labs Lab 08/12/13 1853 08/13/13 0038 08/13/13 0648 08/16/13 0340  NA 139 136 135 138  K 4.2 3.5 3.5 4.2  CL 100 98 98 102  CO2 30 27 24 26   GLUCOSE 95 93 103* 95  BUN 26* 25* 22 9  CREATININE 1.14* 1.01 0.94 0.94  CALCIUM 9.1 8.9 8.5 8.7   Liver Function Tests:  Recent Labs Lab 08/13/13 0038 08/13/13 0648  AST 25 22  ALT 26 22  ALKPHOS 141* 135*  BILITOT 1.3* 1.4*  PROT 7.8 6.8  ALBUMIN 3.9 3.3*   No results found for  this basename: LIPASE, AMYLASE,  in the last 168 hours No results found for this basename: AMMONIA,  in the last 168 hours CBC:  Recent Labs Lab 08/12/13 1853 08/13/13 0038 08/13/13 0648 08/14/13 0412 08/15/13 0655 08/16/13 0340  WBC 13.1* 13.2* 9.3 5.4 4.9 4.7  NEUTROABS 9.9* 9.4* 6.7  --   --   --   HGB 13.5 13.8 13.0 12.4 14.1 11.9*  HCT 39.2 40.7 38.8 37.4 42.2 36.5  MCV 80.7 85.7 84.3 85.4 84.6 85.1  PLT 287 263 219 231 330 266   Cardiac Enzymes:  Recent Labs Lab 08/13/13 0038  TROPONINI <0.30   BNP (last 3 results) No results found for this basename: PROBNP,  in the last 8760 hours CBG:  Recent Labs Lab 08/14/13 2350 08/15/13 0606 08/15/13 1133 08/16/13 0003 08/16/13 0614  GLUCAP 94 145* 94 91 102*    No results found for this or any previous visit (from the past 240 hour(s)).   Studies: No results found.  Scheduled Meds: . amoxicillin-clavulanate  1 tablet Oral Q12H  . enoxaparin (LOVENOX) injection  130 mg Subcutaneous Q12H  . flecainide  100 mg Oral BID  . levothyroxine  25 mcg Oral QAC breakfast  . metoprolol  50 mg Oral BID  . simvastatin  10 mg Oral QPM  . sodium chloride  3 mL Intravenous Q12H  -  Continuous Infusions: . sodium chloride 10 mL/hr at 08/13/13 4782    Principal Problem:   Diverticulitis Active Problems:   Atrial fibrillation   Hypertension   Hypothyroidism   AKI (acute kidney injury)   Leukocytosis, unspecified    Time spent: 35 min    Benjamine Mola Anaia Frith  [facility] Pager [phone] 7PM-7AM, please contact night-coverage at [email or web address], password [facility] 08/16/2013, 7:58 AM  LOS: 4 days

## 2013-08-16 NOTE — Progress Notes (Signed)
I will see her back in the office. Agree. Violeta Gelinas, MD, MPH, FACS Pager: [phone]

## 2013-08-16 NOTE — Progress Notes (Signed)
  Subjective: No diarrhea, minimal pain.  Ambulating in hallways.  Tolerating full liquids.    Objective: Vital signs in last 24 hours: Temp:  [97.4 F (36.3 C)-97.8 F (36.6 C)] 97.4 F (36.3 C) (12/15 0552) Pulse Rate:  [62-80] 62 (12/15 0552) Resp:  [20] 20 (12/15 0552) BP: (156-158)/(59-84) 158/79 mmHg (12/15 0552) SpO2:  [94 %-100 %] 94 % (12/15 0552) Weight:  [286 lb 2.5 oz (129.8 kg)] 286 lb 2.5 oz (129.8 kg) (12/15 0552) Last BM Date: 08/13/13  Intake/Output from previous day: 12/14 0701 - 12/15 0700 In: 360 [P.O.:360] Out: 725 [Urine:725] Intake/Output this shift:   PE  General appearance: alert, cooperative and no distress  GI: bowel sounds are present, abodmen is soft, non distended. She exhibits minimal tenderness to LLQ with deep palpation.   Lab Results:   Recent Labs  08/15/13 0655 08/16/13 0340  WBC 4.9 4.7  HGB 14.1 11.9*  HCT 42.2 36.5  PLT 330 266   BMET  Recent Labs  08/16/13 0340  NA 138  K 4.2  CL 102  CO2 26  GLUCOSE 95  BUN 9  CREATININE 0.94  CALCIUM 8.7    Anti-infectives: Anti-infectives   Start     Dose/Rate Route Frequency Ordered Stop   08/13/13 0800  piperacillin-tazobactam (ZOSYN) IVPB 3.375 g     3.375 g 12.5 mL/hr over 240 Minutes Intravenous Every 8 hours 08/13/13 0520     08/13/13 0045  piperacillin-tazobactam (ZOSYN) IVPB 3.375 g     3.375 g 100 mL/hr over 30 Minutes Intravenous  Once 08/13/13 0039 08/13/13 0240   08/13/13 0045  metroNIDAZOLE (FLAGYL) IVPB 500 mg  Status:  Discontinued     500 mg 100 mL/hr over 60 Minutes Intravenous  Once 08/13/13 0039 08/13/13 0054      Assessment/Plan: Sigmoid diverticulitis with microperforation  -advance to low fiber diet -nutrition consult -change to augmentin, total of 14 days of antibiotics -home with low residue diet for 1-2 weeks -she will need a colonoscopy in 6 weeks -she is to follow up with Dr. Violeta Gelinas in 3-4 weeks -stable for discharge today from  surgical standpoint    LOS: 4 days    Keili Hasten ANP-BC 08/16/2013 7:48 AM

## 2013-08-16 NOTE — Progress Notes (Signed)
Pt says she has never given herself Lovenox inj. Started teaching with pt and let MD know.  Will continue to monitor.

## 2013-08-16 NOTE — Plan of Care (Signed)
Problem: Food- and Nutrition-Related Knowledge Deficit (NB-1.1) Goal: Nutrition education Formal process to instruct or train a patient/client in a skill or to impart knowledge to help patients/clients voluntarily manage or modify food choices and eating behavior to maintain or improve health. Outcome: Completed/Met Date Met:  08/16/13  RD consulted for nutrition education regarding Low Residue Diet. RD provided "Sample Menus for Gradually Increasing Fiber" handout from the [employer]. Discussed different food groups and their fiber content, emphasizing gradual addition of higher fiber-containing foods as symptoms resolve. Provided list of goods to eat and foods to avoid. Teach back method used.  Expect good compliance.  Body mass index is 44.81 kg/(m^2). Pt meets criteria for Obese Class III weight based on current BMI.  Current diet order is Low Fiber. Labs and medications reviewed. No further nutrition interventions warranted at this time. RD contact information provided. If additional nutrition issues arise, please re-consult RD.  Jarold Motto MS, RD, LDN Pager: [phone] After-hours pager: [phone]

## 2013-08-16 NOTE — Progress Notes (Addendum)
ANTICOAGULATION CONSULT NOTE - Initial Consult  Pharmacy Consult for Coumadin and lovenox  Indication: atrial fibrillation  Allergies  Allergen Reactions  . Clindamycin/Lincomycin   . Codeine   . Doxycycline   . Paxil [Paroxetine Hcl]   . Promethazine   . Sulfa Antibiotics   . Valium     Patient Measurements: Height: 5\' 7"  (170.2 cm) Weight: 286 lb 2.5 oz (129.8 kg) (scale B) IBW/kg (Calculated) : 61.6  Vital Signs: Temp: 97.4 F (36.3 C) (12/15 0552) Temp src: Oral (12/15 0552) BP: 158/79 mmHg (12/15 0552) Pulse Rate: 62 (12/15 0552)  Labs:  Recent Labs  08/14/13 0412 08/15/13 0655 08/16/13 0340  HGB 12.4 14.1 11.9*  HCT 37.4 42.2 36.5  PLT 231 330 266  CREATININE  --   --  0.94    Estimated Creatinine Clearance: 73.7 ml/min (by C-G formula based on Cr of 0.94).   Medical History: Past Medical History  Diagnosis Date  . Hyperlipidemia   . Hypertension   . Hypothyroidism     borderline  . Osteoarthritis of knee     s/p replacement  . Breast mass     left  . Benign head tremor   . Atrial fibrillation 2010    s/p DC cardioversion. on coumadin  . Insomnia   . Family history of anesthesia complication     daughter   nausea  . GERD (gastroesophageal reflux disease)     Medications:  Scheduled:  . amoxicillin-clavulanate  1 tablet Oral Q12H  . enoxaparin (LOVENOX) injection  130 mg Subcutaneous Q12H  . flecainide  100 mg Oral BID  . levothyroxine  25 mcg Oral QAC breakfast  . metoprolol  50 mg Oral BID  . simvastatin  10 mg Oral QPM  . sodium chloride  3 mL Intravenous Q12H  . warfarin  5 mg Oral ONCE-1800  . Warfarin - Pharmacist Dosing Inpatient   Does not apply q1800    Assessment: 63 yoF with hx of afib, HTN, hypothyroidism admitted for abd pain over last month. Acute worsening, CAT scan showed sigmoid diverticulitis with possible perforation and abscess formation. Currently being treated with augmentin to stop 12/25. Tolerating CL,  advancing to low residue diet.   Coum PTA for AFib, INR 1.7 on 12/12. SCDs. Pt takes alternating 2.5/5mg  dose but cannot clearly tell me dose. Verified with [facility]- 5mg  daily x 2.5mg  MWF. Hg & pltc wnl  Currently on enoxaparin 130 mg q12h (current wt: 129.8kg)  Goal of Therapy:  INR 2-3 Monitor platelets by anticoagulation protocol: Yes   Plan:  Coumadin 5mg  x 1 tonight Daily INR Continue Lovenox until INR > 2   Thank you for allowing pharmacy to take part in the care of this patient,   Britt Bottom B. Artelia Laroche, PharmD Clinical Pharmacist - Resident Pager: [phone] Phone: [phone] 08/16/2013 8:34 AM

## 2013-08-17 ENCOUNTER — Telehealth: Payer: Self-pay | Admitting: Family Medicine

## 2013-08-17 LAB — PROTIME-INR
INR: 1.26 (ref 0.00–1.49)
Prothrombin Time: 15.5 seconds — ABNORMAL HIGH (ref 11.6–15.2)

## 2013-08-17 LAB — BASIC METABOLIC PANEL
CO2: 27 mEq/L (ref 19–32)
Chloride: 103 mEq/L (ref 96–112)
GFR calc non Af Amer: 71 mL/min — ABNORMAL LOW (ref >90)
Glucose, Bld: 99 mg/dL (ref 70–99)
Potassium: 4.2 mEq/L (ref 3.5–5.1)
Sodium: 140 mEq/L (ref 135–145)

## 2013-08-17 LAB — GLUCOSE, CAPILLARY

## 2013-08-17 LAB — CBC
HCT: 37.2 % (ref 36.0–46.0)
Hemoglobin: 12.2 g/dL (ref 12.0–15.0)
Platelets: 284 10*3/uL (ref 150–400)
RBC: 4.31 MIL/uL (ref 3.87–5.11)
WBC: 6.3 10*3/uL (ref 4.0–10.5)

## 2013-08-17 MED ORDER — AMOXICILLIN-POT CLAVULANATE 875-125 MG PO TABS: 1.0000 | ORAL_TABLET | Freq: Two times a day (BID) | ORAL | Status: DC

## 2013-08-17 MED ORDER — ONDANSETRON HCL 4 MG PO TABS: 4.0000 mg | ORAL_TABLET | Freq: Four times a day (QID) | ORAL | Status: DC | PRN

## 2013-08-17 MED ORDER — ENOXAPARIN SODIUM 120 MG/0.8ML ~~LOC~~ SOLN: 120.0000 mg | Freq: Two times a day (BID) | SUBCUTANEOUS | Status: DC

## 2013-08-17 MED ORDER — HYDROCODONE-ACETAMINOPHEN 5-325 MG PO TABS: 1.0000 | ORAL_TABLET | Freq: Four times a day (QID) | ORAL | Status: DC | PRN

## 2013-08-17 NOTE — Telephone Encounter (Signed)
Pt discharged today 12/16  from hospital for complicated diverticulitis - plz call to ensure pt on augmentin oral and schedule f/u appt in next 1 week

## 2013-08-17 NOTE — Discharge Summary (Signed)
Physician Discharge Summary  HUE STEVESON WUJ:[id number] DOB: 11-Oct-1940 DOA: 08/12/2013  PCP: Eustaquio Boyden, MD  Admit date: 08/12/2013 Discharge date: 08/17/2013  Time spent: 35 minutes  Recommendations for Outpatient Follow-up:  1. lovenox to coumadin- PT/INr check on Thursday 2. colonoscopy as outpatient 3. 2 weeks total abx  Discharge Diagnoses:  Principal Problem:   Diverticulitis Active Problems:   Atrial fibrillation   Hypertension   Hypothyroidism   AKI (acute kidney injury)   Leukocytosis, unspecified   Discharge Condition: improved  Diet recommendation: cardiac  Filed Weights   08/15/13 0523 08/16/13 0552 08/17/13 0618  Weight: 129.9 kg (286 lb 6 oz) 129.8 kg (286 lb 2.5 oz) 130.364 kg (287 lb 6.4 oz)    History of present illness:  Mckenzie Frank is a 72 y.o. female with history of atrial fibrillation, hypertension, hypothyroidism has been experiencing abdominal pain over the last one month. Over the last 3 days it acutely worsened. Patient did not have any nausea vomiting diarrhea. Has not been on any antibiotics recently. The abdominal pain was mostly in the left lower quadrant. The pain worsened so much that patient was not able to ambulate. Patient went to her PCP yesterday and had a CAT scan which showed sigmoid diverticulitis with possible abscess and microperforation. Patient was referred to the ER. On-call surgeon Dr. Janee Morn was consulted by the ER physician and patient has been admitted for further workup. Patient otherwise denies any fever chills chest pain shortness of breath.    Hospital Course:  Sigmoid diverticulitis with possible microperforation and abscess formation  Zosyn day #5- change to augmentin with stop date of: 12/25  Tolerating diet- advance to low residue  Pain medications  -colonoscopy 6 weeks   History of atrial fibrillation presently rate controlled  - resume coumadin  -change to oral meds  -lovenox bridge- patient did  not want to waste- wll give 120 mg  Hypothyroidism -  - Synthroid.   Hypertension -  -resume home meds  Leukocytosis  -resolved   AKI  -resolved   Procedures:  none  Consultations:  surgery  Discharge Exam: Filed Vitals:   08/17/13 0618  BP: 149/62  Pulse: 57  Temp: 98.3 F (36.8 C)  Resp: 20    General: A+Ox3, NAD Cardiovascular: rrr Respiratory: clear anterior  Discharge Instructions       Future Appointments Provider Department Dept Phone   08/19/2013 11:00 AM [facility] [phone]   09/06/2013 9:00 AM [facility] [phone]       Medication List         amLODipine-benazepril 10-40 MG per capsule  Commonly known as:  LOTREL  Take 1 capsule by mouth daily.     amoxicillin-clavulanate 875-125 MG per tablet  Commonly known as:  AUGMENTIN  Take 1 tablet by mouth every 12 (twelve) hours.     clonazePAM 0.5 MG tablet  Commonly known as:  KLONOPIN  TAKE 1 TABLET BY MOUTH EVERY NIGHT AT BEDTIME     enoxaparin 120 MG/0.8ML injection  Commonly known as:  LOVENOX  Inject 0.8 mLs (120 mg total) into the skin every 12 (twelve) hours.     flecainide 100 MG tablet  Commonly known as:  TAMBOCOR  Take 1 tablet (100 mg total) by mouth 2 (two) times daily.     furosemide 40 MG tablet  Commonly known as:  LASIX  Take 1 tablet (40 mg total) by mouth daily.  2nd dose prn     HYDROcodone-acetaminophen 5-325 MG per tablet  Commonly known as:  NORCO/VICODIN  Take 1 tablet by mouth every 6 (six) hours as needed for moderate pain or severe pain.     lansoprazole 15 MG capsule  Commonly known as:  PREVACID  Take 1 capsule (15 mg total) by mouth daily.     levothyroxine 25 MCG tablet  Commonly known as:  SYNTHROID, LEVOTHROID  Take 1 tablet (25 mcg total) by mouth daily.     metoprolol 50 MG tablet  Commonly known as:  LOPRESSOR  Take 1 tablet (50 mg total)  by mouth 2 (two) times daily.     ondansetron 4 MG tablet  Commonly known as:  ZOFRAN  Take 1 tablet (4 mg total) by mouth every 6 (six) hours as needed for nausea.     simvastatin 20 MG tablet  Commonly known as:  ZOCOR  Take 0.5 tablets (10 mg total) by mouth every evening.     warfarin 5 MG tablet  Commonly known as:  COUMADIN  Take as directed by Coumadin Clinic.       Allergies  Allergen Reactions  . Clindamycin/Lincomycin   . Codeine   . Doxycycline   . Paxil [Paroxetine Hcl]   . Promethazine   . Sulfa Antibiotics   . Valium    Follow-up Information   Schedule an appointment as soon as possible for a visit with Liz Malady, MD. (3-4 weeks)    Specialty:  General Surgery   Contact information:   [address] [place] [address] [phone]       Follow up with Eustaquio Boyden, MD In 1 week. (INR check on Thurs 18th at 11AM)    Specialty:  Family Medicine   Contact information:   [address] [place] [address] [phone]        The results of significant diagnostics from this hospitalization (including imaging, microbiology, ancillary and laboratory) are listed below for reference.    Significant Diagnostic Studies: Ct Abdomen Pelvis W Contrast  08/12/2013   CLINICAL DATA:  Lower abdominal pain. Previous hysterectomy, appendectomy, cholecystectomy.  EXAM: CT ABDOMEN AND PELVIS WITH CONTRAST  TECHNIQUE: Multidetector CT imaging of the abdomen and pelvis was performed using the standard protocol following bolus administration of intravenous contrast.  CONTRAST:  OMNIPAQUE IOHEXOL 300 MG/ML  SOLN  COMPARISON:  CT of the abdomen and pelvis on 06/24/2009  FINDINGS: Lung bases are negative. There is mild intra and extrahepatic biliary ductal dilatation following cholecystectomy, a normal variant. No focal lesions identified within the liver, spleen, pancreas, adrenal glands, or kidneys.  Note is made of a hiatal hernia.  Otherwise, the stomach has a normal appearance. Small bowel loops are normal in caliber. The appendix is surgically absent. The sigmoid colon is notable for significant mural thickening and inflammatory change. Numerous colonic diverticula are identified within this segment. Findings are consistent with acute diverticulitis. Just superior to the sigmoid segment, there is an irregular collection of fluid and air, measuring 2.1 x 2.0 cm, consistent with early abscess. There are locules of gas adjacent to the sigmoid colon, consistent with micro perforation. There is significant mesenteric fluid. Fluid abuts the left ovary which contains a coarse calcification, unchanged in appearance and measuring 1.9 cm. This could represent a small ovarian dermoid. The right ovary has a normal appearance. The uterus is surgically absent.  There are significant degenerative changes throughout the lower thoracic and lumbar  spine. No suspicious lytic or blastic lesions are identified.  IMPRESSION: 1. Significant changes of sigmoid diverticulitis. 2. Small area of micro perforation and possible early abscess just superior to the sigmoid segment, measuring 2.1 cm in diameter. 3. 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 4. Status post cholecystectomy, hysterectomy. 5. The findings were discussed with Ernest Haber, M.D. on 08/12/2013 at 11:14 PM. 6. I discussed the findings with the patient and her daughter. She will be evaluated in the emergency department.   Electronically Signed   By: Rosalie Gums M.D.   On: 08/12/2013 23:37   Dg Chest Port 1 View  08/13/2013   CLINICAL DATA:  Preop.  History of hypertension.  EXAM: PORTABLE CHEST - 1 VIEW  COMPARISON:  06/22/2010  FINDINGS: The heart is enlarged. The lungs are free of focal consolidations and pleural effusions. No pulmonary edema.  IMPRESSION: Cardiomegaly.   Electronically Signed   By: Rosalie Gums M.D.   On: 08/13/2013 01:17    Microbiology: No results  found for this or any previous visit (from the past 240 hour(s)).   Labs: Basic Metabolic Panel:  Recent Labs Lab 08/12/13 1853 08/13/13 0038 08/13/13 0648 08/16/13 0340 08/17/13 0510  NA 139 136 135 138 140  K 4.2 3.5 3.5 4.2 4.2  CL 100 98 98 102 103  CO2 30 27 24 26 27   GLUCOSE 95 93 103* 95 99  BUN 26* 25* 22 9 12   CREATININE 1.14* 1.01 0.94 0.94 0.80  CALCIUM 9.1 8.9 8.5 8.7 8.8   Liver Function Tests:  Recent Labs Lab 08/13/13 0038 08/13/13 0648  AST 25 22  ALT 26 22  ALKPHOS 141* 135*  BILITOT 1.3* 1.4*  PROT 7.8 6.8  ALBUMIN 3.9 3.3*   No results found for this basename: LIPASE, AMYLASE,  in the last 168 hours No results found for this basename: AMMONIA,  in the last 168 hours CBC:  Recent Labs Lab 08/12/13 1853 08/13/13 0038 08/13/13 0648 08/14/13 0412 08/15/13 0655 08/16/13 0340 08/17/13 0510  WBC 13.1* 13.2* 9.3 5.4 4.9 4.7 6.3  NEUTROABS 9.9* 9.4* 6.7  --   --   --   --   HGB 13.5 13.8 13.0 12.4 14.1 11.9* 12.2  HCT 39.2 40.7 38.8 37.4 42.2 36.5 37.2  MCV 80.7 85.7 84.3 85.4 84.6 85.1 86.3  PLT 287 263 219 231 330 266 284   Cardiac Enzymes:  Recent Labs Lab 08/13/13 0038  TROPONINI <0.30   BNP: BNP (last 3 results) No results found for this basename: PROBNP,  in the last 8760 hours CBG:  Recent Labs Lab 08/16/13 0614 08/16/13 1210 08/16/13 1814 08/17/13 0013 08/17/13 0615  GLUCAP 102* 85 119* 110* 117*       Signed:  Ariyon Gerstenberger  [facility] 08/17/2013, 9:10 AM

## 2013-08-18 ENCOUNTER — Telehealth: Payer: Self-pay | Admitting: Family Medicine

## 2013-08-18 NOTE — Telephone Encounter (Signed)
Transitional Care Call.  Pt d/c'd from hospital on 08/17/13.  D/C dx: Diverticulitis.  Spoke with pt.  State that she is "feeling better" and "my stomach isn't hurting anymore".  Continues on oral Augmentin BID x 2 weeks.  Continues on Lovenox injections, recheck INR tomorrow in [facility]madin Clinic.  Colonoscopy scheduled for January.  Pt is able to perform ADLs independently.  She lives alone but has a daughter in [place] that she can all if needed.  Denies pain or discomfort.  Denies questions regarding d/c instructions or medication list.  No questions for Dr. Sharen Hones at this time.  Hospital follow up scheduled for 08/23/13.

## 2013-08-18 NOTE — Telephone Encounter (Signed)
Noted. Will see next week.

## 2013-08-18 NOTE — Telephone Encounter (Signed)
See TCC note. 

## 2013-08-19 ENCOUNTER — Ambulatory Visit (INDEPENDENT_AMBULATORY_CARE_PROVIDER_SITE_OTHER): Payer: Medicare Other | Admitting: Family Medicine

## 2013-08-19 ENCOUNTER — Telehealth: Payer: Self-pay | Admitting: Family Medicine

## 2013-08-19 DIAGNOSIS — Z7901 Long term (current) use of anticoagulants: Secondary | ICD-10-CM

## 2013-08-19 DIAGNOSIS — Z5181 Encounter for therapeutic drug level monitoring: Secondary | ICD-10-CM

## 2013-08-19 DIAGNOSIS — I4891 Unspecified atrial fibrillation: Secondary | ICD-10-CM

## 2013-08-19 NOTE — Telephone Encounter (Signed)
Patient Information:  Caller Name: Reita Cliche  Phone: [phone]  Patient: Mckenzie Frank, Mckenzie Frank  Gender: Female  DOB: 1941/08/26  Age: 72 Years  PCP: Eustaquio Boyden [facility])  Office Follow Up:  Does the office need to follow up with this patient?: Yes  Instructions For The Office: This pt. has noticed a significant amount of bruising on her abdomen the size of a volleyball. Please advise as she is taking the Lovenox injections. She feels fine otherwise.  RN Note:  This pt was in the hospital for diverticulitis and on looking at her abdomen this morning has noticed a large area of bruising present. Will sent note to the office to see what her Dr. would advise her to do.  Symptoms  Reason For Call & Symptoms: Just got home from the hospital 08/17/13 and on Lovenox injections. Now has noticed a lot of bruising on the abdomen. Worried about this.  Reviewed Health History In EMR: Yes  Reviewed Medications In EMR: Yes  Reviewed Allergies In EMR: Yes  Reviewed Surgeries / Procedures: Yes  Date of Onset of Symptoms: 08/19/2013  Guideline(s) Used:  No Protocol Available - Sick Adult  Disposition Per Guideline:   Discuss with PCP and Callback by Nurse Today  Reason For Disposition Reached:   Nursing judgment  Advice Given:  Call Back If:  New symptoms develop  You become worse.  Patient Will Follow Care Advice:  YES

## 2013-08-19 NOTE — Telephone Encounter (Signed)
Patient notified. She said she did have some slight hardening to the right side where she had gotten all the injections at the hospital, but it had not gotten any worse or any bigger. None at all noted on the left side. She will continue to monitor and if she notices anything new or worsening she will call or go to [facility].

## 2013-08-19 NOTE — Telephone Encounter (Signed)
lovenox can cause bruising. If bruising is just skin color change, should be ok - recommend alternating lovenox injection site. If associated with worsening hardening of skin underneath or worsening pain, recommend she be evaluated here or at urgent care. She still needs her lovenox injection as her INR level was only 1.1 today. Also watch for any other new symptoms developing.

## 2013-08-20 ENCOUNTER — Encounter: Payer: Self-pay | Admitting: Radiology

## 2013-08-23 ENCOUNTER — Ambulatory Visit (INDEPENDENT_AMBULATORY_CARE_PROVIDER_SITE_OTHER): Payer: Medicare Other | Admitting: Family Medicine

## 2013-08-23 ENCOUNTER — Encounter: Payer: Self-pay | Admitting: Family Medicine

## 2013-08-23 ENCOUNTER — Other Ambulatory Visit: Payer: Self-pay | Admitting: Family Medicine

## 2013-08-23 VITALS — BP 134/78 | HR 66 | Temp 98.1°F | Ht 65.5 in | Wt 290.5 lb

## 2013-08-23 DIAGNOSIS — Z5181 Encounter for therapeutic drug level monitoring: Secondary | ICD-10-CM

## 2013-08-23 DIAGNOSIS — R238 Other skin changes: Secondary | ICD-10-CM

## 2013-08-23 DIAGNOSIS — Z7901 Long term (current) use of anticoagulants: Secondary | ICD-10-CM

## 2013-08-23 DIAGNOSIS — I4891 Unspecified atrial fibrillation: Secondary | ICD-10-CM

## 2013-08-23 DIAGNOSIS — K5732 Diverticulitis of large intestine without perforation or abscess without bleeding: Secondary | ICD-10-CM

## 2013-08-23 MED ORDER — ENOXAPARIN SODIUM 120 MG/0.8ML ~~LOC~~ SOLN: 120.0000 mg | Freq: Two times a day (BID) | SUBCUTANEOUS | Status: DC

## 2013-08-23 NOTE — Assessment & Plan Note (Signed)
Zosyn in hospital, now continues on oral augmentin.  Recommended finish course. Slowly recovering, has f/u appt with surgery and for colonoscopy. Reviewed low residue diet she will continue for a full 2 weeks.

## 2013-08-23 NOTE — Assessment & Plan Note (Signed)
Continue coumadin with lovenox bridge. Seems regular today.

## 2013-08-23 NOTE — Patient Instructions (Signed)
I think you are doing well. The bruising is expected on lovenox.  Let's give this more time - if worsening please let us know sooner, otherwise return this Friday or next Monday for recheck of bruising. Continue augmentin as up to now. Good to see you today, call us with questions.

## 2013-08-23 NOTE — Progress Notes (Signed)
Subjective:    Patient ID: Mckenzie Frank, female    DOB: Apr 05, 1941, 72 y.o.   MRN: [id number]  HPI CC: transitional care appt   Seen here 08/12/2013 with lower abd pain - sent for urgent CT scan showing sigmoid diverticulitis with concern for abscess formation and microperf.  Admitted to hospital, followed by surgeons and improved with IV abx and bowel rest, without need for surgical intervention.  Placed on zosyn in hospital, changed to augmentin for outpatient treatment - still taking this medication.  Recommended colonoscopy in 6 weeks.  Now stooling normally, denies abdominal pain.  No fevers/chills.  Now eating low residue diet for a total of 2 weeks.  Afib - She has been on lovenox bridge while coumadin levels become therapeutic.  Recent INR 1.3.  Needs to continue lovenox. She has noticed increasing bruises.  Mainly from blood draws as well as from getting in and out of hospital bed. Scheduled for f/u appt with surgical clinic 09/07/2012 and afterwards for colonoscopy.  Lab Results  Component Value Date   CREATININE 0.80 08/17/2013   Lab Results  Component Value Date   ALT 22 08/13/2013   AST 22 08/13/2013   ALKPHOS 135* 08/13/2013   BILITOT 1.4* 08/13/2013    F/u phone call: 08/18/2013 Admit date: 08/12/2013  Discharge date: 08/17/2013  Time spent: 35 minutes  Recommendations for Outpatient Follow-up:  1. lovenox to coumadin- PT/INr check on Thursday 2. colonoscopy as outpatient 3. 2 weeks total abx Discharge Diagnoses:  Principal Problem:  Diverticulitis  Active Problems:  Atrial fibrillation  Hypertension  Hypothyroidism  AKI (acute kidney injury)  Leukocytosis, unspecified  Discharge Condition: improved  Diet recommendation: cardiac  History of present illness:  Mckenzie Frank is a 72 y.o. female with history of atrial fibrillation, hypertension, hypothyroidism has been experiencing abdominal pain over the last one month. Over the last 3 days it acutely  worsened. Patient did not have any nausea vomiting diarrhea. Has not been on any antibiotics recently. The abdominal pain was mostly in the left lower quadrant. The pain worsened so much that patient was not able to ambulate. Patient went to her PCP yesterday and had a CAT scan which showed sigmoid diverticulitis with possible abscess and microperforation. Patient was referred to the ER. On-call surgeon Dr. Janee Morn was consulted by the ER physician and patient has been admitted for further workup. Patient otherwise denies any fever chills chest pain shortness of breath.  Hospital Course:  Sigmoid diverticulitis with possible microperforation and abscess formation  Zosyn day #5- change to augmentin with stop date of: 12/25  Tolerating diet- advance to low residue  Pain medications  -colonoscopy 6 weeks  History of atrial fibrillation presently rate controlled  - resume coumadin  -change to oral meds  -lovenox bridge- patient did not want to waste- wll give 120 mg  Hypothyroidism -  - Synthroid.  Hypertension -  -resume home meds  Leukocytosis  -resolved  AKI  -resolved  Medication List  amLODipine-benazepril 10-40 MG per capsule   Commonly known as: LOTREL   Take 1 capsule by mouth daily.    amoxicillin-clavulanate 875-125 MG per tablet   Commonly known as: AUGMENTIN   Take 1 tablet by mouth every 12 (twelve) hours.    clonazePAM 0.5 MG tablet   Commonly known as: KLONOPIN   TAKE 1 TABLET BY MOUTH EVERY NIGHT AT BEDTIME    enoxaparin 120 MG/0.8ML injection   Commonly known as: LOVENOX   Inject 0.8 mLs (  120 mg total) into the skin every 12 (twelve) hours.    flecainide 100 MG tablet   Commonly known as: TAMBOCOR   Take 1 tablet (100 mg total) by mouth 2 (two) times daily.    furosemide 40 MG tablet   Commonly known as: LASIX   Take 1 tablet (40 mg total) by mouth daily. 2nd dose prn    HYDROcodone-acetaminophen 5-325 MG per tablet   Commonly known as: NORCO/VICODIN     Take 1 tablet by mouth every 6 (six) hours as needed for moderate pain or severe pain.    lansoprazole 15 MG capsule   Commonly known as: PREVACID   Take 1 capsule (15 mg total) by mouth daily.    levothyroxine 25 MCG tablet   Commonly known as: SYNTHROID, LEVOTHROID   Take 1 tablet (25 mcg total) by mouth daily.    metoprolol 50 MG tablet   Commonly known as: LOPRESSOR   Take 1 tablet (50 mg total) by mouth 2 (two) times daily.    ondansetron 4 MG tablet   Commonly known as: ZOFRAN   Take 1 tablet (4 mg total) by mouth every 6 (six) hours as needed for nausea.    simvastatin 20 MG tablet   Commonly known as: ZOCOR   Take 0.5 tablets (10 mg total) by mouth every evening.    warfarin 5 MG tablet   Commonly known as: COUMADIN   Take as directed by [facility].    Ct Abdomen Pelvis W Contrast  08/12/2013 CLINICAL DATA: Lower abdominal pain. Previous hysterectomy, appendectomy, cholecystectomy. EXAM: CT ABDOMEN AND PELVIS WITH CONTRAST TECHNIQUE: Multidetector CT imaging of the abdomen and pelvis was performed using the standard protocol following bolus administration of intravenous contrast. CONTRAST: OMNIPAQUE IOHEXOL 300 MG/ML SOLN COMPARISON: CT of the abdomen and pelvis on 06/24/2009 FINDINGS: Lung bases are negative. There is mild intra and extrahepatic biliary ductal dilatation following cholecystectomy, a normal variant. No focal lesions identified within the liver, spleen, pancreas, adrenal glands, or kidneys. Note is made of a hiatal hernia. Otherwise, the stomach has a normal appearance. Small bowel loops are normal in caliber. The appendix is surgically absent. The sigmoid colon is notable for significant mural thickening and inflammatory change. Numerous colonic diverticula are identified within this segment. Findings are consistent with acute diverticulitis. Just superior to the sigmoid segment, there is an irregular collection of fluid and air, measuring 2.1 x 2.0  cm, consistent with early abscess. There are locules of gas adjacent to the sigmoid colon, consistent with micro perforation. There is significant mesenteric fluid. Fluid abuts the left ovary which contains a coarse calcification, unchanged in appearance and measuring 1.9 cm. This could represent a small ovarian dermoid. The right ovary has a normal appearance. The uterus is surgically absent. There are significant degenerative changes throughout the lower thoracic and lumbar spine. No suspicious lytic or blastic lesions are identified. IMPRESSION: 1. Significant changes of sigmoid diverticulitis. 2. Small area of micro perforation and possible early abscess just superior to the sigmoid segment, measuring 2.1 cm in diameter. 3. 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 4. Status post cholecystectomy, hysterectomy. 5. The findings were discussed with Ernest Haber, M.D. on 08/12/2013 at 11:14 PM. 6. I discussed the findings with the patient and her daughter. She will be evaluated in the emergency department. Electronically Signed By: Rosalie Gums M.D. On: 08/12/2013 23:37  Dg Chest Port 1 View  08/13/2013 CLINICAL DATA: Preop. History of hypertension. EXAM: PORTABLE CHEST -  1 VIEW COMPARISON: 06/22/2010 FINDINGS: The heart is enlarged. The lungs are free of focal consolidations and pleural effusions. No pulmonary edema. IMPRESSION: Cardiomegaly. Electronically Signed By: Rosalie Gums M.D. On: 08/13/2013 01:17    Past Medical History  Diagnosis Date  . Hyperlipidemia   . Hypertension   . Hypothyroidism     borderline  . Osteoarthritis of knee     s/p replacement  . Breast mass     left  . Benign head tremor   . Atrial fibrillation 2010    s/p DC cardioversion. on coumadin  . Insomnia   . Family history of anesthesia complication     daughter   nausea  . GERD (gastroesophageal reflux disease)     Current Outpatient Prescriptions on File Prior to Visit  Medication Sig Dispense  Refill  . amLODipine-benazepril (LOTREL) 10-40 MG per capsule Take 1 capsule by mouth daily.  90 capsule  0  . amoxicillin-clavulanate (AUGMENTIN) 875-125 MG per tablet Take 1 tablet by mouth every 12 (twelve) hours.  20 tablet  0  . clonazePAM (KLONOPIN) 0.5 MG tablet TAKE 1 TABLET BY MOUTH EVERY NIGHT AT BEDTIME  30 tablet  3  . flecainide (TAMBOCOR) 100 MG tablet Take 1 tablet (100 mg total) by mouth 2 (two) times daily.  180 tablet  0  . furosemide (LASIX) 40 MG tablet Take 1 tablet (40 mg total) by mouth daily. 2nd dose prn  90 tablet  1  . HYDROcodone-acetaminophen (NORCO/VICODIN) 5-325 MG per tablet Take 1 tablet by mouth every 6 (six) hours as needed for moderate pain or severe pain.  10 tablet  0  . lansoprazole (PREVACID) 15 MG capsule Take 1 capsule (15 mg total) by mouth daily.  90 capsule  0  . levothyroxine (SYNTHROID, LEVOTHROID) 25 MCG tablet Take 1 tablet (25 mcg total) by mouth daily.  90 tablet  1  . metoprolol (LOPRESSOR) 50 MG tablet Take 1 tablet (50 mg total) by mouth 2 (two) times daily.  180 tablet  0  . ondansetron (ZOFRAN) 4 MG tablet Take 1 tablet (4 mg total) by mouth every 6 (six) hours as needed for nausea.  20 tablet  0  . simvastatin (ZOCOR) 20 MG tablet Take 0.5 tablets (10 mg total) by mouth every evening.  45 tablet  0  . warfarin (COUMADIN) 5 MG tablet Take as directed by [facility] Clinic.  90 tablet  0   No current facility-administered medications on file prior to visit.     Review of Systems Per HPI    Objective:   Physical Exam  Nursing note and vitals reviewed. Constitutional: She appears well-developed and well-nourished. No distress.  Cardiovascular: Normal rate, regular rhythm, normal heart sounds and intact distal pulses.   No murmur heard. Pulmonary/Chest: Effort normal. No respiratory distress. She has no wheezes. She has no rales.  Skin:  Ecchymoses throughout body including abdomen, posterior legs, and upper arms Indurated areas on upper  abdomen bilaterally, right side with erythema present Right posterior leg above popliteal fossa with large bruise with central erythema and warmth that extends 20cm in diameter       Assessment & Plan:

## 2013-08-23 NOTE — Assessment & Plan Note (Signed)
Some ecchymoses are to be expected on lovenox bridge and coumadin, and I anticipate these to resolve without concern, including indurated areas of abdomen. However I am worried about the area on right posterior thigh as it is also warm and erythematous.  As it is improving, anticipate just deeper hematoma/ecchymosis than other areas of bruising. Discussed likely reason for bruising and plan of care.  Not consistent with HIT type picture either.  Will closely monitor for now rtc this Friday or Monday next week for recheck, sooner if worsening. Update [place] if worsening.  Red flags to seek urgent care discussed.

## 2013-08-23 NOTE — Progress Notes (Signed)
Pre-visit discussion using our clinic review tool. No additional management support is needed unless otherwise documented below in the visit note.  

## 2013-08-25 NOTE — Addendum Note (Signed)
Addended by: Eustaquio Boyden on: 08/25/2013 07:04 AM   Modules accepted: Level of Service

## 2013-08-27 ENCOUNTER — Inpatient Hospital Stay (HOSPITAL_COMMUNITY)
Admission: EM | Admit: 2013-08-27 | Discharge: 2013-09-02 | DRG: 982 | Disposition: A | Discharge status: Home Health | Payer: Medicare Other | Attending: Internal Medicine | Admitting: Internal Medicine

## 2013-08-27 ENCOUNTER — Ambulatory Visit (INDEPENDENT_AMBULATORY_CARE_PROVIDER_SITE_OTHER): Payer: Medicare Other | Admitting: Family Medicine

## 2013-08-27 ENCOUNTER — Encounter: Payer: Self-pay | Admitting: Family Medicine

## 2013-08-27 ENCOUNTER — Other Ambulatory Visit: Payer: Self-pay | Admitting: Family Medicine

## 2013-08-27 ENCOUNTER — Encounter (HOSPITAL_COMMUNITY): Payer: Self-pay | Admitting: Emergency Medicine

## 2013-08-27 ENCOUNTER — Ambulatory Visit (INDEPENDENT_AMBULATORY_CARE_PROVIDER_SITE_OTHER)
Admission: RE | Admit: 2013-08-27 | Discharge: 2013-08-27 | Disposition: A | Discharge status: Home / Self Care | Payer: Medicare Other | Source: Ambulatory Visit | Attending: Family Medicine | Admitting: Family Medicine

## 2013-08-27 VITALS — BP 126/68 | HR 61 | Temp 98.0°F | Wt 290.5 lb

## 2013-08-27 DIAGNOSIS — K5732 Diverticulitis of large intestine without perforation or abscess without bleeding: Secondary | ICD-10-CM

## 2013-08-27 DIAGNOSIS — R238 Other skin changes: Secondary | ICD-10-CM | POA: Diagnosis present

## 2013-08-27 DIAGNOSIS — G250 Essential tremor: Secondary | ICD-10-CM

## 2013-08-27 DIAGNOSIS — Z881 Allergy status to other antibiotic agents status: Secondary | ICD-10-CM

## 2013-08-27 DIAGNOSIS — I10 Essential (primary) hypertension: Secondary | ICD-10-CM | POA: Diagnosis present

## 2013-08-27 DIAGNOSIS — R112 Nausea with vomiting, unspecified: Secondary | ICD-10-CM | POA: Diagnosis present

## 2013-08-27 DIAGNOSIS — R233 Spontaneous ecchymoses: Secondary | ICD-10-CM

## 2013-08-27 DIAGNOSIS — Z888 Allergy status to other drugs, medicaments and biological substances status: Secondary | ICD-10-CM

## 2013-08-27 DIAGNOSIS — G4730 Sleep apnea, unspecified: Secondary | ICD-10-CM | POA: Diagnosis present

## 2013-08-27 DIAGNOSIS — Z823 Family history of stroke: Secondary | ICD-10-CM

## 2013-08-27 DIAGNOSIS — Z5181 Encounter for therapeutic drug level monitoring: Secondary | ICD-10-CM

## 2013-08-27 DIAGNOSIS — R1031 Right lower quadrant pain: Secondary | ICD-10-CM

## 2013-08-27 DIAGNOSIS — I4891 Unspecified atrial fibrillation: Secondary | ICD-10-CM | POA: Diagnosis present

## 2013-08-27 DIAGNOSIS — N3941 Urge incontinence: Secondary | ICD-10-CM | POA: Diagnosis present

## 2013-08-27 DIAGNOSIS — R259 Unspecified abnormal involuntary movements: Secondary | ICD-10-CM | POA: Diagnosis present

## 2013-08-27 DIAGNOSIS — Z820 Family history of epilepsy and other diseases of the nervous system: Secondary | ICD-10-CM

## 2013-08-27 DIAGNOSIS — E039 Hypothyroidism, unspecified: Secondary | ICD-10-CM | POA: Diagnosis present

## 2013-08-27 DIAGNOSIS — Z96659 Presence of unspecified artificial knee joint: Secondary | ICD-10-CM

## 2013-08-27 DIAGNOSIS — Z7901 Long term (current) use of anticoagulants: Secondary | ICD-10-CM

## 2013-08-27 DIAGNOSIS — Z8249 Family history of ischemic heart disease and other diseases of the circulatory system: Secondary | ICD-10-CM

## 2013-08-27 DIAGNOSIS — E785 Hyperlipidemia, unspecified: Secondary | ICD-10-CM | POA: Diagnosis present

## 2013-08-27 DIAGNOSIS — D689 Coagulation defect, unspecified: Secondary | ICD-10-CM | POA: Diagnosis present

## 2013-08-27 DIAGNOSIS — Z882 Allergy status to sulfonamides status: Secondary | ICD-10-CM

## 2013-08-27 DIAGNOSIS — K219 Gastro-esophageal reflux disease without esophagitis: Secondary | ICD-10-CM | POA: Diagnosis present

## 2013-08-27 DIAGNOSIS — M7981 Nontraumatic hematoma of soft tissue: Principal | ICD-10-CM | POA: Diagnosis present

## 2013-08-27 DIAGNOSIS — S301XXA Contusion of abdominal wall, initial encounter: Secondary | ICD-10-CM

## 2013-08-27 DIAGNOSIS — N1330 Unspecified hydronephrosis: Secondary | ICD-10-CM | POA: Diagnosis not present

## 2013-08-27 DIAGNOSIS — Z6841 Body Mass Index (BMI) 40.0 and over, adult: Secondary | ICD-10-CM

## 2013-08-27 DIAGNOSIS — R791 Abnormal coagulation profile: Secondary | ICD-10-CM | POA: Diagnosis present

## 2013-08-27 DIAGNOSIS — N179 Acute kidney failure, unspecified: Secondary | ICD-10-CM | POA: Diagnosis not present

## 2013-08-27 DIAGNOSIS — D72829 Elevated white blood cell count, unspecified: Secondary | ICD-10-CM

## 2013-08-27 DIAGNOSIS — Z833 Family history of diabetes mellitus: Secondary | ICD-10-CM

## 2013-08-27 DIAGNOSIS — T45515A Adverse effect of anticoagulants, initial encounter: Secondary | ICD-10-CM | POA: Diagnosis present

## 2013-08-27 DIAGNOSIS — Z79899 Other long term (current) drug therapy: Secondary | ICD-10-CM

## 2013-08-27 DIAGNOSIS — D62 Acute posthemorrhagic anemia: Secondary | ICD-10-CM | POA: Diagnosis not present

## 2013-08-27 HISTORY — DX: Sleep apnea, unspecified: G47.30

## 2013-08-27 HISTORY — DX: Personal history of other diseases of the digestive system: Z87.19

## 2013-08-27 HISTORY — DX: Unspecified chronic bronchitis: J42

## 2013-08-27 HISTORY — DX: Contusion of abdominal wall, initial encounter: S30.1XXA

## 2013-08-27 LAB — CBC WITH DIFFERENTIAL/PLATELET
Basophils Absolute: 0.1 10*3/uL (ref 0.0–0.1)
Basophils Relative: 0.6 % (ref 0.0–3.0)
HCT: 34 % — ABNORMAL LOW (ref 36.0–46.0)
Hemoglobin: 11.2 g/dL — ABNORMAL LOW (ref 12.0–15.0)
Lymphocytes Relative: 12.6 % (ref 12.0–46.0)
MCHC: 32.9 g/dL (ref 30.0–36.0)
Monocytes Absolute: 0.7 10*3/uL (ref 0.1–1.0)
Monocytes Relative: 6.7 % (ref 3.0–12.0)
Neutro Abs: 8.2 10*3/uL — ABNORMAL HIGH (ref 1.4–7.7)
Platelets: 396 10*3/uL (ref 150.0–400.0)
RBC: 4.01 Mil/uL (ref 3.87–5.11)

## 2013-08-27 LAB — URINE MICROSCOPIC-ADD ON

## 2013-08-27 LAB — BASIC METABOLIC PANEL
BUN: 17 mg/dL (ref 6–23)
CO2: 26 mEq/L (ref 19–32)
Calcium: 9 mg/dL (ref 8.4–10.5)
Chloride: 101 mEq/L (ref 96–112)
GFR: 84.07 mL/min (ref >60.00)
Potassium: 4.4 mEq/L (ref 3.5–5.1)
Sodium: 136 mEq/L (ref 135–145)

## 2013-08-27 LAB — CBC
HCT: 31 % — ABNORMAL LOW (ref 36.0–46.0)
Hemoglobin: 10 g/dL — ABNORMAL LOW (ref 12.0–15.0)
MCH: 27.9 pg (ref 26.0–34.0)
MCHC: 32.3 g/dL (ref 30.0–36.0)
MCV: 86.4 fL (ref 78.0–100.0)
RBC: 3.59 MIL/uL — ABNORMAL LOW (ref 3.87–5.11)

## 2013-08-27 LAB — POCT INR: INR: 2.4

## 2013-08-27 LAB — URINALYSIS, ROUTINE W REFLEX MICROSCOPIC
Nitrite: NEGATIVE
Protein, ur: 100 mg/dL — AB
Specific Gravity, Urine: 1.027 (ref 1.005–1.030)
Urobilinogen, UA: 1 mg/dL (ref 0.0–1.0)

## 2013-08-27 MED ORDER — ONDANSETRON HCL 4 MG PO TABS
4.0000 mg | ORAL_TABLET | Freq: Four times a day (QID) | ORAL | Status: DC | PRN
  Administered 2013-08-29 – 2013-09-02 (×5): 4 mg via ORAL
  Filled 2013-08-27 (×5): qty 1

## 2013-08-27 MED ORDER — POLYETHYLENE GLYCOL 3350 17 G PO PACK
17.0000 g | PACK | Freq: Every day | ORAL | Status: DC | PRN
  Filled 2013-08-27: qty 1

## 2013-08-27 MED ORDER — SODIUM CHLORIDE 0.9 % IJ SOLN
3.0000 mL | Freq: Two times a day (BID) | INTRAMUSCULAR | Status: DC
  Administered 2013-08-29 – 2013-09-01 (×2): 3 mL via INTRAVENOUS

## 2013-08-27 MED ORDER — SORBITOL 70 % SOLN
30.0000 mL | Freq: Every day | Status: DC | PRN
  Administered 2013-09-02: 08:00:00 30 mL via ORAL
  Filled 2013-08-27: qty 30

## 2013-08-27 MED ORDER — ACETAMINOPHEN 650 MG RE SUPP: 650.0000 mg | Freq: Four times a day (QID) | RECTAL | Status: DC | PRN

## 2013-08-27 MED ORDER — ONDANSETRON HCL 4 MG/2ML IJ SOLN
4.0000 mg | Freq: Four times a day (QID) | INTRAMUSCULAR | Status: DC | PRN
  Administered 2013-08-28 – 2013-08-29 (×3): 4 mg via INTRAVENOUS
  Filled 2013-08-27 (×4): qty 2

## 2013-08-27 MED ORDER — FLECAINIDE ACETATE 100 MG PO TABS
100.0000 mg | ORAL_TABLET | Freq: Two times a day (BID) | ORAL | Status: DC
  Administered 2013-08-27 – 2013-09-02 (×12): 100 mg via ORAL
  Filled 2013-08-27 (×13): qty 1

## 2013-08-27 MED ORDER — SODIUM CHLORIDE 0.9 % IV SOLN
Freq: Once | INTRAVENOUS | Status: AC
  Administered 2013-08-27: 18:00:00 via INTRAVENOUS

## 2013-08-27 MED ORDER — SODIUM CHLORIDE 0.9 % IV SOLN
INTRAVENOUS | Status: AC
  Administered 2013-08-28: 04:00:00 via INTRAVENOUS
  Administered 2013-08-29: 08:00:00 100 mL/h via INTRAVENOUS
  Administered 2013-08-29: via INTRAVENOUS

## 2013-08-27 MED ORDER — DOCUSATE SODIUM 100 MG PO CAPS
100.0000 mg | ORAL_CAPSULE | Freq: Two times a day (BID) | ORAL | Status: DC
  Administered 2013-08-27 – 2013-09-02 (×12): 100 mg via ORAL
  Filled 2013-08-27 (×14): qty 1

## 2013-08-27 MED ORDER — CLONAZEPAM 0.5 MG PO TABS
0.5000 mg | ORAL_TABLET | Freq: Every day | ORAL | Status: DC
  Administered 2013-08-27 – 2013-09-01 (×6): 0.5 mg via ORAL
  Filled 2013-08-27 (×6): qty 1

## 2013-08-27 MED ORDER — ALUM & MAG HYDROXIDE-SIMETH 200-200-20 MG/5ML PO SUSP: 30.0000 mL | Freq: Four times a day (QID) | ORAL | Status: DC | PRN

## 2013-08-27 MED ORDER — SIMVASTATIN 10 MG PO TABS
10.0000 mg | ORAL_TABLET | Freq: Every evening | ORAL | Status: DC
  Administered 2013-08-27 – 2013-09-01 (×6): 10 mg via ORAL
  Filled 2013-08-27 (×7): qty 1

## 2013-08-27 MED ORDER — OXYCODONE HCL 5 MG PO TABS
5.0000 mg | ORAL_TABLET | ORAL | Status: DC | PRN
  Administered 2013-08-28 – 2013-09-02 (×12): 5 mg via ORAL
  Filled 2013-08-27 (×14): qty 1

## 2013-08-27 MED ORDER — PANTOPRAZOLE SODIUM 40 MG PO TBEC
40.0000 mg | DELAYED_RELEASE_TABLET | Freq: Every day | ORAL | Status: DC
  Administered 2013-08-28 – 2013-09-02 (×6): 40 mg via ORAL
  Filled 2013-08-27 (×6): qty 1

## 2013-08-27 MED ORDER — LEVOTHYROXINE SODIUM 25 MCG PO TABS
25.0000 ug | ORAL_TABLET | Freq: Every day | ORAL | Status: DC
  Administered 2013-08-28 – 2013-09-02 (×6): 25 ug via ORAL
  Filled 2013-08-27 (×7): qty 1

## 2013-08-27 MED ORDER — MORPHINE SULFATE 2 MG/ML IJ SOLN
2.0000 mg | INTRAMUSCULAR | Status: DC | PRN
  Administered 2013-08-28 – 2013-08-31 (×2): 2 mg via INTRAVENOUS
  Filled 2013-08-27 (×2): qty 1

## 2013-08-27 MED ORDER — ALBUTEROL SULFATE (5 MG/ML) 0.5% IN NEBU: 2.5000 mg | INHALATION_SOLUTION | RESPIRATORY_TRACT | Status: DC | PRN

## 2013-08-27 MED ORDER — MAGNESIUM CITRATE PO SOLN: 1.0000 | Freq: Once | ORAL | Status: AC | PRN

## 2013-08-27 MED ORDER — METOPROLOL TARTRATE 50 MG PO TABS
50.0000 mg | ORAL_TABLET | Freq: Two times a day (BID) | ORAL | Status: DC
  Administered 2013-08-27 – 2013-09-02 (×11): 50 mg via ORAL
  Filled 2013-08-27 (×13): qty 1

## 2013-08-27 MED ORDER — HYDROMORPHONE HCL PF 1 MG/ML IJ SOLN
1.0000 mg | Freq: Once | INTRAMUSCULAR | Status: AC
  Administered 2013-08-27: 1 mg via INTRAVENOUS
  Filled 2013-08-27: qty 1

## 2013-08-27 MED ORDER — ACETAMINOPHEN 325 MG PO TABS
650.0000 mg | ORAL_TABLET | Freq: Four times a day (QID) | ORAL | Status: DC | PRN
  Administered 2013-08-30 (×2): 650 mg via ORAL
  Filled 2013-08-27 (×2): qty 2

## 2013-08-27 MED ORDER — ONDANSETRON HCL 4 MG/2ML IJ SOLN
4.0000 mg | Freq: Once | INTRAMUSCULAR | Status: AC
  Administered 2013-08-27: 4 mg via INTRAVENOUS
  Filled 2013-08-27: qty 2

## 2013-08-27 NOTE — Progress Notes (Signed)
Pre-visit discussion using our clinic review tool. No additional management support is needed unless otherwise documented below in the visit note.  

## 2013-08-27 NOTE — ED Provider Notes (Signed)
CSN: [id number]     Arrival date & time 08/27/13  1522 History   First MD Initiated Contact with Patient 08/27/13 1543     Chief Complaint  Patient presents with  . Nausea  . Emesis  . Abdominal Pain   (Consider location/radiation/quality/duration/timing/severity/associated sxs/prior Treatment) HPI Comments: Pt was recently in the hospital for diverticulitis, treated well conservatively, restarted on lovenox and then coumadin for atrial fibrillation history, developed sig bruising on arms, abdominal wall.  Seen by PCP today for increasing pain and had CT scan showing a large hematoma.  Pt sent to the ED for further evaluation and treatment especially because she began vomiting following the CT scan for which she received oral contrast, not IV.    Patient is a 72 y.o. female presenting with vomiting and abdominal pain. The history is provided by the patient.  Emesis Severity:  Mild Duration:  5 hours Timing:  Intermittent Chronicity:  New Context: not post-tussive and not self-induced   Relieved by:  Nothing Worsened by:  Nothing tried Associated symptoms: abdominal pain   Associated symptoms: no chills and no fever   Abdominal pain:    Location:  RLQ   Quality:  Shooting and throbbing   Severity:  Severe   Onset quality:  Gradual   Duration:  3 days   Timing:  Constant   Progression:  Worsening   Chronicity:  New Abdominal Pain Associated symptoms: nausea and vomiting   Associated symptoms: no chills and no fever     Past Medical History  Diagnosis Date  . Hyperlipidemia   . Hypertension   . Hypothyroidism     borderline  . Osteoarthritis of knee     s/p replacement  . Breast mass     left  . Benign head tremor   . Atrial fibrillation 2010    s/p DC cardioversion. on coumadin  . Insomnia   . Family history of anesthesia complication     daughter   nausea  . GERD (gastroesophageal reflux disease)    Past Surgical History  Procedure Laterality Date  .  Replacement total knee  2005    bilateral  . Vaginal hysterectomy  1971    partial?  . Appendectomy  1947  . Dilation and curettage of uterus    . Cholecystectomy  1980  . Cardiac catheterization      [facility];Dr. Juliann Pares  . Dexa  10/2012    WNL   Family History  Problem Relation Age of Onset  . CAD Mother     angina  . Cancer Paternal Grandfather     ?  Marland Kitchen Alzheimer's disease Father     died from PNA  . Diabetes Mother   . Stroke Mother 75   History  Substance Use Topics  . Smoking status: Never Smoker   . Smokeless tobacco: Never Used  . Alcohol Use: No   OB History   Grav Para Term Preterm Abortions TAB SAB Ect Mult Living                 Review of Systems  Constitutional: Negative for fever and chills.  Gastrointestinal: Positive for nausea, vomiting and abdominal pain.  Hematological: Bruises/bleeds easily.  All other systems reviewed and are negative.    Allergies  Clindamycin/lincomycin; Codeine; Doxycycline; Paxil; Promethazine; Sulfa antibiotics; and Valium  Home Medications   Current Outpatient Rx  Name  Route  Sig  Dispense  Refill  . amLODipine-benazepril (LOTREL) 10-40 MG per capsule   Oral  Take 1 capsule by mouth daily.   90 capsule   0   . amoxicillin-clavulanate (AUGMENTIN) 875-125 MG per tablet   Oral   Take 1 tablet by mouth every 12 (twelve) hours.   20 tablet   0   . clonazePAM (KLONOPIN) 0.5 MG tablet      TAKE 1 TABLET BY MOUTH EVERY NIGHT AT BEDTIME   30 tablet   3   . flecainide (TAMBOCOR) 100 MG tablet   Oral   Take 1 tablet (100 mg total) by mouth 2 (two) times daily.   180 tablet   0   . furosemide (LASIX) 40 MG tablet   Oral   Take 1 tablet (40 mg total) by mouth daily. 2nd dose prn   90 tablet   1   . lansoprazole (PREVACID) 15 MG capsule   Oral   Take 1 capsule (15 mg total) by mouth daily.   90 capsule   0   . levothyroxine (SYNTHROID, LEVOTHROID) 25 MCG tablet   Oral   Take 1 tablet (25 mcg total) by  mouth daily.   90 tablet   1   . metoprolol (LOPRESSOR) 50 MG tablet   Oral   Take 1 tablet (50 mg total) by mouth 2 (two) times daily.   180 tablet   0   . simvastatin (ZOCOR) 20 MG tablet   Oral   Take 0.5 tablets (10 mg total) by mouth every evening.   45 tablet   0   . warfarin (COUMADIN) 5 MG tablet   Oral   Take 2.5-5 mg by mouth See admin instructions. Take as directed by [facility]in Clinic. Take half of the 5mg  on Mondays Wednesday and Friday and whole tab rest of days          BP 143/60  Pulse 68  Temp(Src) 98.7 F (37.1 C) (Oral)  Resp 22  SpO2 96% Physical Exam  Nursing note and vitals reviewed. Constitutional: She is oriented to person, place, and time. She appears well-developed and well-nourished.  HENT:  Head: Normocephalic and atraumatic.  Eyes: Conjunctivae and EOM are normal. Pupils are equal, round, and reactive to light. No scleral icterus.  Neck: Neck supple.  Cardiovascular: Normal rate, regular rhythm and intact distal pulses.   Pulmonary/Chest: Effort normal.  Abdominal: Soft. She exhibits no distension. There is tenderness. There is guarding. There is no rebound.  Neurological: She is alert and oriented to person, place, and time.  Skin: Skin is warm.    ED Course  Procedures (including critical care time) Labs Review Labs Reviewed - No data to display Imaging Review Ct Abdomen Pelvis Wo Contrast  08/27/2013   CLINICAL DATA:  Mid abdominal right lower quadrant pain, bruising, sigmoid diverticulitis, on anticoagulant therapy  EXAM: CT ABDOMEN AND PELVIS WITHOUT CONTRAST  TECHNIQUE: Multidetector CT imaging of the abdomen and pelvis was performed following the standard protocol without intravenous contrast. Sagittal and coronal MPR images reconstructed from axial data set. Dilute oral contrast was administered for this exam.  COMPARISON:  08/12/2013  FINDINGS: Lung bases clear.  Gallbladder surgically absent.  Moderate-sized hiatal hernia.  Within  limits of a nonenhanced exam no focal abnormalities of the liver, spleen, pancreas, kidneys, or adrenal glands.  Scattered atherosclerotic calcifications.  New right rectus sheath hematoma, 8.6 x 4.4 cm image 59 at least 12.4 cm length.  Multiple subcutaneous nodular opacities are identified likely small subcutaneous hematomas in the anterior abdominal wall bilaterally greater on right, also new.  Stranding of subcutaneous fat at the lateral aspect of the mid to inferior right abdominal wall likely related to above hemorrhages.  Small amount of high attenuation blood seen within the prevesical space likely from leak of the adjacent right rectus sheath hematoma.  No additional intra-abdominal, intra pelvic, or retroperitoneal hemorrhage.  Sigmoid diverticulosis without evidence of diverticulitis.  Bladder contains minimal urine.  Stomach and bowel loops otherwise normal appearance.  Appendix not visualized.  No mass, adenopathy, or free fluid.  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  IMPRESSION: Right rectus sheath hematoma with extension of a small amount of hemorrhage into the prevesical space.  Multiple subcutaneous hematomas.  Moderate-sized hiatal hernia.  Sigmoid diverticulosis with resolution of the sigmoid diverticulitis changes seen on the previous exam.   Electronically Signed   By: Ulyses Southward M.D.   On: 08/27/2013 14:29    EKG Interpretation   None      RA sat is 98% and I interpret to be normal  6:04 PM Pt given pain meds, spoke to Dr. Janee Morn to admit for observation to see if hematoma or anemia is worsening, consideration of reversal of coumadin and pain control   MDM   1. Abdominal wall hematoma, initial encounter   2. Nausea and vomiting in adult   3. Coagulopathy      Pt with tederness, pain that is very limiting her mobility, unable to sit up, ambulate easily due to pain.  I reviewed CT and labs in [facility] from earlier today. INR was 2.64.  Will  discuss with hospitalist regarding admission for pain and monitoring hematoma and coagulopathy.      Gavin Pound. Oletta Lamas, MD 08/27/13 [id number]

## 2013-08-27 NOTE — Progress Notes (Signed)
   Subjective:    Patient ID: Mckenzie Frank, female    DOB: 08/23/1941, 72 y.o.   MRN: [id number]  HPI CC: abd pain  Seen here 08/23/2013 after recent hospital admission 12/11-16/2014 for sigmoid diverticulitis with concern for abscess formation and microperforation.  sxs improved with bowel rest and IV antibiotic.  She was sent home to complete 2 wks of antibiotics with oral augmentin.    She has history of atrial fibrillation and coumadin was held during hospitalization, then was restarted upon discharge with lovenox bridging.  On Monday of this week, INR was subtherapeutic at 1.3 so lovenox was continued.  Today's INR is 2.4 so lovenox will be discontinued.  She had large hematoma forrmation of bilateral posterior thighs and arms as well as on abdominal wall.  Now over the last 1 day started having sharp stabbing RLQ pain.  Worse pain with position changes.  Points to abdominal wall at right lower pannus as site of pain.  She also has had pain at L AC fossa. No fevers/chills, nausea/vomiting, diarrhea or constipation.  Past Medical History  Diagnosis Date  . Hyperlipidemia   . Hypertension   . Hypothyroidism     borderline  . Osteoarthritis of knee     s/p replacement  . Breast mass     left  . Benign head tremor   . Atrial fibrillation 2010    s/p DC cardioversion. on coumadin  . Insomnia   . Family history of anesthesia complication     daughter   nausea  . GERD (gastroesophageal reflux disease)     Past Surgical History  Procedure Laterality Date  . Replacement total knee  2005    bilateral  . Vaginal hysterectomy  1971    partial?  . Appendectomy  1947  . Dilation and curettage of uterus    . Cholecystectomy  1980  . Cardiac catheterization      [facility];Dr. Juliann Pares  . Dexa  10/2012    WNL   Review of Systems Per HPI    Objective:   Physical Exam  Nursing note and vitals reviewed. Constitutional: She appears well-developed and well-nourished. No distress.    Abdominal: Soft. Bowel sounds are normal. She exhibits no distension and no mass. There is no hepatosplenomegaly. There is tenderness in the right lower quadrant. There is guarding. There is no rigidity, no rebound, no CVA tenderness and negative Murphy's sign.    Morbid obesity  large tender area of induration RLQ  Skin: Skin is warm. Bruising and ecchymosis noted.     Most prior ecchymoses healing well. Large L AC fossa ecchymosis that is tender Right lower abdominal wall with large area of induration, palpation reproduces pain experienced, no surrounding erythema or warmth       Assessment & Plan:

## 2013-08-27 NOTE — Assessment & Plan Note (Signed)
Describes 1-2 d h/o worsening RLQ pain but describes mainly abdominal wall pain, doubt bowel etiology. In recent sigmoid diverticulitis, will obtain CT scan today with contrast to eval for panniculitis/abscess, hematoma. Now off abx. Check CBC, CMP today. Pt agrees with plan.

## 2013-08-27 NOTE — Progress Notes (Addendum)
I was informed that the pt will be admitted to 5w09 at 1750, called to receive report from ED at 1755 and was placed on hold until 1604. Called ED again at 1605 and spoke with ED RN who is to call back once she has given report on another pt. ED RN called and gave report at 1610.

## 2013-08-27 NOTE — H&P (Signed)
Mckenzie Frank History and Physical  JAIDYNN Frank LKG:[id number] DOB: 02-17-1941 DOA: 08/27/2013  Referring physician: Dr. Oletta Lamas PCP: Eustaquio Boyden, MD   Chief Complaint: Abdominal pain  HPI: Mckenzie Frank is a 72 y.o. 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 Patient was recently discharged from the hospital on 08/17/2013 after being treated for an acute sigmoid diverticulitis with possible microperforation and abscess formation. Patient had presented to her PCPs office for hospital followup and administer course of oral antibiotics. Patient was complaining of right lower quadrant sharp stabbing pain which  started 2 days prior to admission and had gotten to the point she was unable to sit up straight. Patient also with complaints of upper left upper extremity bruising. CT scan was ordered and after drinking the oral contrast patient had episodes of nausea and emesis as well as some diarrhea. Patient denies any fever, no chills, no chest pain, no shortness of breath, no cough, no constipation, no dysuria, no weakness. Patient was seen in the ED and CT of the abdomen and pelvis obtained showed a new right rectus sheath hematoma with extension of a small amount of hemorrhage into the prevesical space, multiple subcutaneous hematomas, moderate sized hiatal hernia, sigmoid diverticulosis with resolution of sigmoid diverticulitis. Labs are tender patient's PCPs office had a normal basic metabolic profile. CBC had a hemoglobin of 11.2 otherwise was within normal limits. INR obtained was 2.4. Patient had been on a Lovenox bridge as well as Coumadin since her prior discharge. We were called to  admit the patient for further evaluation and management.   Review of Systems:  Constitutional:  No weight loss, night sweats, Fevers, chills, fatigue.  HEENT:  No headaches, Difficulty swallowing,Tooth/dental problems,Sore throat,  No sneezing, itching, ear ache, nasal congestion, post nasal drip,  Cardio-vascular:  No chest pain, Orthopnea, PND, swelling in lower extremities, anasarca, dizziness, palpitations  GI:  No heartburn, indigestion, abdominal pain, nausea, vomiting, diarrhea, change in bowel habits, loss of appetite  Resp:  No shortness of breath with exertion or at rest. No excess mucus, no productive cough, No non-productive cough, No coughing up of blood.No change in color of mucus.No wheezing.No chest wall deformity  Skin:  no rash or lesions.  GU:  no dysuria, change in color of urine, no urgency or frequency. No flank pain.  Musculoskeletal:  No joint pain or swelling. No decreased range of motion. No back pain.  Psych:  No change in mood or affect. No depression or anxiety. No memory loss.   Past Medical History  Diagnosis Date  . Hyperlipidemia   . Hypertension   . Hypothyroidism     borderline  . Osteoarthritis of knee     s/p replacement  . Breast mass     left  . Benign head tremor   . Atrial fibrillation 2010    s/p DC cardioversion. on coumadin  . Insomnia   . Family history of anesthesia complication     daughter   nausea  . GERD (gastroesophageal reflux disease)    Past Surgical History  Procedure Laterality Date  . Replacement total knee  2005    bilateral  . Vaginal hysterectomy  1971    partial?  . Appendectomy  1947  . Dilation and curettage of uterus    .  Cholecystectomy  1980  . Cardiac catheterization      [facility];Dr. Juliann Pares  . Dexa  10/2012    WNL   Social History:  reports that she has never smoked. She has never used smokeless tobacco. She reports that she does not drink alcohol or use illicit drugs.  Allergies  Allergen  Reactions  . Clindamycin/Lincomycin     unknown  . Codeine     unknown  . Doxycycline     unknown  . Paxil [Paroxetine Hcl]     Shaking   . Promethazine     unknown  . Sulfa Antibiotics     Hives   . Valium     unknown    Family History  Problem Relation Age of Onset  . CAD Mother     angina  . Cancer Paternal Grandfather     ?  Marland Kitchen Alzheimer's disease Father     died from PNA  . Diabetes Mother   . Stroke Mother 28     Prior to Admission medications   Medication Sig Start Date End Date Taking? Authorizing Provider  amLODipine-benazepril (LOTREL) 10-40 MG per capsule Take 1 capsule by mouth daily. 08/06/13  Yes Eustaquio Boyden, MD  amoxicillin-clavulanate (AUGMENTIN) 875-125 MG per tablet Take 1 tablet by mouth every 12 (twelve) hours. 08/17/13  Yes Joseph Art, DO  clonazePAM (KLONOPIN) 0.5 MG tablet TAKE 1 TABLET BY MOUTH EVERY NIGHT AT BEDTIME 07/23/13  Yes Eustaquio Boyden, MD  flecainide (TAMBOCOR) 100 MG tablet Take 1 tablet (100 mg total) by mouth 2 (two) times daily. 08/06/13  Yes Eustaquio Boyden, MD  furosemide (LASIX) 40 MG tablet Take 1 tablet (40 mg total) by mouth daily. 2nd dose prn 08/06/13  Yes Eustaquio Boyden, MD  lansoprazole (PREVACID) 15 MG capsule Take 1 capsule (15 mg total) by mouth daily. 08/06/13  Yes Eustaquio Boyden, MD  levothyroxine (SYNTHROID, LEVOTHROID) 25 MCG tablet Take 1 tablet (25 mcg total) by mouth daily. 05/27/13  Yes Eustaquio Boyden, MD  metoprolol (LOPRESSOR) 50 MG tablet Take 1 tablet (50 mg total) by mouth 2 (two) times daily. 08/06/13  Yes Eustaquio Boyden, MD  simvastatin (ZOCOR) 20 MG tablet Take 0.5 tablets (10 mg total) by mouth every evening. 08/06/13  Yes Eustaquio Boyden, MD  warfarin (COUMADIN) 5 MG tablet Take 2.5-5 mg by mouth See admin instructions. Take as directed by [facility]. Take half of the 5mg  on Mondays Wednesday and Friday and whole tab rest of days 08/06/13  Yes Eustaquio Boyden, MD   Physical Exam: Filed  Vitals:   08/27/13 1830  BP: 114/43  Pulse: 70  Temp:   Resp: 21    BP 114/43  Pulse 70  Temp(Src) 98.7 F (37.1 C) (Oral)  Resp 21  SpO2 97%  General:  Appears calm and comfortable Eyes: PERRL, normal lids, irises & conjunctiva ENT: grossly normal hearing, lips & tongue Neck: no LAD, masses or thyromegaly Cardiovascular: RRR, no m/r/g. No LE edema. Telemetry: Irregularly irregular Respiratory: CTA bilaterally, no w/r/r. Normal respiratory effort. Abdomen: soft, positive bowel sounds, tenderness to palpation in the lower abdomen, areas of induration in the right lower quadrant mid lower quadrant and left lower quadrant, bruising on the lower abdominal wall. Skin: Bruising noted on the lower abdominal wall and bilateral posterior thighs as well as arms and antecubital area bilaterally. Musculoskeletal: grossly normal tone BUE/BLE Psychiatric: grossly normal mood and affect, speech fluent and appropriate Neurologic: Alert and oriented x3. Cranial nerves II through XII are  grossly intact. No focal deficits. Sensation is intact. Visual fields are intact. Gait not tested secondary to safety.           Labs on Admission:  Basic Metabolic Panel:  Recent Labs Lab 08/27/13 1036  NA 136  K 4.4  CL 101  CO2 26  GLUCOSE 96  BUN 17  CREATININE 0.7  CALCIUM 9.0   Liver Function Tests: No results found for this basename: AST, ALT, ALKPHOS, BILITOT, PROT, ALBUMIN,  in the last 168 hours No results found for this basename: LIPASE, AMYLASE,  in the last 168 hours No results found for this basename: AMMONIA,  in the last 168 hours CBC:  Recent Labs Lab 08/27/13 1036  WBC 10.3  NEUTROABS 8.2*  HGB 11.2*  HCT 34.0*  MCV 84.7  PLT 396.0   Cardiac Enzymes: No results found for this basename: CKTOTAL, CKMB, CKMBINDEX, TROPONINI,  in the last 168 hours  BNP (last 3 results) No results found for this basename: PROBNP,  in the last 8760 hours CBG: No results found for this  basename: GLUCAP,  in the last 168 hours  Radiological Exams on Admission: Ct Abdomen Pelvis Wo Contrast  08/27/2013   CLINICAL DATA:  Mid abdominal right lower quadrant pain, bruising, sigmoid diverticulitis, on anticoagulant therapy  EXAM: CT ABDOMEN AND PELVIS WITHOUT CONTRAST  TECHNIQUE: Multidetector CT imaging of the abdomen and pelvis was performed following the standard protocol without intravenous contrast. Sagittal and coronal MPR images reconstructed from axial data set. Dilute oral contrast was administered for this exam.  COMPARISON:  08/12/2013  FINDINGS: Lung bases clear.  Gallbladder surgically absent.  Moderate-sized hiatal hernia.  Within limits of a nonenhanced exam no focal abnormalities of the liver, spleen, pancreas, kidneys, or adrenal glands.  Scattered atherosclerotic calcifications.  New right rectus sheath hematoma, 8.6 x 4.4 cm image 59 at least 12.4 cm length.  Multiple subcutaneous nodular opacities are identified likely small subcutaneous hematomas in the anterior abdominal wall bilaterally greater on right, also new.  Stranding of subcutaneous fat at the lateral aspect of the mid to inferior right abdominal wall likely related to above hemorrhages.  Small amount of high attenuation blood seen within the prevesical space likely from leak of the adjacent right rectus sheath hematoma.  No additional intra-abdominal, intra pelvic, or retroperitoneal hemorrhage.  Sigmoid diverticulosis without evidence of diverticulitis.  Bladder contains minimal urine.  Stomach and bowel loops otherwise normal appearance.  Appendix not visualized.  No mass, adenopathy, or free fluid.  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  IMPRESSION: Right rectus sheath hematoma with extension of a small amount of hemorrhage into the prevesical space.  Multiple subcutaneous hematomas.  Moderate-sized hiatal hernia.  Sigmoid diverticulosis with resolution of the sigmoid  diverticulitis changes seen on the previous exam.   Electronically Signed   By: Ulyses Southward M.D.   On: 08/27/2013 14:29    EKG: Not done  Assessment/Plan Principal Problem:   Abdominal wall hematoma Active Problems:   Atrial fibrillation   Hyperlipidemia   Hypertension   Hypothyroidism   Benign head tremor   Insomnia   Abnormal bruising   RLQ abdominal pain   Coagulopathy   Nausea and vomiting in adult  #1 abdominal wall hematoma/multiple subcutaneous hematomas Per CT of the abdomen and pelvis. Likely secondary to anticoagulation as patient was on Coumadin with a Lovenox bridge. Patient is hemodynamically stable. CBC obtained that PCPs office has a hemoglobin of 11.2. Will hold Coumadin  and Lovenox for now. We'll check serial CBCs. Warm compresses. IV fluids. Pain management. Supportive care. If patient becomes hemodynamically unstable and a significant drop in her hemoglobin will likely need to give some FFP and reverse patient's INR and also repeat imaging on the abdomen and pelvis and consult with general surgery for further evaluation and recommendations.  #2 atrial fibrillation Continue metoprolol and flecainide for rate control. Anticoagulation on hold secondary to problem #1.  #3 hypertension Lopressor metoprolol for now. Will hold patient's Norvasc and benazepril. Her blood pressure becomes elevated they resume patient's Norvasc and benazepril. I F. #4 hypothyroidism Continue Synthroid.  #4 hyperlipidemia Continue Zocor.  #5 recently treated sigmoid diverticulitis Patient has been fitted with an adequate course of antibiotics. CT of the abdomen and pelvis showed resolution of patient's diverticulitis. Patient need to followup with Gen. surgery as outpatient as previously scheduled.  #6 benign tremors Stable. Patient on beta blocker. Follow.  #7 prophylaxis PPI for GI prophylaxis. SCDs for DVT prophylaxis.   Code Status: Full Family Communication: UpDATED patient  and children at bedside. Disposition Plan: Admit to telemetry  Time spent: 24 MINS  [facility] MD Mckenzie Frank Pager [phone]

## 2013-08-27 NOTE — ED Notes (Signed)
Pt reports to the ED via [facility] for eval of N/V and abdominal pain. Pt was being seen at the [facility] for a Coumadin check and was sent here for a CT scan. Pt left the hospital on Monday following a week long admission for her diverticulosis. Pt reporting RLQ abdominal pain x 3 days. Pt also reports N/V that began today following drinking contrast for a CT scan. Pt denies any hematemesis. Pt denies any fever, chills, CP, SOB, or diarrhea. Pt has had an appendectomy, cholecystectomy, and a hysterectomy. Pt A&O x4, resp e/u, and skin warm and dry. VSS en route.

## 2013-08-27 NOTE — Patient Instructions (Signed)
I'd like to obtain CT of abdomen - pass by Marion's office to schedule this. blood work today.

## 2013-08-28 DIAGNOSIS — D689 Coagulation defect, unspecified: Secondary | ICD-10-CM

## 2013-08-28 DIAGNOSIS — D72829 Elevated white blood cell count, unspecified: Secondary | ICD-10-CM

## 2013-08-28 DIAGNOSIS — R238 Other skin changes: Secondary | ICD-10-CM

## 2013-08-28 LAB — BASIC METABOLIC PANEL
BUN: 23 mg/dL (ref 6–23)
Calcium: 7.9 mg/dL — ABNORMAL LOW (ref 8.4–10.5)
Chloride: 99 mEq/L (ref 96–112)
GFR calc Af Amer: 54 mL/min — ABNORMAL LOW (ref >90)
GFR calc non Af Amer: 47 mL/min — ABNORMAL LOW (ref >90)
Glucose, Bld: 125 mg/dL — ABNORMAL HIGH (ref 70–99)
Potassium: 4.6 mEq/L (ref 3.5–5.1)
Sodium: 133 mEq/L — ABNORMAL LOW (ref 135–145)

## 2013-08-28 LAB — PROTIME-INR
INR: 2.38 — ABNORMAL HIGH (ref 0.00–1.49)
Prothrombin Time: 25.2 seconds — ABNORMAL HIGH (ref 11.6–15.2)

## 2013-08-28 LAB — CBC
Hemoglobin: 8.6 g/dL — ABNORMAL LOW (ref 12.0–15.0)
MCH: 28 pg (ref 26.0–34.0)
MCHC: 32.5 g/dL (ref 30.0–36.0)
Platelets: 427 10*3/uL — ABNORMAL HIGH (ref 150–400)
RBC: 3.07 MIL/uL — ABNORMAL LOW (ref 3.87–5.11)
WBC: 12.6 10*3/uL — ABNORMAL HIGH (ref 4.0–10.5)

## 2013-08-28 MED ORDER — PHYTONADIONE 5 MG PO TABS
10.0000 mg | ORAL_TABLET | Freq: Once | ORAL | Status: AC
  Administered 2013-08-28: 14:00:00 10 mg via ORAL
  Filled 2013-08-28: qty 2

## 2013-08-28 NOTE — Progress Notes (Addendum)
PT Cancellation Note  Patient Details Name: Mckenzie Frank MRN: [id number] DOB: 03-06-1941   Cancelled Treatment:    Reason Eval/Treat Not Completed: Medical issues which prohibited therapy (pt nauseated, just vomitted on her way back from bathroom).  PT to check back later this PM or tomorrow.   08/28/2013 1425: checked back with pt and family reported she just got a very strong pain medicine and is sleeping soundly.  Family would prefer if PT check back tomorrow.    Thanks,  Rollene Rotunda. Orval Dortch, PT, DPT [phone]   08/28/2013, 11:32 AM

## 2013-08-28 NOTE — Progress Notes (Signed)
[facility] PROGRESS NOTE  Mckenzie Frank ZOX:[id number] DOB: 1941/04/04 DOA: 08/27/2013 PCP: Eustaquio Boyden, MD  HPI/Subjective: Complaining about abdominal pain which is controlled with pain medications. Overall better than yesterday.  Assessment/Plan: Principal Problem:   Abdominal wall hematoma Active Problems:   Atrial fibrillation   Hyperlipidemia   Hypertension   Hypothyroidism   Benign head tremor   Insomnia   Abnormal bruising   RLQ abdominal pain   Coagulopathy   Nausea and vomiting in adult   Abdominal wall hematoma -Rectus sheath hematoma with multiple small subcutaneous hematomas in the anterior abdominal wall. -This is likely secondary to anticoagulation. -Patient was on Coumadin atenolol respiration, INR is 2.3, I will give vitamin K. -She will need to be off of anticoagulation for at least 2 weeks. -Watch for now, if getting worse she might need surgical consultation.  Anemia -Acute blood loss anemia secondary to intramuscular bleeding. -Watch hemoglobin closely, might drop further with hydration. -I will probably transfuse if hemoglobin dropped to 7.5.  Atrial fibrillation -Patient is on metoprolol and flecainide for rate control. -Anticoagulation is on hold secondary to abdominal wall hematoma.  Hypertension -On metoprolol, Norvasc and aspirin on hold, restart as appropriate.  Recently treated sigmoid diverticulitis -CT scan of abdomen pelvis showed resolution of patient diverticulitis.  Code Status: Full code Family Communication: Plan discussed with the patient. Disposition Plan: Remains inpatient   Consultants:  None  Procedures:  None  Antibiotics:  None   Objective: Filed Vitals:   08/28/13 1048  BP: 142/78  Pulse:   Temp:   Resp:     Intake/Output Summary (Last 24 hours) at 08/28/13 1103 Last data filed at 08/28/13 0200  Gross per 24 hour  Intake      0 ml  Output    190 ml  Net   -190 ml   Filed Weights    08/27/13 1938 08/28/13 0500  Weight: 119.8 kg (264 lb 1.8 oz) 119.8 kg (264 lb 1.8 oz)    Exam: General: Alert and awake, oriented x3, not in any acute distress. HEENT: anicteric sclera, pupils reactive to light and accommodation, EOMI CVS: S1-S2 clear, no murmur rubs or gallops Chest: clear to auscultation bilaterally, no wheezing, rales or rhonchi Abdomen: soft nontender, nondistended, normal bowel sounds, no organomegaly Extremities: no cyanosis, clubbing or edema noted bilaterally Neuro: Cranial nerves II-XII intact, no focal neurological deficits  Data Reviewed: Basic Metabolic Panel:  Recent Labs Lab 08/27/13 1036 08/27/13 2042 08/28/13 0540  NA 136  --  133*  K 4.4  --  4.6  CL 101  --  99  CO2 26  --  22  GLUCOSE 96  --  125*  BUN 17  --  23  CREATININE 0.7  --  1.13*  CALCIUM 9.0  --  7.9*  MG  --  2.1  --    Liver Function Tests: No results found for this basename: AST, ALT, ALKPHOS, BILITOT, PROT, ALBUMIN,  in the last 168 hours No results found for this basename: LIPASE, AMYLASE,  in the last 168 hours No results found for this basename: AMMONIA,  in the last 168 hours CBC:  Recent Labs Lab 08/27/13 1036 08/27/13 2042 08/28/13 0540  WBC 10.3 12.7* 12.6*  NEUTROABS 8.2*  --   --   HGB 11.2* 10.0* 8.6*  HCT 34.0* 31.0* 26.5*  MCV 84.7 86.4 86.3  PLT 396.0 408* 427*   Cardiac Enzymes: No results found for this basename: CKTOTAL, CKMB, CKMBINDEX, TROPONINI,  in the last 168 hours BNP (last 3 results) No results found for this basename: PROBNP,  in the last 8760 hours CBG: No results found for this basename: GLUCAP,  in the last 168 hours  Micro No results found for this or any previous visit (from the past 240 hour(s)).   Studies: Ct Abdomen Pelvis Wo Contrast  08/27/2013   CLINICAL DATA:  Mid abdominal right lower quadrant pain, bruising, sigmoid diverticulitis, on anticoagulant therapy  EXAM: CT ABDOMEN AND PELVIS WITHOUT CONTRAST  TECHNIQUE:  Multidetector CT imaging of the abdomen and pelvis was performed following the standard protocol without intravenous contrast. Sagittal and coronal MPR images reconstructed from axial data set. Dilute oral contrast was administered for this exam.  COMPARISON:  08/12/2013  FINDINGS: Lung bases clear.  Gallbladder surgically absent.  Moderate-sized hiatal hernia.  Within limits of a nonenhanced exam no focal abnormalities of the liver, spleen, pancreas, kidneys, or adrenal glands.  Scattered atherosclerotic calcifications.  New right rectus sheath hematoma, 8.6 x 4.4 cm image 59 at least 12.4 cm length.  Multiple subcutaneous nodular opacities are identified likely small subcutaneous hematomas in the anterior abdominal wall bilaterally greater on right, also new.  Stranding of subcutaneous fat at the lateral aspect of the mid to inferior right abdominal wall likely related to above hemorrhages.  Small amount of high attenuation blood seen within the prevesical space likely from leak of the adjacent right rectus sheath hematoma.  No additional intra-abdominal, intra pelvic, or retroperitoneal hemorrhage.  Sigmoid diverticulosis without evidence of diverticulitis.  Bladder contains minimal urine.  Stomach and bowel loops otherwise normal appearance.  Appendix not visualized.  No mass, adenopathy, or free fluid.  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  IMPRESSION: Right rectus sheath hematoma with extension of a small amount of hemorrhage into the prevesical space.  Multiple subcutaneous hematomas.  Moderate-sized hiatal hernia.  Sigmoid diverticulosis with resolution of the sigmoid diverticulitis changes seen on the previous exam.   Electronically Signed   By: Ulyses Southward M.D.   On: 08/27/2013 14:29    Scheduled Meds: . clonazePAM  0.5 mg Oral QHS  . docusate sodium  100 mg Oral BID  . flecainide  100 mg Oral BID  . levothyroxine  25 mcg Oral QAC breakfast  . metoprolol  50 mg  Oral BID  . pantoprazole  40 mg Oral Q0600  . simvastatin  10 mg Oral QPM  . sodium chloride  3 mL Intravenous Q12H   Continuous Infusions: . sodium chloride 100 mL/hr at 08/28/13 0354       Time spent: 35 minutes    [facility]  [facility] Pager [phone] If 7PM-7AM, please contact night-coverage at [email or web address], password [facility] 08/28/2013, 11:03 AM  LOS: 1 day

## 2013-08-28 NOTE — Progress Notes (Signed)
Notified Lynch, NP that patient is having some blood in her urine, pt states this is new for her. Lynch, NP ordered a PT INR in am. No other orders given. Will continue to monitor patient. Nelda Marseille, RN

## 2013-08-29 ENCOUNTER — Inpatient Hospital Stay (HOSPITAL_COMMUNITY): Payer: Medicare Other

## 2013-08-29 DIAGNOSIS — N179 Acute kidney failure, unspecified: Secondary | ICD-10-CM

## 2013-08-29 LAB — URINE CULTURE: Colony Count: NO GROWTH

## 2013-08-29 LAB — URINALYSIS, ROUTINE W REFLEX MICROSCOPIC
Ketones, ur: 15 mg/dL — AB
Nitrite: NEGATIVE
Protein, ur: >300 mg/dL — AB
pH: 5.5 (ref 5.0–8.0)

## 2013-08-29 LAB — CBC
Hemoglobin: 7.3 g/dL — ABNORMAL LOW (ref 12.0–15.0)
MCH: 28.3 pg (ref 26.0–34.0)
MCHC: 33 g/dL (ref 30.0–36.0)
MCV: 85.7 fL (ref 78.0–100.0)
Platelets: 445 10*3/uL — ABNORMAL HIGH (ref 150–400)
RBC: 2.58 MIL/uL — ABNORMAL LOW (ref 3.87–5.11)
RDW: 15.9 % — ABNORMAL HIGH (ref 11.5–15.5)

## 2013-08-29 LAB — URINE MICROSCOPIC-ADD ON

## 2013-08-29 LAB — PROTIME-INR: INR: 1.63 — ABNORMAL HIGH (ref 0.00–1.49)

## 2013-08-29 LAB — BASIC METABOLIC PANEL
BUN: 38 mg/dL — ABNORMAL HIGH (ref 6–23)
CO2: 18 mEq/L — ABNORMAL LOW (ref 19–32)
Calcium: 7.9 mg/dL — ABNORMAL LOW (ref 8.4–10.5)
Glucose, Bld: 122 mg/dL — ABNORMAL HIGH (ref 70–99)
Potassium: 4.6 mEq/L (ref 3.5–5.1)
Sodium: 131 mEq/L — ABNORMAL LOW (ref 135–145)

## 2013-08-29 MED ORDER — SODIUM CHLORIDE 0.9 % IV BOLUS (SEPSIS)
250.0000 mL | Freq: Once | INTRAVENOUS | Status: AC
  Administered 2013-08-29: 06:00:00 250 mL via INTRAVENOUS

## 2013-08-29 NOTE — Progress Notes (Signed)
Notified Lynch, NP that patient is still having blood in urine and patient only able to void a few drops of urine at a time. Bladder scanner results was 55cc. Lynch, NP gave order to give a 250cc NS bolus. Will continue to monitor patient. Nelda Marseille, RN

## 2013-08-29 NOTE — Progress Notes (Signed)
[facility] PROGRESS NOTE  Mckenzie Frank AVW:[id number] DOB: July 21, 1941 DOA: 08/27/2013 PCP: Eustaquio Boyden, MD  HPI/Subjective: Complaining about some abdominal pain, nausea and vomiting as well likely secondary to narcotics. 1 or 2 drops of blood in her urine the.  Assessment/Plan: Principal Problem:   Abdominal wall hematoma Active Problems:   Atrial fibrillation   Hyperlipidemia   Hypertension   Hypothyroidism   Abnormal bruising   Coagulopathy   Nausea and vomiting in adult   ARF (acute renal failure)   Abdominal wall hematoma -Rectus sheath hematoma with multiple small subcutaneous hematomas in the anterior abdominal wall. -This is likely secondary to anticoagulation. -Patient was on Coumadin atenolol respiration, INR is 2.3, I will give vitamin K. -She will need to be off of anticoagulation for at least 2 weeks. -Watch for now, if getting worse she might need surgical consultation.  Anemia -Acute blood loss anemia secondary to intramuscular bleeding. -Watch hemoglobin closely, might drop further with hydration. -Hemoglobin dropped to 7.3, likely secondary to the abdominal wall hematoma, transfuse 2 units of packed RBCs.  Acute renal failure -Baseline creatinine is 0.7, creatinine increased to 2.0. -Had low blood pressure of 94/44 on the 26th, also had a contrasted CT scan. -Check urinalysis, renal ultrasound, continue IV fluids.  Atrial fibrillation -Patient is on metoprolol and flecainide for rate control. -Anticoagulation is on hold secondary to abdominal wall hematoma.  Hypertension -On metoprolol, Norvasc and aspirin on hold, restart as appropriate.  Recently treated sigmoid diverticulitis -CT scan of abdomen pelvis showed resolution of patient diverticulitis.  Code Status: Full code Family Communication: Plan discussed with the patient. Disposition Plan: Remains  inpatient   Consultants:  None  Procedures:  None  Antibiotics:  None   Objective: Filed Vitals:   08/29/13 0539  BP: 134/72  Pulse: 62  Temp: 97.5 F (36.4 C)  Resp: 18    Intake/Output Summary (Last 24 hours) at 08/29/13 1017 Last data filed at 08/29/13 0815  Gross per 24 hour  Intake 1381.67 ml  Output     50 ml  Net 1331.67 ml   Filed Weights   08/27/13 1938 08/28/13 0500 08/29/13 0539  Weight: 119.8 kg (264 lb 1.8 oz) 119.8 kg (264 lb 1.8 oz) 118.298 kg (260 lb 12.8 oz)    Exam: General: Alert and awake, oriented x3, not in any acute distress. HEENT: anicteric sclera, pupils reactive to light and accommodation, EOMI CVS: S1-S2 clear, no murmur rubs or gallops Chest: clear to auscultation bilaterally, no wheezing, rales or rhonchi Abdomen: soft nontender, nondistended, normal bowel sounds, no organomegaly Extremities: no cyanosis, clubbing or edema noted bilaterally Neuro: Cranial nerves II-XII intact, no focal neurological deficits  Data Reviewed: Basic Metabolic Panel:  Recent Labs Lab 08/27/13 1036 08/27/13 2042 08/28/13 0540 08/29/13 0745  NA 136  --  133* 131*  K 4.4  --  4.6 4.6  CL 101  --  99 99  CO2 26  --  22 18*  GLUCOSE 96  --  125* 122*  BUN 17  --  23 38*  CREATININE 0.7  --  1.13* 2.02*  CALCIUM 9.0  --  7.9* 7.9*  MG  --  2.1  --   --    Liver Function Tests: No results found for this basename: AST, ALT, ALKPHOS, BILITOT, PROT, ALBUMIN,  in the last 168 hours No results found for this basename: LIPASE, AMYLASE,  in the last 168 hours No results found for this basename: AMMONIA,  in the  last 168 hours CBC:  Recent Labs Lab 08/27/13 1036 08/27/13 2042 08/28/13 0540 08/29/13 0745  WBC 10.3 12.7* 12.6* 15.2*  NEUTROABS 8.2*  --   --   --   HGB 11.2* 10.0* 8.6* 7.3*  HCT 34.0* 31.0* 26.5* 22.1*  MCV 84.7 86.4 86.3 85.7  PLT 396.0 408* 427* 445*   Cardiac Enzymes: No results found for this basename: CKTOTAL, CKMB,  CKMBINDEX, TROPONINI,  in the last 168 hours BNP (last 3 results) No results found for this basename: PROBNP,  in the last 8760 hours CBG: No results found for this basename: GLUCAP,  in the last 168 hours  Micro No results found for this or any previous visit (from the past 240 hour(s)).   Studies: Ct Abdomen Pelvis Wo Contrast  08/27/2013   CLINICAL DATA:  Mid abdominal right lower quadrant pain, bruising, sigmoid diverticulitis, on anticoagulant therapy  EXAM: CT ABDOMEN AND PELVIS WITHOUT CONTRAST  TECHNIQUE: Multidetector CT imaging of the abdomen and pelvis was performed following the standard protocol without intravenous contrast. Sagittal and coronal MPR images reconstructed from axial data set. Dilute oral contrast was administered for this exam.  COMPARISON:  08/12/2013  FINDINGS: Lung bases clear.  Gallbladder surgically absent.  Moderate-sized hiatal hernia.  Within limits of a nonenhanced exam no focal abnormalities of the liver, spleen, pancreas, kidneys, or adrenal glands.  Scattered atherosclerotic calcifications.  New right rectus sheath hematoma, 8.6 x 4.4 cm image 59 at least 12.4 cm length.  Multiple subcutaneous nodular opacities are identified likely small subcutaneous hematomas in the anterior abdominal wall bilaterally greater on right, also new.  Stranding of subcutaneous fat at the lateral aspect of the mid to inferior right abdominal wall likely related to above hemorrhages.  Small amount of high attenuation blood seen within the prevesical space likely from leak of the adjacent right rectus sheath hematoma.  No additional intra-abdominal, intra pelvic, or retroperitoneal hemorrhage.  Sigmoid diverticulosis without evidence of diverticulitis.  Bladder contains minimal urine.  Stomach and bowel loops otherwise normal appearance.  Appendix not visualized.  No mass, adenopathy, or free fluid.  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  IMPRESSION:  Right rectus sheath hematoma with extension of a small amount of hemorrhage into the prevesical space.  Multiple subcutaneous hematomas.  Moderate-sized hiatal hernia.  Sigmoid diverticulosis with resolution of the sigmoid diverticulitis changes seen on the previous exam.   Electronically Signed   By: Ulyses Southward M.D.   On: 08/27/2013 14:29    Scheduled Meds: . clonazePAM  0.5 mg Oral QHS  . docusate sodium  100 mg Oral BID  . flecainide  100 mg Oral BID  . levothyroxine  25 mcg Oral QAC breakfast  . metoprolol  50 mg Oral BID  . pantoprazole  40 mg Oral Q0600  . simvastatin  10 mg Oral QPM  . sodium chloride  3 mL Intravenous Q12H   Continuous Infusions: . sodium chloride 100 mL/hr (08/29/13 0806)       Time spent: 35 minutes    [facility] A  [facility] Pager [phone] If 7PM-7AM, please contact night-coverage at [email or web address], password [facility] 08/29/2013, 10:17 AM  LOS: 2 days

## 2013-08-29 NOTE — Evaluation (Signed)
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 Patient was recently discharged from the hospital on 08/17/2013 after being treated for an acute sigmoid diverticulitis with possible microperforation and abscess formation.  Clinical Impression  Pt admitted with the above. Pt currently with functional limitations due to the deficits listed below (see PT Problem List).  Pt limited on evaluation due to right lower quadrant pain and N&V sitting EOB. Pt will benefit from skilled PT to increase their independence and safety with mobility to allow discharge to the venue listed below.      PT Assessment  Patient needs continued PT services    Follow Up Recommendations  Home health PT;Supervision - Intermittent (may not need HHPT depending on progress and pain control )    Equipment Recommendations  Rolling walker with 5" wheels (will continue to assess)    Frequency Min 3X/week    Precautions / Restrictions Precautions Precautions: Fall   Pertinent Vitals/Pain Pt reports 8/10 right lower quadrant pain with mobility       Mobility  Bed Mobility Bed Mobility: Supine to Sit;Sitting - Scoot to Edge of Bed Supine to Sit: 3: Mod assist;With rails Sitting - Scoot to Edge of Bed: 3: Mod assist Details for Bed Mobility Assistance: (A) to initiate transfer with LE OOB and elevate trunk OOB with step by step instructions with hand placement.   Transfers Transfers: Sit to Stand;Stand to Sit Sit to Stand: 4: Min assist;From elevated surface;From bed Stand to Sit: 4: Min guard;To chair/3-in-1;With armrests Details for Transfer Assistance: VCs for hand placement  Ambulation/Gait Ambulation/Gait Assistance: 4: Min guard Ambulation Distance (Feet): 4 Feet (towards recliner) Assistive device: None Ambulation/Gait Assistance Details: Limited due to pain in right lower quadrant.  Pt continued to stay in flexed position holding onto right side to brace.  Gait Pattern: Step-to pattern;Decreased stride length;Shuffle;Trunk flexed;Antalgic Gait velocity: decreased    Exercises     PT Diagnosis: Difficulty walking;Generalized weakness;Acute pain  PT Problem List: Decreased strength;Decreased activity tolerance;Decreased mobility;Decreased knowledge of use of DME;Pain PT Treatment Interventions: DME instruction;Gait training;Stair training;Functional mobility training;Therapeutic activities;Therapeutic exercise;Patient/family education     PT Goals(Current goals can be found in the care plan section) Acute Rehab PT Goals Patient Stated Goal: To return home and feel better PT Goal Formulation: With patient Time For Goal Achievement: 09/05/13 Potential to Achieve Goals: Good  Visit Information  Last PT Received On: 08/29/13 Assistance Needed: +1 History of Present Illness:  Pt is 72 y/o female with history of hypertension, hypothyroidism, hyperlipidemia, 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 Patient was recently discharged from the hospital on 08/17/2013 after being treated for an acute sigmoid diverticulitis with possible microperforation and abscess formation.       Prior Functioning  Home  Living Family/patient expects to be discharged to:: Private residence Living Arrangements: Alone Available Help at Discharge: Family;Available 24 hours/day Type of  Home: Apartment Home Access: Level entry Home Layout: One level Home Equipment: Grab bars - tub/shower Additional Comments: Pt may stay with dtr at d/c if needed but would prefer to d/c home Prior Function Level of Independence: Independent Communication Communication: No difficulties Dominant Hand: Right    Cognition  Cognition Arousal/Alertness: Awake/alert Behavior During Therapy: WFL for tasks assessed/performed Overall Cognitive Status: Within Functional Limits for tasks assessed    Extremity/Trunk Assessment Lower Extremity Assessment Lower Extremity Assessment: RLE deficits/detail RLE Deficits / Details: At least 3/5 gross with functional assessment RLE: Unable to fully assess due to pain LLE Deficits / Details: At least 3/5 gross with functional assessment   Balance    End of Session PT - End of Session Equipment Utilized During Treatment: Gait belt Activity Tolerance: Patient limited by pain (Also limited by nausea and vomiting) Patient left: in chair;with call bell/phone within reach Nurse Communication: Mobility status  GP     Mckenzie Frank 08/29/2013, 1:17 PM  Jake Shark, PT DPT [phone]

## 2013-08-30 ENCOUNTER — Inpatient Hospital Stay (HOSPITAL_COMMUNITY): Payer: Medicare Other

## 2013-08-30 ENCOUNTER — Encounter (HOSPITAL_COMMUNITY): Payer: Self-pay | Admitting: General Surgery

## 2013-08-30 DIAGNOSIS — S301XXA Contusion of abdominal wall, initial encounter: Secondary | ICD-10-CM

## 2013-08-30 DIAGNOSIS — G250 Essential tremor: Secondary | ICD-10-CM

## 2013-08-30 LAB — BASIC METABOLIC PANEL
Chloride: 99 mEq/L (ref 96–112)
GFR calc Af Amer: 25 mL/min — ABNORMAL LOW (ref >90)
Glucose, Bld: 181 mg/dL — ABNORMAL HIGH (ref 70–99)
Potassium: 4.6 mEq/L (ref 3.5–5.1)

## 2013-08-30 LAB — CBC
HCT: 23 % — ABNORMAL LOW (ref 36.0–46.0)
HCT: 24.9 % — ABNORMAL LOW (ref 36.0–46.0)
Hemoglobin: 7.6 g/dL — ABNORMAL LOW (ref 12.0–15.0)
Hemoglobin: 8.5 g/dL — ABNORMAL LOW (ref 12.0–15.0)
MCH: 27.9 pg (ref 26.0–34.0)
MCHC: 33 g/dL (ref 30.0–36.0)
MCV: 84.6 fL (ref 78.0–100.0)
RBC: 2.72 MIL/uL — ABNORMAL LOW (ref 3.87–5.11)
RDW: 15.3 % (ref 11.5–15.5)
WBC: 14.2 10*3/uL — ABNORMAL HIGH (ref 4.0–10.5)

## 2013-08-30 LAB — PROTIME-INR: Prothrombin Time: 17.4 seconds — ABNORMAL HIGH (ref 11.6–15.2)

## 2013-08-30 MED ORDER — CEFAZOLIN SODIUM-DEXTROSE 2-3 GM-% IV SOLR
2.0000 g | Freq: Once | INTRAVENOUS | Status: AC
  Administered 2013-08-31: 09:00:00 2 g via INTRAVENOUS
  Filled 2013-08-30: qty 50

## 2013-08-30 MED ORDER — SODIUM CHLORIDE 0.9 % IV SOLN
INTRAVENOUS | Status: DC
  Administered 2013-08-30 – 2013-08-31 (×2): via INTRAVENOUS

## 2013-08-30 MED ORDER — MIRABEGRON ER 25 MG PO TB24
25.0000 mg | ORAL_TABLET | Freq: Every day | ORAL | Status: DC
  Administered 2013-08-31 – 2013-09-02 (×3): 25 mg via ORAL
  Filled 2013-08-30 (×3): qty 1

## 2013-08-30 MED ORDER — ALBUTEROL SULFATE (2.5 MG/3ML) 0.083% IN NEBU: 2.5000 mg | INHALATION_SOLUTION | RESPIRATORY_TRACT | Status: DC | PRN

## 2013-08-30 NOTE — Consult Note (Signed)
Mckenzie Frank September 27, 1940  [id number].   Primary Care MD: Dr. Eustaquio Boyden  Requesting MD: Dr. Clydia Llano Chief Complaint/Reason for Consult: abdominal hematoma HPI: This is a 72 yo female who was admitted recently for diverticulitis.  Her coumadin, on for A fib, was held.  At the time of discharge she was restarted on her coumadin and on a Lovenox bridge for 10 days.  Last Wednesday she developed crampy pain.  She saw her PCP on Friday, who sent her for a CT scan.  She was noted to have a large hematoma.  She was sent to [facility] where she was admitted.  She has not improved much and has had worsening nausea.  She is unable to have a BM.  Her hgb was still trending down and was given 2 units of pRBCs last night.  A repeat scan was performed today which revealed "Significant interval enlargement of the previously noted hematoma  in the right rectus abdominis musculature, now with extension of  hemorrhage into the subperitoneal spaces in the right hemipelvis  where there is a large extraperitoneal hematoma exerting significant  mass effect upon the urinary bladder and distal right ureter  resulting in mild to moderate right hydroureteronephrosis."  We have been asked to see the patient for further evaluation.   ROS: Please see HPI, otherwise patient admits to frequent urination.  All other systems have been reviewed and are negative.  Family History  Problem Relation Age of Onset  . CAD Mother     angina  . Cancer Paternal Grandfather     ?  Marland Kitchen Alzheimer's disease Father     died from PNA  . Diabetes Mother   . Stroke Mother 25    Past Medical History  Diagnosis Date  . Hyperlipidemia   . Hypertension   . Hypothyroidism     borderline  . Osteoarthritis of knee     s/p replacement  . Breast mass     left  . Benign head tremor   . Atrial fibrillation 2010    s/p DC cardioversion. on coumadin  . Insomnia   . Family history of anesthesia complication     daughter   nausea  .  GERD (gastroesophageal reflux disease)   . Chronic bronchitis     "used to get it alot; not so much anymore" (08/27/2013)  . Sleep apnea     "does not wear mask" (08/27/2013)  . Rectus sheath hematoma     right/notes 08/27/2013  . H/O hiatal hernia     moderate sized/notes 08/27/2013    Past Surgical History  Procedure Laterality Date  . Replacement total knee Bilateral 2005  . Appendectomy  1947  . Dilation and curettage of uterus    . Cholecystectomy  1980  . Cardiac catheterization      [facility];Dr. Juliann Pares  . Dexa  10/2012    WNL  . Tonsillectomy    . Joint replacement    . Abdominal exploration surgery  1971    "to see if I needed hysterectomy; dr thought qthing looked too good so he didn't do a hysterectomy at this time" (08/27/2013)  . Vaginal hysterectomy  1971    "2 wks after 1st dr said it was ok" (08/27/2013)  . Cardioversion      /notes 08/27/2013    Social History:  reports that she has never smoked. She has never used smokeless tobacco. She reports that she does not drink alcohol or use illicit drugs.  Allergies:  Allergies  Allergen Reactions  . Clindamycin/Lincomycin     unknown  . Codeine     unknown  . Doxycycline     unknown  . Paxil [Paroxetine Hcl]     Shaking   . Promethazine     unknown  . Sulfa Antibiotics     Hives   . Valium     unknown    Medications Prior to Admission  Medication Sig Dispense Refill  . amLODipine-benazepril (LOTREL) 10-40 MG per capsule Take 1 capsule by mouth daily.  90 capsule  0  . amoxicillin-clavulanate (AUGMENTIN) 875-125 MG per tablet Take 1 tablet by mouth every 12 (twelve) hours.  20 tablet  0  . clonazePAM (KLONOPIN) 0.5 MG tablet TAKE 1 TABLET BY MOUTH EVERY NIGHT AT BEDTIME  30 tablet  3  . flecainide (TAMBOCOR) 100 MG tablet Take 1 tablet (100 mg total) by mouth 2 (two) times daily.  180 tablet  0  . furosemide (LASIX) 40 MG tablet Take 1 tablet (40 mg total) by mouth daily. 2nd dose prn  90 tablet  1  .  lansoprazole (PREVACID) 15 MG capsule Take 1 capsule (15 mg total) by mouth daily.  90 capsule  0  . levothyroxine (SYNTHROID, LEVOTHROID) 25 MCG tablet Take 1 tablet (25 mcg total) by mouth daily.  90 tablet  1  . metoprolol (LOPRESSOR) 50 MG tablet Take 1 tablet (50 mg total) by mouth 2 (two) times daily.  180 tablet  0  . simvastatin (ZOCOR) 20 MG tablet Take 0.5 tablets (10 mg total) by mouth every evening.  45 tablet  0  . warfarin (COUMADIN) 5 MG tablet Take 2.5-5 mg by mouth See admin instructions. Take as directed by [facility]. Take half of the 5mg  on Mondays Wednesday and Friday and whole tab rest of days        Blood pressure 123/68, pulse 67, temperature 98.6 F (37 C), temperature source Oral, resp. rate 16, height 5\' 7"  (1.702 m), weight 275 lb 5.7 oz (124.9 kg), SpO2 96.00%. Physical Exam: General: pleasant, obese white female who is laying in bed in NAD HEENT: head is normocephalic, atraumatic.  Sclera are noninjected.  PERRL.  Ears and nose without any masses or lesions.  Mouth is pink and moist Heart: regular, rate, and rhythm.  Normal s1,s2. No obvious murmurs, gallops, or rubs noted.  Palpable radial and pedal pulses bilaterally Lungs: CTAB, no wheezes, rhonchi, or rales noted.  Respiratory effort nonlabored Abd: soft, minimal lower abdominal tenderness, some distention, obese,  Hypoactive BS, harder masses are palpable throughout her lower abdomen on both sides from small subcutaneous scattered hematomas.  Some distention is noted on right lower portion of abdomen.  Ecchymosis from lovenox shots throughout abdomen.   MS: all 4 extremities are symmetrical with no cyanosis, clubbing, or edema.  She has a resolving ecchymosis on her LUE from prior IV Skin: warm and dry with no masses, lesions, or rashes Psych: A&Ox3 with an appropriate affect.    Results for orders placed during the hospital encounter of 08/27/13 (from the past 48 hour(s))  CBC     Status: Abnormal    Collection Time    08/29/13 12:35 AM      Result Value Range   WBC 16.2 (*) 4.0 - 10.5 K/uL   RBC 2.72 (*) 3.87 - 5.11 MIL/uL   Hemoglobin 7.6 (*) 12.0 - 15.0 g/dL   HCT 16.1 (*) 09.6 - 04.5 %   MCV 84.6  78.0 -  100.0 fL   MCH 27.9  26.0 - 34.0 pg   MCHC 33.0  30.0 - 36.0 g/dL   RDW 16.1 (*) 09.6 - 04.5 %   Platelets 435 (*) 150 - 400 K/uL  BASIC METABOLIC PANEL     Status: Abnormal   Collection Time    08/29/13  7:45 AM      Result Value Range   Sodium 131 (*) 135 - 145 mEq/L   Potassium 4.6  3.5 - 5.1 mEq/L   Chloride 99  96 - 112 mEq/L   CO2 18 (*) 19 - 32 mEq/L   Glucose, Bld 122 (*) 70 - 99 mg/dL   BUN 38 (*) 6 - 23 mg/dL   Creatinine, Ser 4.09 (*) 0.50 - 1.10 mg/dL   Comment: DELTA CHECK NOTED   Calcium 7.9 (*) 8.4 - 10.5 mg/dL   GFR calc non Af Amer 23 (*) >90 mL/min   GFR calc Af Amer 27 (*) >90 mL/min   Comment: (NOTE)     The eGFR has been calculated using the CKD EPI equation.     This calculation has not been validated in all clinical situations.     eGFR's persistently <90 mL/min signify possible Chronic Kidney     Disease.  CBC     Status: Abnormal   Collection Time    08/29/13  7:45 AM      Result Value Range   WBC 15.2 (*) 4.0 - 10.5 K/uL   RBC 2.58 (*) 3.87 - 5.11 MIL/uL   Hemoglobin 7.3 (*) 12.0 - 15.0 g/dL   HCT 81.1 (*) 91.4 - 78.2 %   MCV 85.7  78.0 - 100.0 fL   MCH 28.3  26.0 - 34.0 pg   MCHC 33.0  30.0 - 36.0 g/dL   RDW 95.6 (*) 21.3 - 08.6 %   Platelets 445 (*) 150 - 400 K/uL  PROTIME-INR     Status: Abnormal   Collection Time    08/29/13  7:45 AM      Result Value Range   Prothrombin Time 18.9 (*) 11.6 - 15.2 seconds   INR 1.63 (*) 0.00 - 1.49  URINALYSIS, ROUTINE W REFLEX MICROSCOPIC     Status: Abnormal   Collection Time    08/29/13 12:00 PM      Result Value Range   Color, Urine RED (*) YELLOW   Comment: BIOCHEMICALS MAY BE AFFECTED BY COLOR   APPearance CLOUDY (*) CLEAR   Specific Gravity, Urine 1.022  1.005 - 1.030   pH 5.5  5.0  - 8.0   Glucose, UA NEGATIVE  NEGATIVE mg/dL   Hgb urine dipstick LARGE (*) NEGATIVE   Bilirubin Urine MODERATE (*) NEGATIVE   Ketones, ur 15 (*) NEGATIVE mg/dL   Protein, ur >578 (*) NEGATIVE mg/dL   Urobilinogen, UA 0.2  0.0 - 1.0 mg/dL   Nitrite NEGATIVE  NEGATIVE   Leukocytes, UA SMALL (*) NEGATIVE  URINE MICROSCOPIC-ADD ON     Status: None   Collection Time    08/29/13 12:00 PM      Result Value Range   WBC, UA 3-6  <3 WBC/hpf   RBC / HPF TOO NUMEROUS TO COUNT  <3 RBC/hpf   Urine-Other LESS THAN 10 mL OF URINE SUBMITTED    PREPARE RBC (CROSSMATCH)     Status: None   Collection Time    08/29/13  3:15 PM      Result Value Range   Order Confirmation ORDER PROCESSED BY BLOOD  BANK    TYPE AND SCREEN     Status: None   Collection Time    08/29/13  3:15 PM      Result Value Range   ABO/RH(D) AB POS     Antibody Screen NEG     Sample Expiration 09/01/2013     Unit Number [id number]     Blood Component Type RED CELLS,LR     Unit division 00     Status of Unit ISSUED,FINAL     Transfusion Status OK TO TRANSFUSE     Crossmatch Result Compatible     Unit Number [id number]     Blood Component Type RBC LR PHER2     Unit division 00     Status of Unit ISSUED     Transfusion Status OK TO TRANSFUSE     Crossmatch Result Compatible    BASIC METABOLIC PANEL     Status: Abnormal   Collection Time    08/30/13  9:28 AM      Result Value Range   Sodium 131 (*) 135 - 145 mEq/L   Potassium 4.6  3.5 - 5.1 mEq/L   Chloride 99  96 - 112 mEq/L   CO2 18 (*) 19 - 32 mEq/L   Glucose, Bld 181 (*) 70 - 99 mg/dL   BUN 48 (*) 6 - 23 mg/dL   Creatinine, Ser 3.08 (*) 0.50 - 1.10 mg/dL   Calcium 8.0 (*) 8.4 - 10.5 mg/dL   GFR calc non Af Amer 22 (*) >90 mL/min   GFR calc Af Amer 25 (*) >90 mL/min   Comment: (NOTE)     The eGFR has been calculated using the CKD EPI equation.     This calculation has not been validated in all clinical situations.     eGFR's persistently <90 mL/min signify  possible Chronic Kidney     Disease.  CBC     Status: Abnormal   Collection Time    08/30/13  9:28 AM      Result Value Range   WBC 14.2 (*) 4.0 - 10.5 K/uL   RBC 2.95 (*) 3.87 - 5.11 MIL/uL   Hemoglobin 8.5 (*) 12.0 - 15.0 g/dL   HCT 65.7 (*) 84.6 - 96.2 %   MCV 84.4  78.0 - 100.0 fL   MCH 28.8  26.0 - 34.0 pg   MCHC 34.1  30.0 - 36.0 g/dL   RDW 95.2  84.1 - 32.4 %   Platelets 405 (*) 150 - 400 K/uL  PROTIME-INR     Status: Abnormal   Collection Time    08/30/13 10:46 AM      Result Value Range   Prothrombin Time 17.4 (*) 11.6 - 15.2 seconds   INR 1.47  0.00 - 1.49   Ct Abdomen Pelvis Wo Contrast  08/30/2013   CLINICAL DATA:  Followup evaluation for rectus sheath hematoma. Increasing abdominal pain.  EXAM: CT ABDOMEN AND PELVIS WITHOUT CONTRAST  TECHNIQUE: Multidetector CT imaging of the abdomen and pelvis was performed following the standard protocol without intravenous contrast.  COMPARISON:  CT of the abdomen and pelvis 08/27/2013.  FINDINGS: Lung Bases: Moderate hiatal hernia. Atherosclerosis, including calcified atherosclerotic plaque in the left anterior descending and right coronary arteries.  Abdomen/Pelvis: Diffusely decreased attenuation throughout the hepatic parenchyma, compatible with hepatic steatosis. Status post cholecystectomy. The unenhanced appearance of the pancreas, spleen and bilateral adrenal glands is unremarkable. A sub cm fatty attenuation lesion in the lower pole of the left kidney is compatible  with a small angiomyolipoma. New mild to moderate right hydroureteronephrosis which appears to be related to extensive mass effect upon the distal 3rd of the right ureter (discussed below).  When compared to the prior examination, the amount of high attenuation material within the right side of the rectus abdominis musculature has significantly increased in volume, currently spanning 16.7 cm in length, measuring up to 8.1 x 4.5 cm transaxially. However, this hemorrhage has  also dissected into the subperitoneal spaces of the right side of the anatomic pelvis, where there is a very large extraperitoneal high attenuation fluid collection which measures up to 15.6 x 9.9 cm trans axially, extending over a length of approximately 12.3 cm craniocaudally. Notably, it is this extraperitoneal pelvic hemorrhage which exerts mass effect upon the urinary bladder displacing it into the far left side of the pelvis, and also exerting mass effect upon the distal 3rd of the right ureter near the right ureterovesicular junction, likely accounting for the right-sided hydroureteronephrosis. No definite intraperitoneal extension of this hemorrhage is noted at this time. Some of this hemorrhage is dissecting up the right side of the retroperitoneum.  Trace volume of ascites. No pneumoperitoneum. No pathologic distention of small bowel. Atherosclerosis throughout the abdominal and pelvic vasculature. Numerous colonic diverticulae noted, without surrounding inflammatory changes to suggest an acute diverticulitis at this time. Status post hysterectomy. Ovaries are not confidently identified may be surgically absent or atrophic.  Musculoskeletal: Multifocal high attenuation foci within the subcutaneous fat of the anterior abdominal wall bilaterally, similar to the prior study, with compatible with multiple additional foci of hemorrhage. There is obscuration of the normal intervening fat planes in the right flank musculature, with surrounding soft tissue stranding in the overlying subcutaneous fat, likely to reflect edema/inflammation related to resolving hematoma. There are no aggressive appearing lytic or blastic lesions noted in the visualized portions of the skeleton.  IMPRESSION: 1. Significant interval enlargement of the previously noted hematoma in the right rectus abdominis musculature, now with extension of hemorrhage into the subperitoneal spaces in the right hemipelvis where there is a large  extraperitoneal hematoma exerting significant mass effect upon the urinary bladder and distal right ureter resulting in mild to moderate right hydroureteronephrosis. 2. Multiple small hemorrhages in the subcutaneous fat of the anterior abdominal wall redemonstrated. 3. Extensive atherosclerosis, including at least 2 vessel coronary artery disease, as above. 4. Colonic diverticulosis without definite findings to suggest acute diverticulitis at this time. 5. Hepatic steatosis. 6. Postoperative changes, as above.   Electronically Signed   By: Trudie Reed M.D.   On: 08/30/2013 08:13   US Renal  08/30/2013   CLINICAL DATA:  Acute renal failure.  Hematuria.  Hypertension.  EXAM: RENAL/URINARY TRACT ULTRASOUND COMPLETE  COMPARISON:  08/27/2013 CT.  FINDINGS: Right Kidney:  Length: 11.2 cm.  Moderate hydronephrosis.  Etiology indeterminate.  Left Kidney:  Length: 10.6 cm. No left-sided hydronephrosis. 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  Bladder:  Patient voided prior to present exam and evaluation of the bladder is limited.  IMPRESSION: Moderate right-sided hydronephrosis.  Etiology indeterminate.  No evidence of left-sided hydronephrosis.  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   Electronically Signed   By: Bridgett Larsson M.D.   On: 08/30/2013 07:44       Assessment/Plan 1. Abdominal wall, right rectus hematoma with extension into the subperitoneal space with another large extraperitoneal hematoma 2. Bladder and ureter compression from mass effect from #1. Patient Active Problem List   Diagnosis Date Noted  . ARF  (acute renal failure) 08/29/2013  . RLQ abdominal pain 08/27/2013  . Abdominal wall hematoma 08/27/2013  . Coagulopathy 08/27/2013  . Nausea and vomiting in adult 08/27/2013  . Abnormal bruising 08/23/2013  . Leukocytosis, unspecified 08/14/2013  . Sigmoid diverticulitis 08/13/2013  . Mitral valve regurgitation 12/02/2012  . Medicare annual wellness visit, initial 10/29/2012  . Insomnia   . Atrial fibrillation   . Hyperlipidemia   . Hypertension   . Hypothyroidism   . Knee joint replacement status   . Breast mass   . Benign head tremor    Plan: 1. Nothing surgical to do about these hematomas currently.  Typically, these resolve on their own without any other intervention.  Agree with correction of coumadin and holding all anticoagulation.  Follow hgb and make sure it stabilizes.  Urology may want to review the CT scan to make sure there is no need for a stent or any other urologic workup.  Thank you for this consultation.  Lora Chavers E 08/30/2013, 1:00 PM Pager: [phone]

## 2013-08-30 NOTE — Progress Notes (Signed)
PT Cancellation Note  Patient Details Name: TONNIA BARDIN MRN: [id number] DOB: 01/01/1941   Cancelled Treatment:    Reason Eval/Treat Not Completed: Medical issues which prohibited therapy.  Pt  Just returned from CT and states MD told her to "stay in bed except going to the bathroom."  No bed rest order in chart and RN not aware of bed rest but pt and family member confirm this verbal order from MD.  PT to check tomorrow.   Rayhana Slider 08/30/2013, 9:10 AM

## 2013-08-30 NOTE — H&P (Signed)
Mckenzie Frank is an 72 y.o. female.   Chief Complaint: pt with increasing abd and back pain over last several days to few weeks CT 12/26 reveals large Rt rectus sheath hematoma Pt had been anticoagulated for atrial fib Off coumadin now; INR 1.47 today Hematoma encroaching Rt ureter and now with Rt hydronephrosis per [place] Not candidate for retrograde stent per Dr Arthor Captain who has spoken to Dr Annabell Howells Now scheduled for percutaneous nephrostomy - reviewed by Dr Deanne Coffer  HPI: HLD; HTN; morbid obese; a fib; GERD; sleep apnea; rectus sheath hematoma  Past Medical History  Diagnosis Date  . Hyperlipidemia   . Hypertension   . Hypothyroidism     borderline  . Osteoarthritis of knee     s/p replacement  . Breast mass     left  . Benign head tremor   . Atrial fibrillation 2010    s/p DC cardioversion. on coumadin  . Insomnia   . Family history of anesthesia complication     daughter   nausea  . GERD (gastroesophageal reflux disease)   . Chronic bronchitis     "used to get it alot; not so much anymore" (08/27/2013)  . Sleep apnea     "does not wear mask" (08/27/2013)  . Rectus sheath hematoma     right/notes 08/27/2013  . H/O hiatal hernia     moderate sized/notes 08/27/2013    Past Surgical History  Procedure Laterality Date  . Replacement total knee Bilateral 2005  . Appendectomy  1947  . Dilation and curettage of uterus    . Cholecystectomy  1980  . Cardiac catheterization      [facility];Dr. Juliann Pares  . Dexa  10/2012    WNL  . Tonsillectomy    . Joint replacement    . Abdominal exploration surgery  1971    "to see if I needed hysterectomy; dr thought qthing looked too good so he didn't do a hysterectomy at this time" (08/27/2013)  . Vaginal hysterectomy  1971    "2 wks after 1st dr said it was ok" (08/27/2013)  . Cardioversion      /notes 08/27/2013    Family History  Problem Relation Age of Onset  . CAD Mother     angina  . Cancer Paternal Grandfather     ?  Marland Kitchen  Alzheimer's disease Father     died from PNA  . Diabetes Mother   . Stroke Mother 26   Social History:  reports that she has never smoked. She has never used smokeless tobacco. She reports that she does not drink alcohol or use illicit drugs.  Allergies:  Allergies  Allergen Reactions  . Clindamycin/Lincomycin     unknown  . Codeine     unknown  . Doxycycline     unknown  . Paxil [Paroxetine Hcl]     Shaking   . Promethazine     unknown  . Sulfa Antibiotics     Hives   . Valium     unknown    Medications Prior to Admission  Medication Sig Dispense Refill  . amLODipine-benazepril (LOTREL) 10-40 MG per capsule Take 1 capsule by mouth daily.  90 capsule  0  . amoxicillin-clavulanate (AUGMENTIN) 875-125 MG per tablet Take 1 tablet by mouth every 12 (twelve) hours.  20 tablet  0  . clonazePAM (KLONOPIN) 0.5 MG tablet TAKE 1 TABLET BY MOUTH EVERY NIGHT AT BEDTIME  30 tablet  3  . flecainide (TAMBOCOR) 100 MG tablet Take 1 tablet (  100 mg total) by mouth 2 (two) times daily.  180 tablet  0  . furosemide (LASIX) 40 MG tablet Take 1 tablet (40 mg total) by mouth daily. 2nd dose prn  90 tablet  1  . lansoprazole (PREVACID) 15 MG capsule Take 1 capsule (15 mg total) by mouth daily.  90 capsule  0  . levothyroxine (SYNTHROID, LEVOTHROID) 25 MCG tablet Take 1 tablet (25 mcg total) by mouth daily.  90 tablet  1  . metoprolol (LOPRESSOR) 50 MG tablet Take 1 tablet (50 mg total) by mouth 2 (two) times daily.  180 tablet  0  . simvastatin (ZOCOR) 20 MG tablet Take 0.5 tablets (10 mg total) by mouth every evening.  45 tablet  0  . warfarin (COUMADIN) 5 MG tablet Take 2.5-5 mg by mouth See admin instructions. Take as directed by [facility]. Take half of the 5mg  on Mondays Wednesday and Friday and whole tab rest of days        Results for orders placed during the hospital encounter of 08/27/13 (from the past 48 hour(s))  CBC     Status: Abnormal   Collection Time    08/29/13 12:35 AM       Result Value Range   WBC 16.2 (*) 4.0 - 10.5 K/uL   RBC 2.72 (*) 3.87 - 5.11 MIL/uL   Hemoglobin 7.6 (*) 12.0 - 15.0 g/dL   HCT 14.7 (*) 82.9 - 56.2 %   MCV 84.6  78.0 - 100.0 fL   MCH 27.9  26.0 - 34.0 pg   MCHC 33.0  30.0 - 36.0 g/dL   RDW 13.0 (*) 86.5 - 78.4 %   Platelets 435 (*) 150 - 400 K/uL  BASIC METABOLIC PANEL     Status: Abnormal   Collection Time    08/29/13  7:45 AM      Result Value Range   Sodium 131 (*) 135 - 145 mEq/L   Potassium 4.6  3.5 - 5.1 mEq/L   Chloride 99  96 - 112 mEq/L   CO2 18 (*) 19 - 32 mEq/L   Glucose, Bld 122 (*) 70 - 99 mg/dL   BUN 38 (*) 6 - 23 mg/dL   Creatinine, Ser 6.96 (*) 0.50 - 1.10 mg/dL   Comment: DELTA CHECK NOTED   Calcium 7.9 (*) 8.4 - 10.5 mg/dL   GFR calc non Af Amer 23 (*) >90 mL/min   GFR calc Af Amer 27 (*) >90 mL/min   Comment: (NOTE)     The eGFR has been calculated using the CKD EPI equation.     This calculation has not been validated in all clinical situations.     eGFR's persistently <90 mL/min signify possible Chronic Kidney     Disease.  CBC     Status: Abnormal   Collection Time    08/29/13  7:45 AM      Result Value Range   WBC 15.2 (*) 4.0 - 10.5 K/uL   RBC 2.58 (*) 3.87 - 5.11 MIL/uL   Hemoglobin 7.3 (*) 12.0 - 15.0 g/dL   HCT 29.5 (*) 28.4 - 13.2 %   MCV 85.7  78.0 - 100.0 fL   MCH 28.3  26.0 - 34.0 pg   MCHC 33.0  30.0 - 36.0 g/dL   RDW 44.0 (*) 10.2 - 72.5 %   Platelets 445 (*) 150 - 400 K/uL  PROTIME-INR     Status: Abnormal   Collection Time    08/29/13  7:45 AM  Result Value Range   Prothrombin Time 18.9 (*) 11.6 - 15.2 seconds   INR 1.63 (*) 0.00 - 1.49  URINALYSIS, ROUTINE W REFLEX MICROSCOPIC     Status: Abnormal   Collection Time    08/29/13 12:00 PM      Result Value Range   Color, Urine RED (*) YELLOW   Comment: BIOCHEMICALS MAY BE AFFECTED BY COLOR   APPearance CLOUDY (*) CLEAR   Specific Gravity, Urine 1.022  1.005 - 1.030   pH 5.5  5.0 - 8.0   Glucose, UA NEGATIVE  NEGATIVE  mg/dL   Hgb urine dipstick LARGE (*) NEGATIVE   Bilirubin Urine MODERATE (*) NEGATIVE   Ketones, ur 15 (*) NEGATIVE mg/dL   Protein, ur >960 (*) NEGATIVE mg/dL   Urobilinogen, UA 0.2  0.0 - 1.0 mg/dL   Nitrite NEGATIVE  NEGATIVE   Leukocytes, UA SMALL (*) NEGATIVE  URINE MICROSCOPIC-ADD ON     Status: None   Collection Time    08/29/13 12:00 PM      Result Value Range   WBC, UA 3-6  <3 WBC/hpf   RBC / HPF TOO NUMEROUS TO COUNT  <3 RBC/hpf   Urine-Other LESS THAN 10 mL OF URINE SUBMITTED    PREPARE RBC (CROSSMATCH)     Status: None   Collection Time    08/29/13  3:15 PM      Result Value Range   Order Confirmation ORDER PROCESSED BY BLOOD BANK    TYPE AND SCREEN     Status: None   Collection Time    08/29/13  3:15 PM      Result Value Range   ABO/RH(D) AB POS     Antibody Screen NEG     Sample Expiration 09/01/2013     Unit Number [id number]     Blood Component Type RED CELLS,LR     Unit division 00     Status of Unit ISSUED,FINAL     Transfusion Status OK TO TRANSFUSE     Crossmatch Result Compatible     Unit Number [id number]     Blood Component Type RBC LR PHER2     Unit division 00     Status of Unit ISSUED     Transfusion Status OK TO TRANSFUSE     Crossmatch Result Compatible    BASIC METABOLIC PANEL     Status: Abnormal   Collection Time    08/30/13  9:28 AM      Result Value Range   Sodium 131 (*) 135 - 145 mEq/L   Potassium 4.6  3.5 - 5.1 mEq/L   Chloride 99  96 - 112 mEq/L   CO2 18 (*) 19 - 32 mEq/L   Glucose, Bld 181 (*) 70 - 99 mg/dL   BUN 48 (*) 6 - 23 mg/dL   Creatinine, Ser 4.69 (*) 0.50 - 1.10 mg/dL   Calcium 8.0 (*) 8.4 - 10.5 mg/dL   GFR calc non Af Amer 22 (*) >90 mL/min   GFR calc Af Amer 25 (*) >90 mL/min   Comment: (NOTE)     The eGFR has been calculated using the CKD EPI equation.     This calculation has not been validated in all clinical situations.     eGFR's persistently <90 mL/min signify possible Chronic Kidney     Disease.   CBC     Status: Abnormal   Collection Time    08/30/13  9:28 AM      Result Value Range  WBC 14.2 (*) 4.0 - 10.5 K/uL   RBC 2.95 (*) 3.87 - 5.11 MIL/uL   Hemoglobin 8.5 (*) 12.0 - 15.0 g/dL   HCT 81.1 (*) 91.4 - 78.2 %   MCV 84.4  78.0 - 100.0 fL   MCH 28.8  26.0 - 34.0 pg   MCHC 34.1  30.0 - 36.0 g/dL   RDW 95.6  21.3 - 08.6 %   Platelets 405 (*) 150 - 400 K/uL  PROTIME-INR     Status: Abnormal   Collection Time    08/30/13 10:46 AM      Result Value Range   Prothrombin Time 17.4 (*) 11.6 - 15.2 seconds   INR 1.47  0.00 - 1.49   Ct Abdomen Pelvis Wo Contrast  08/30/2013   CLINICAL DATA:  Followup evaluation for rectus sheath hematoma. Increasing abdominal pain.  EXAM: CT ABDOMEN AND PELVIS WITHOUT CONTRAST  TECHNIQUE: Multidetector CT imaging of the abdomen and pelvis was performed following the standard protocol without intravenous contrast.  COMPARISON:  CT of the abdomen and pelvis 08/27/2013.  FINDINGS: Lung Bases: Moderate hiatal hernia. Atherosclerosis, including calcified atherosclerotic plaque in the left anterior descending and right coronary arteries.  Abdomen/Pelvis: Diffusely decreased attenuation throughout the hepatic parenchyma, compatible with hepatic steatosis. Status post cholecystectomy. The unenhanced appearance of the pancreas, spleen and bilateral adrenal glands is unremarkable. 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 New mild to moderate right hydroureteronephrosis which appears to be related to extensive mass effect upon the distal 3rd of the right ureter (discussed below).  When compared to the prior examination, the amount of high attenuation material within the right side of the rectus abdominis musculature has significantly increased in volume, currently spanning 16.7 cm in length, measuring up to 8.1 x 4.5 cm transaxially. However, this hemorrhage has also dissected into the subperitoneal  spaces of the right side of the anatomic pelvis, where there is a very large extraperitoneal high attenuation fluid collection which measures up to 15.6 x 9.9 cm trans axially, extending over a length of approximately 12.3 cm craniocaudally. Notably, it is this extraperitoneal pelvic hemorrhage which exerts mass effect upon the urinary bladder displacing it into the far left side of the pelvis, and also exerting mass effect upon the distal 3rd of the right ureter near the right ureterovesicular junction, likely accounting for the right-sided hydroureteronephrosis. No definite intraperitoneal extension of this hemorrhage is noted at this time. Some of this hemorrhage is dissecting up the right side of the retroperitoneum.  Trace volume of ascites. No pneumoperitoneum. No pathologic distention of small bowel. Atherosclerosis throughout the abdominal and pelvic vasculature. Numerous colonic diverticulae noted, without surrounding inflammatory changes to suggest an acute diverticulitis at this time. Status post hysterectomy. Ovaries are not confidently identified may be surgically absent or atrophic.  Musculoskeletal: Multifocal high attenuation foci within the subcutaneous fat of the anterior abdominal wall bilaterally, similar to the prior study, with compatible with multiple additional foci of hemorrhage. There is obscuration of the normal intervening fat planes in the right flank musculature, with surrounding soft tissue stranding in the overlying subcutaneous fat, likely to reflect edema/inflammation related to resolving hematoma. There are no aggressive appearing lytic or blastic lesions noted in the visualized portions of the skeleton.  IMPRESSION: 1. Significant interval enlargement of the previously noted hematoma in the right rectus abdominis musculature, now with extension of hemorrhage into the subperitoneal spaces in the right hemipelvis  where there is a large extraperitoneal hematoma exerting significant  mass effect upon the urinary bladder and distal right ureter resulting in mild to moderate right hydroureteronephrosis. 2. Multiple small hemorrhages in the subcutaneous fat of the anterior abdominal wall redemonstrated. 3. Extensive atherosclerosis, including at least 2 vessel coronary artery disease, as above. 4. Colonic diverticulosis without definite findings to suggest acute diverticulitis at this time. 5. Hepatic steatosis. 6. Postoperative changes, as above.   Electronically Signed   By: Trudie Reed M.D.   On: 08/30/2013 08:13   US Renal  08/30/2013   CLINICAL DATA:  Acute renal failure.  Hematuria.  Hypertension.  EXAM: RENAL/URINARY TRACT ULTRASOUND COMPLETE  COMPARISON:  08/27/2013 CT.  FINDINGS: Right Kidney:  Length: 11.2 cm.  Moderate hydronephrosis.  Etiology indeterminate.  Left Kidney:  Length: 10.6 cm. No left-sided hydronephrosis. 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  Bladder:  Patient voided prior to present exam and evaluation of the bladder is limited.  IMPRESSION: Moderate right-sided hydronephrosis.  Etiology indeterminate.  No evidence of left-sided hydronephrosis.  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   Electronically Signed   By: Bridgett Larsson M.D.   On: 08/30/2013 07:44    Review of Systems  Constitutional: Negative for fever and weight loss.  Respiratory: Negative for shortness of breath and wheezing.   Cardiovascular: Negative for chest pain.  Gastrointestinal: Positive for nausea and abdominal pain.  Musculoskeletal: Positive for back pain.  Neurological: Positive for weakness  and headaches. Negative for dizziness.  Psychiatric/Behavioral: The patient is nervous/anxious.     Blood pressure 104/61, pulse 65, temperature 98.7 F (37.1 C), temperature source Oral, resp. rate 16, height 5\' 7"  (1.702 m), weight 275 lb 5.7 oz (124.9 kg), SpO2 94.00%. Physical Exam  Constitutional: She is oriented to person, place, and time. She appears well-nourished.  Cardiovascular: Normal rate.   No murmur heard. Respiratory: No respiratory distress. She has wheezes.  GI: She exhibits distension.  obese  Musculoskeletal: Normal range of motion.  Moves all 4s- weak  Neurological: She is alert and oriented to person, place, and time.  Skin: Skin is warm and dry.  Psychiatric: She has a normal mood and affect. Her behavior is normal. Judgment normal.     Assessment/Plan Rt Hydro secondary large Rt rectus sheath hematoma PCN requested per Urology and [facility] Pt and family aware of procedure benefits and risks and agreeable to proceed Consent signed and in chart Ancef ordered Off coumadin- INR 1.47 today   Mckenzie Frank A 08/30/2013, 4:29 PM

## 2013-08-30 NOTE — Progress Notes (Signed)
Marcie Bal to be D/C'd [facility] per MD order.  Discussed with the patient and all questions fully answered.    Medication List    ASK your doctor about these medications       amLODipine-benazepril 10-40 MG per capsule  Commonly known as:  LOTREL  Take 1 capsule by mouth daily.     amoxicillin-clavulanate 875-125 MG per tablet  Commonly known as:  AUGMENTIN  Take 1 tablet by mouth every 12 (twelve) hours.     clonazePAM 0.5 MG tablet  Commonly known as:  KLONOPIN  TAKE 1 TABLET BY MOUTH EVERY NIGHT AT BEDTIME     flecainide 100 MG tablet  Commonly known as:  TAMBOCOR  Take 1 tablet (100 mg total) by mouth 2 (two) times daily.     furosemide 40 MG tablet  Commonly known as:  LASIX  Take 1 tablet (40 mg total) by mouth daily. 2nd dose prn     lansoprazole 15 MG capsule  Commonly known as:  PREVACID  Take 1 capsule (15 mg total) by mouth daily.     levothyroxine 25 MCG tablet  Commonly known as:  SYNTHROID, LEVOTHROID  Take 1 tablet (25 mcg total) by mouth daily.     metoprolol 50 MG tablet  Commonly known as:  LOPRESSOR  Take 1 tablet (50 mg total) by mouth 2 (two) times daily.     simvastatin 20 MG tablet  Commonly known as:  ZOCOR  Take 0.5 tablets (10 mg total) by mouth every evening.     warfarin 5 MG tablet  Commonly known as:  COUMADIN  Take 2.5-5 mg by mouth See admin instructions. Take as directed by [facility]. Take half of the 5mg  on Mondays Wednesday and Friday and whole tab rest of days        VVS, Skin clean, dry and intact without evidence of skin break down, no evidence of skin tears noted. IV catheter discontinued intact. Site without signs and symptoms of complications. Dressing and pressure applied.  An After Visit Summary was printed and given to PTAR to give to nurse at [facility] . Follow up appointments , new prescriptions and medication administration times given to nurse a [facility] and all questions answered. HH  nurse for stage 2 on sacrum and PT ordered.  Patient escorted via stretcher, and D/C  To [facility]  via [place], [place] 08/30/2013 2:52 PM

## 2013-08-30 NOTE — Progress Notes (Signed)
OT Cancellation Note  Patient Details Name: DEWANA AMMIRATI MRN: [id number] DOB: Jul 28, 1941   Cancelled Treatment:    Reason Eval/Treat Not Completed:  (Pt. refused due to nausea, fatigue)  Graeme Menees A 08/30/2013, 1:58 PM

## 2013-08-30 NOTE — Consult Note (Signed)
Subjective: I was asked to see Mckenzie Frank in consultation by Dr. Arthor Captain for right hydronephrosis.  Mckenzie Frank is admitted with a right rectus sheath hematoma that occurred as she was on both lovenox and warfarin after being off of the warfarin that she has been on for afib during a recent admission for diverticulitis.  She has continued to have pain in the RLQ and progressive renal insufficiency.  She had a renal US this morning that showed right hydronephrosis of uncertain etiology and a subsequent CT showed progression of the hematoma with a large right pelvic extension with marked compression of the bladder into the left pelvis and right ureteral obstruction from the resulting mass.   This was not present on the CT from 12/26.  She is also having a new complaint of urgency and frequency with urge incontinence that is probably from her compressed bladder with reduced capacity. ROS: Negative except for nausea this morning, constipation with no BM since friday but she has eaten little and as noted above.  She is otherwise without complaints.   She has had no fever.   No current facility-administered medications on file prior to encounter.   Current Outpatient Prescriptions on File Prior to Encounter  Medication Sig Dispense Refill  . amLODipine-benazepril (LOTREL) 10-40 MG per capsule Take 1 capsule by mouth daily.  90 capsule  0  . amoxicillin-clavulanate (AUGMENTIN) 875-125 MG per tablet Take 1 tablet by mouth every 12 (twelve) hours.  20 tablet  0  . clonazePAM (KLONOPIN) 0.5 MG tablet TAKE 1 TABLET BY MOUTH EVERY NIGHT AT BEDTIME  30 tablet  3  . flecainide (TAMBOCOR) 100 MG tablet Take 1 tablet (100 mg total) by mouth 2 (two) times daily.  180 tablet  0  . furosemide (LASIX) 40 MG tablet Take 1 tablet (40 mg total) by mouth daily. 2nd dose prn  90 tablet  1  . lansoprazole (PREVACID) 15 MG capsule Take 1 capsule (15 mg total) by mouth daily.  90 capsule  0  . levothyroxine (SYNTHROID,  LEVOTHROID) 25 MCG tablet Take 1 tablet (25 mcg total) by mouth daily.  90 tablet  1  . metoprolol (LOPRESSOR) 50 MG tablet Take 1 tablet (50 mg total) by mouth 2 (two) times daily.  180 tablet  0  . simvastatin (ZOCOR) 20 MG tablet Take 0.5 tablets (10 mg total) by mouth every evening.  45 tablet  0   Allergies  Allergen Reactions  . Clindamycin/Lincomycin     unknown  . Codeine     unknown  . Doxycycline     unknown  . Paxil [Paroxetine Hcl]     Shaking   . Promethazine     unknown  . Sulfa Antibiotics     Hives   . Valium     unknown   Past Medical History  Diagnosis Date  . Hyperlipidemia   . Hypertension   . Hypothyroidism     borderline  . Osteoarthritis of knee     s/p replacement  . Breast mass     left  . Benign head tremor   . Atrial fibrillation 2010    s/p DC cardioversion. on coumadin  . Insomnia   . Family history of anesthesia complication     daughter   nausea  . GERD (gastroesophageal reflux disease)   . Chronic bronchitis     "used to get it alot; not so much anymore" (08/27/2013)  . Sleep apnea     "  does not wear mask" (08/27/2013)  . Rectus sheath hematoma     right/notes 08/27/2013  . H/O hiatal hernia     moderate sized/notes 08/27/2013   Past Surgical History  Procedure Laterality Date  . Replacement total knee Bilateral 2005  . Appendectomy  1947  . Dilation and curettage of uterus    . Cholecystectomy  1980  . Cardiac catheterization      [facility];Dr. Juliann Pares  . Dexa  10/2012    WNL  . Tonsillectomy    . Joint replacement    . Abdominal exploration surgery  1971    "to see if I needed hysterectomy; dr thought qthing looked too good so he didn't do a hysterectomy at this time" (08/27/2013)  . Vaginal hysterectomy  1971    "2 wks after 1st dr said it was ok" (08/27/2013)  . Cardioversion      /notes 08/27/2013   History   Social History  . Marital Status: Widowed    Spouse Name: N/A    Number of Children: N/A  . Years of  Education: N/A   Occupational History  . Not on file.   Social History Main Topics  . Smoking status: Never Smoker   . Smokeless tobacco: Never Used  . Alcohol Use: No  . Drug Use: No  . Sexual Activity: No   Other Topics Concern  . Not on file   Social History Narrative   Caffeine: 1 cup coffee/day   Lives alone in senior living.  Daughter in [place]   Occupation: retired, Engineering geologist   Edu: HS   Activity: walks the track   Diet:    Family History  Problem Relation Age of Onset  . CAD Mother     angina  . Cancer Paternal Grandfather     ?  Marland Kitchen Alzheimer's disease Father     died from PNA  . Diabetes Mother   . Stroke Mother 47    Objective: Vital signs in last 24 hours: Temp:  [98.3 F (36.8 C)-99.3 F (37.4 C)] 99.3 F (37.4 C) (12/29 2108) Pulse Rate:  [64-71] 71 (12/29 2108) Resp:  [16-20] 20 (12/29 2108) BP: (104-137)/(61-78) 126/69 mmHg (12/29 2108) SpO2:  [94 %-98 %] 96 % (12/29 2108) Weight:  [124.9 kg (275 lb 5.7 oz)] 124.9 kg (275 lb 5.7 oz) (12/29 0404)  Intake/Output from previous day: 12/28 0701 - 12/29 0700 In: 2405.3 [P.O.:300; I.V.:1658.3; Blood:447] Out: 615 [Urine:615] Intake/Output this shift: Total I/O In: -  Out: 150 [Urine:150]  General appearance: alert, no distress and she appears uncomfortable. Resp: normal effort Cardio: irregularly irregular rhythm GI: soft, obese with abdominal wall hematomas in the RLQ.  RLQ and suprapubic pain and fullness.   Right CVAT.   No HSM or hernia. Extremities: no edema.   She has some ecchymosis on the right inner thigh. Skin is warm and dry.   Lab Results:   Recent Labs  08/29/13 0745 08/30/13 0928  WBC 15.2* 14.2*  HGB 7.3* 8.5*  HCT 22.1* 24.9*  PLT 445* 405*   BMET  Recent Labs  08/29/13 0745 08/30/13 0928  NA 131* 131*  K 4.6 4.6  CL 99 99  CO2 18* 18*  GLUCOSE 122* 181*  BUN 38* 48*  CREATININE 2.02* 2.14*  CALCIUM 7.9* 8.0*   PT/INR  Recent Labs  08/29/13 0745  08/30/13 1046  LABPROT 18.9* 17.4*  INR 1.63* 1.47   ABG No results found for this basename: PHART, PCO2, PO2, HCO3,  in  the last 72 hours  Studies/Results: Ct Abdomen Pelvis Wo Contrast  08/30/2013   CLINICAL DATA:  Followup evaluation for rectus sheath hematoma. Increasing abdominal pain.  EXAM: CT ABDOMEN AND PELVIS WITHOUT CONTRAST  TECHNIQUE: Multidetector CT imaging of the abdomen and pelvis was performed following the standard protocol without intravenous contrast.  COMPARISON:  CT of the abdomen and pelvis 08/27/2013.  FINDINGS: Lung Bases: Moderate hiatal hernia. Atherosclerosis, including calcified atherosclerotic plaque in the left anterior descending and right coronary arteries.  Abdomen/Pelvis: Diffusely decreased attenuation throughout the hepatic parenchyma, compatible with hepatic steatosis. Status post cholecystectomy. The unenhanced appearance of the pancreas, spleen and bilateral adrenal glands is unremarkable. 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 New mild to moderate right hydroureteronephrosis which appears to be related to extensive mass effect upon the distal 3rd of the right ureter (discussed below).  When compared to the prior examination, the amount of high attenuation material within the right side of the rectus abdominis musculature has significantly increased in volume, currently spanning 16.7 cm in length, measuring up to 8.1 x 4.5 cm transaxially. However, this hemorrhage has also dissected into the subperitoneal spaces of the right side of the anatomic pelvis, where there is a very large extraperitoneal high attenuation fluid collection which measures up to 15.6 x 9.9 cm trans axially, extending over a length of approximately 12.3 cm craniocaudally. Notably, it is this extraperitoneal pelvic hemorrhage which exerts mass effect upon the urinary bladder displacing it into the far left side of the  pelvis, and also exerting mass effect upon the distal 3rd of the right ureter near the right ureterovesicular junction, likely accounting for the right-sided hydroureteronephrosis. No definite intraperitoneal extension of this hemorrhage is noted at this time. Some of this hemorrhage is dissecting up the right side of the retroperitoneum.  Trace volume of ascites. No pneumoperitoneum. No pathologic distention of small bowel. Atherosclerosis throughout the abdominal and pelvic vasculature. Numerous colonic diverticulae noted, without surrounding inflammatory changes to suggest an acute diverticulitis at this time. Status post hysterectomy. Ovaries are not confidently identified may be surgically absent or atrophic.  Musculoskeletal: Multifocal high attenuation foci within the subcutaneous fat of the anterior abdominal wall bilaterally, similar to the prior study, with compatible with multiple additional foci of hemorrhage. There is obscuration of the normal intervening fat planes in the right flank musculature, with surrounding soft tissue stranding in the overlying subcutaneous fat, likely to reflect edema/inflammation related to resolving hematoma. There are no aggressive appearing lytic or blastic lesions noted in the visualized portions of the skeleton.  IMPRESSION: 1. Significant interval enlargement of the previously noted hematoma in the right rectus abdominis musculature, now with extension of hemorrhage into the subperitoneal spaces in the right hemipelvis where there is a large extraperitoneal hematoma exerting significant mass effect upon the urinary bladder and distal right ureter resulting in mild to moderate right hydroureteronephrosis. 2. Multiple small hemorrhages in the subcutaneous fat of the anterior abdominal wall redemonstrated. 3. Extensive atherosclerosis, including at least 2 vessel coronary artery disease, as above. 4. Colonic diverticulosis without definite findings to suggest acute  diverticulitis at this time. 5. Hepatic steatosis. 6. Postoperative changes, as above.   Electronically Signed   By: Trudie Reed M.D.   On: 08/30/2013 08:13   US Renal  08/30/2013   CLINICAL DATA:  Acute renal failure.  Hematuria.  Hypertension.  EXAM: RENAL/URINARY TRACT ULTRASOUND COMPLETE  COMPARISON:  08/27/2013  CT.  FINDINGS: Right Kidney:  Length: 11.2 cm.  Moderate hydronephrosis.  Etiology indeterminate.  Left Kidney:  Length: 10.6 cm. No left-sided hydronephrosis. 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  Bladder:  Patient voided prior to present exam and evaluation of the bladder is limited.  IMPRESSION: Moderate right-sided hydronephrosis.  Etiology indeterminate.  No evidence of left-sided hydronephrosis.  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   Electronically Signed   By: Bridgett Larsson M.D.   On: 08/30/2013 07:44    Anti-infectives: Anti-infectives   Start     Dose/Rate Route Frequency Ordered Stop   08/31/13 0000  ceFAZolin (ANCEF) IVPB 2 g/50 mL premix    Comments:  Hang ON CALL to xray 12/30   2 g 100 mL/hr over 30 Minutes Intravenous  Once 08/30/13 1628        Current Facility-Administered Medications  Medication Dose Route Frequency Provider Last Rate Last Dose  . 0.9 %  sodium chloride infusion   Intravenous Continuous Clydia Llano, MD 75 mL/hr at 08/30/13 1447    . acetaminophen (TYLENOL) tablet 650 mg  650 mg Oral Q6H PRN Rodolph Bong, MD   650 mg at 08/30/13 0703   Or  . acetaminophen (TYLENOL) suppository 650 mg  650 mg Rectal Q6H PRN Rodolph Bong, MD      . albuterol  (PROVENTIL) (2.5 MG/3ML) 0.083% nebulizer solution 2.5 mg  2.5 mg Nebulization Q2H PRN Clydia Llano, MD      . alum & mag hydroxide-simeth (MAALOX/MYLANTA) 200-200-20 MG/5ML suspension 30 mL  30 mL Oral Q6H PRN Rodolph Bong, MD      . Melene Muller ON 08/31/2013] ceFAZolin (ANCEF) IVPB 2 g/50 mL premix  2 g Intravenous Once Robet Leu, PA-C      . clonazePAM Scarlette Calico) tablet 0.5 mg  0.5 mg Oral QHS Rodolph Bong, MD   0.5 mg at 08/29/13 2215  . docusate sodium (COLACE) capsule 100 mg  100 mg Oral BID Rodolph Bong, MD   100 mg at 08/30/13 1050  . flecainide (TAMBOCOR) tablet 100 mg  100 mg Oral BID Rodolph Bong, MD   100 mg at 08/30/13 1049  . levothyroxine (SYNTHROID, LEVOTHROID) tablet 25 mcg  25 mcg Oral QAC breakfast Rodolph Bong, MD   25 mcg at 08/30/13 0813  . metoprolol (LOPRESSOR) tablet 50 mg  50 mg Oral BID Rodolph Bong, MD   50 mg at 08/30/13 1044  . morphine 2 MG/ML injection 2 mg  2 mg Intravenous Q4H PRN Rodolph Bong, MD   2 mg at 08/28/13 0354  . ondansetron (ZOFRAN) tablet 4 mg  4 mg Oral Q6H PRN Rodolph Bong, MD   4 mg at 08/30/13 0703   Or  . ondansetron [facility]) injection 4 mg  4 mg Intravenous Q6H PRN Rodolph Bong, MD   4 mg at 08/29/13 1610  . oxyCODONE (Oxy IR/ROXICODONE) immediate release tablet 5 mg  5 mg Oral Q4H PRN Rodolph Bong, MD   5 mg at 08/29/13 2215  . pantoprazole (PROTONIX) EC tablet 40 mg  40 mg Oral  [id number] Rodolph Bong, MD   40 mg at 08/30/13 1049  . polyethylene glycol (MIRALAX / GLYCOLAX) packet 17 g  17 g Oral Daily PRN Rodolph Bong, MD      . simvastatin (ZOCOR) tablet 10 mg  10 mg Oral QPM Rodolph Bong, MD   10 mg at 08/30/13 1801  . sodium chloride 0.9 % injection 3 mL  3 mL Intravenous Q12H Rodolph Bong, MD   3 mL at 08/29/13 2214  . sorbitol 70 % solution 30 mL  30 mL Oral Daily PRN Rodolph Bong, MD       I have reviewed her recent CT films and labs and discussed her case with Dr.  Arthor Captain. I discussed her case in detail with her daughter who provided the bulk of the history.  Assessment: Right hydronephrosis from ureteral obstruction by a large right pelvic hematoma. Renal insufficiency. Small bladder capacity from the compressive hematoma with urge incontinence and frequency.  Plan: I have recommended she have a right perc tube placed.   That is to be done tomorrow and will need to be left until there is sufficient hematoma resolution to allow relief of the ureteral obstruction. She will need serial CT's and antegrade nephrostograms at intervals to assess progress with the first in about 2 weeks. I am not sure we have good options for the incontinence at this time, but I would prefer to avoid a foley. I have suggested a bedside commode because of her difficulty with mobility with her weight and pain and we can consider myrbetriq 25mg  for the overactive bladder as it doesn't contribute to constipation which is an issue for her.   I would like to avoid anticholinergics. I will continue to follow.   Time in consultation 60 minutes with 35 minutes of face to face time.   CC: Dr. Clydia Llano   LOS: 3 days    Anner Crete 08/30/2013

## 2013-08-30 NOTE — Clinical Documentation Improvement (Signed)
THIS DOCUMENT IS NOT A PERMANENT PART OF THE MEDICAL RECORD  Please update your documentation with the medical record to reflect your response to this query. If you need help knowing how to do this please call [phone].  08/30/13  Dear Dr. Arthor Captain!  In an effort to better capture your patient's severity of illness, reflect appropriate length of stay and utilization of resources, a review of the patient medical record has revealed the following indicators.   Based on your clinical judgment, please clarify and document in a progress note and/or discharge summary the clinical condition associated with the following supporting information: In responding to this query please exercise your independent judgment.  The fact that a query is asked, does not imply that any particular answer is desired or expected.  Hello Dr. Arthor Captain!   According to the documented Height and Weight in [facility], Mckenzie Frank's BMI is 41.35. If your clinical findings/judgment agrees with this,if possible, could you please help clarify the suspected diagnosis in the progress note and discharge summary. THANK YOU!    [facility]: A diagnosis of UNDERWEIGHT or MORBID OBESITY should have the BMI documented along with it.  Possible Clinical Conditions?  - Obesity  - Morbid Obesity  - Other condition (please document in the progress notes and/or discharge summary)   Thank You,  Saul Fordyce  Clinical Documentation Specialist: [phone] Health Information Management [place]

## 2013-08-30 NOTE — Progress Notes (Signed)
[facility] PROGRESS NOTE  Mckenzie Frank ZOX:[id number] DOB: 1940-12-31 DOA: 08/27/2013 PCP: Eustaquio Boyden, MD  HPI/Subjective: CT scan showed worsening of the hematoma. There is development of right-sided hydronephrosis secondary to hematoma.  Assessment/Plan: Principal Problem:   Abdominal wall hematoma Active Problems:   Atrial fibrillation   Hyperlipidemia   Hypertension   Hypothyroidism   Abnormal bruising   Coagulopathy   Nausea and vomiting in adult   ARF (acute renal failure)   Abdominal wall hematoma -Rectus sheath hematoma with multiple small subcutaneous hematomas in the anterior abdominal wall. -This is likely secondary to anticoagulation. -Patient was on Coumadin atenolol respiration, INR was 2.3, vitamin K given. -She will need to be off of anticoagulation for at least 2 weeks. -Gen. surgery consulted, recommended watching of the hematoma.  Right-sided hydronephrosis -Secondary to a hematoma, asked urology to evaluate. -Urology recommended percutaneous drain, IR to be consulted.  Anemia -Acute blood loss anemia secondary to intramuscular bleeding. -Watch hemoglobin closely, might drop further with hydration. -Hemoglobin dropped to 7.3, likely secondary to the abdominal wall hematoma, transfuse 2 units of packed RBCs.  Acute renal failure -Baseline creatinine is 0.7, creatinine increased to 2.0. -Had low blood pressure of 94/44 on the 26th, also had a contrasted CT scan. -Right percutaneous renal tube recommended by nephrology.  Atrial fibrillation -Patient is on metoprolol and flecainide for rate control. -Anticoagulation is on hold secondary to abdominal wall hematoma.  Hypertension -On metoprolol, Norvasc and aspirin on hold, restart as appropriate.  Recently treated sigmoid diverticulitis -CT scan of abdomen pelvis showed resolution of patient diverticulitis.  Code Status: Full code Family Communication: Plan discussed with the  patient. Disposition Plan: Remains inpatient   Consultants:  None  Procedures:  None  Antibiotics:  None   Objective: Filed Vitals:   08/30/13 1406  BP: 104/61  Pulse: 65  Temp: 98.7 F (37.1 C)  Resp: 16    Intake/Output Summary (Last 24 hours) at 08/30/13 1414 Last data filed at 08/30/13 1224  Gross per 24 hour  Intake 2645.33 ml  Output    625 ml  Net 2020.33 ml   Filed Weights   08/28/13 0500 08/29/13 0539 08/30/13 0404  Weight: 119.8 kg (264 lb 1.8 oz) 118.298 kg (260 lb 12.8 oz) 124.9 kg (275 lb 5.7 oz)    Exam: General: Alert and awake, oriented x3, not in any acute distress. HEENT: anicteric sclera, pupils reactive to light and accommodation, EOMI CVS: S1-S2 clear, no murmur rubs or gallops Chest: clear to auscultation bilaterally, no wheezing, rales or rhonchi Abdomen: soft nontender, nondistended, normal bowel sounds, no organomegaly Extremities: no cyanosis, clubbing or edema noted bilaterally Neuro: Cranial nerves II-XII intact, no focal neurological deficits  Data Reviewed: Basic Metabolic Panel:  Recent Labs Lab 08/27/13 1036 08/27/13 2042 08/28/13 0540 08/29/13 0745 08/30/13 0928  NA 136  --  133* 131* 131*  K 4.4  --  4.6 4.6 4.6  CL 101  --  99 99 99  CO2 26  --  22 18* 18*  GLUCOSE 96  --  125* 122* 181*  BUN 17  --  23 38* 48*  CREATININE 0.7  --  1.13* 2.02* 2.14*  CALCIUM 9.0  --  7.9* 7.9* 8.0*  MG  --  2.1  --   --   --    Liver Function Tests: No results found for this basename: AST, ALT, ALKPHOS, BILITOT, PROT, ALBUMIN,  in the last 168 hours No results found for this basename:  LIPASE, AMYLASE,  in the last 168 hours No results found for this basename: AMMONIA,  in the last 168 hours CBC:  Recent Labs Lab 08/27/13 1036 08/27/13 2042 08/28/13 0540 08/29/13 0035 08/29/13 0745 08/30/13 0928  WBC 10.3 12.7* 12.6* 16.2* 15.2* 14.2*  NEUTROABS 8.2*  --   --   --   --   --   HGB 11.2* 10.0* 8.6* 7.6* 7.3* 8.5*  HCT  34.0* 31.0* 26.5* 23.0* 22.1* 24.9*  MCV 84.7 86.4 86.3 84.6 85.7 84.4  PLT 396.0 408* 427* 435* 445* 405*   Cardiac Enzymes: No results found for this basename: CKTOTAL, CKMB, CKMBINDEX, TROPONINI,  in the last 168 hours BNP (last 3 results) No results found for this basename: PROBNP,  in the last 8760 hours CBG: No results found for this basename: GLUCAP,  in the last 168 hours  Micro Recent Results (from the past 240 hour(s))  URINE CULTURE     Status: None   Collection Time    08/27/13  8:07 PM      Result Value Range Status   Specimen Description URINE, RANDOM   Final   Special Requests NONE   Final   Culture  Setup Time     Final   Value: 08/28/2013 14:18     Performed at [facility] Count     Final   Value: NO GROWTH     Performed at [facility]   Culture     Final   Value: NO GROWTH     Performed at [facility]   Report Status 08/29/2013 FINAL   Final     Studies: Ct Abdomen Pelvis Wo Contrast  08/30/2013   CLINICAL DATA:  Followup evaluation for rectus sheath hematoma. Increasing abdominal pain.  EXAM: CT ABDOMEN AND PELVIS WITHOUT CONTRAST  TECHNIQUE: Multidetector CT imaging of the abdomen and pelvis was performed following the standard protocol without intravenous contrast.  COMPARISON:  CT of the abdomen and pelvis 08/27/2013.  FINDINGS: Lung Bases: Moderate hiatal hernia. Atherosclerosis, including calcified atherosclerotic plaque in the left anterior descending and right coronary arteries.  Abdomen/Pelvis: Diffusely decreased attenuation throughout the hepatic parenchyma, compatible with hepatic steatosis. Status post cholecystectomy. The unenhanced appearance of the pancreas, spleen and bilateral adrenal glands is unremarkable. 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 New mild to moderate right hydroureteronephrosis which appears to be related to extensive  mass effect upon the distal 3rd of the right ureter (discussed below).  When compared to the prior examination, the amount of high attenuation material within the right side of the rectus abdominis musculature has significantly increased in volume, currently spanning 16.7 cm in length, measuring up to 8.1 x 4.5 cm transaxially. However, this hemorrhage has also dissected into the subperitoneal spaces of the right side of the anatomic pelvis, where there is a very large extraperitoneal high attenuation fluid collection which measures up to 15.6 x 9.9 cm trans axially, extending over a length of approximately 12.3 cm craniocaudally. Notably, it is this extraperitoneal pelvic hemorrhage which exerts mass effect upon the urinary bladder displacing it into the far left side of the pelvis, and also exerting mass effect upon the distal 3rd of the right ureter near the right ureterovesicular junction, likely accounting for the right-sided hydroureteronephrosis. No definite intraperitoneal extension of this hemorrhage is noted at this time. Some of this hemorrhage is dissecting up the right  side of the retroperitoneum.  Trace volume of ascites. No pneumoperitoneum. No pathologic distention of small bowel. Atherosclerosis throughout the abdominal and pelvic vasculature. Numerous colonic diverticulae noted, without surrounding inflammatory changes to suggest an acute diverticulitis at this time. Status post hysterectomy. Ovaries are not confidently identified may be surgically absent or atrophic.  Musculoskeletal: Multifocal high attenuation foci within the subcutaneous fat of the anterior abdominal wall bilaterally, similar to the prior study, with compatible with multiple additional foci of hemorrhage. There is obscuration of the normal intervening fat planes in the right flank musculature, with surrounding soft tissue stranding in the overlying subcutaneous fat, likely to reflect edema/inflammation related to resolving  hematoma. There are no aggressive appearing lytic or blastic lesions noted in the visualized portions of the skeleton.  IMPRESSION: 1. Significant interval enlargement of the previously noted hematoma in the right rectus abdominis musculature, now with extension of hemorrhage into the subperitoneal spaces in the right hemipelvis where there is a large extraperitoneal hematoma exerting significant mass effect upon the urinary bladder and distal right ureter resulting in mild to moderate right hydroureteronephrosis. 2. Multiple small hemorrhages in the subcutaneous fat of the anterior abdominal wall redemonstrated. 3. Extensive atherosclerosis, including at least 2 vessel coronary artery disease, as above. 4. Colonic diverticulosis without definite findings to suggest acute diverticulitis at this time. 5. Hepatic steatosis. 6. Postoperative changes, as above.   Electronically Signed   By: Trudie Reed M.D.   On: 08/30/2013 08:13   US Renal  08/30/2013   CLINICAL DATA:  Acute renal failure.  Hematuria.  Hypertension.  EXAM: RENAL/URINARY TRACT ULTRASOUND COMPLETE  COMPARISON:  08/27/2013 CT.  FINDINGS: Right Kidney:  Length: 11.2 cm.  Moderate hydronephrosis.  Etiology indeterminate.  Left Kidney:  Length: 10.6 cm. No left-sided hydronephrosis. 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  Bladder:  Patient voided prior to present exam and evaluation of the bladder is limited.  IMPRESSION: Moderate right-sided hydronephrosis.  Etiology indeterminate.  No evidence of left-sided hydronephrosis.  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   Electronically Signed   By: Bridgett Larsson M.D.   On: 08/30/2013 07:44    Scheduled Meds: . clonazePAM  0.5 mg Oral QHS  . docusate sodium  100 mg Oral BID  . flecainide  100 mg Oral BID  . levothyroxine  25 mcg Oral QAC breakfast  . metoprolol  50 mg Oral BID  . pantoprazole  40 mg Oral Q0600  . simvastatin  10 mg Oral QPM  . sodium chloride  3 mL Intravenous Q12H   Continuous Infusions:       Time spent: 35 minutes    [facility] A  [facility] Pager [phone] If 7PM-7AM, please contact night-coverage at [email or web address], password [facility] 08/30/2013, 2:14 PM  LOS: 3 days

## 2013-08-30 NOTE — Consult Note (Signed)
Right spontaneous rectus sheath hematoma from over-anticoagulation  No acute surgical indication - opening the hematoma would actually be contraindicated unless the hematoma was obviously infected.  Do not try to aspirate this hematoma.  Treat symptomatically.   Hold all anticoagulation.  Mckenzie Frank. Corliss Skains, MD, [facility] Surgery  General/ Trauma Surgery  08/30/2013 2:49 PM

## 2013-08-31 ENCOUNTER — Inpatient Hospital Stay (HOSPITAL_COMMUNITY): Payer: Medicare Other

## 2013-08-31 ENCOUNTER — Encounter: Payer: Self-pay | Admitting: Family Medicine

## 2013-08-31 LAB — TYPE AND SCREEN
Unit division: 0
Unit division: 0

## 2013-08-31 LAB — CBC
HCT: 24.6 % — ABNORMAL LOW (ref 36.0–46.0)
Hemoglobin: 8.1 g/dL — ABNORMAL LOW (ref 12.0–15.0)
MCH: 28.3 pg (ref 26.0–34.0)
MCHC: 32.9 g/dL (ref 30.0–36.0)
RBC: 2.86 MIL/uL — ABNORMAL LOW (ref 3.87–5.11)
RDW: 15.7 % — ABNORMAL HIGH (ref 11.5–15.5)

## 2013-08-31 LAB — PROTIME-INR
INR: 1.33 (ref 0.00–1.49)
Prothrombin Time: 16.2 seconds — ABNORMAL HIGH (ref 11.6–15.2)

## 2013-08-31 LAB — BASIC METABOLIC PANEL
BUN: 39 mg/dL — ABNORMAL HIGH (ref 6–23)
CO2: 22 mEq/L (ref 19–32)
Chloride: 104 mEq/L (ref 96–112)
Creatinine, Ser: 1.42 mg/dL — ABNORMAL HIGH (ref 0.50–1.10)
GFR calc Af Amer: 41 mL/min — ABNORMAL LOW (ref >90)
Potassium: 4.6 mEq/L (ref 3.7–5.3)
Sodium: 138 mEq/L (ref 137–147)

## 2013-08-31 MED ORDER — FENTANYL CITRATE 0.05 MG/ML IJ SOLN
INTRAMUSCULAR | Status: DC | PRN
  Administered 2013-08-31: 50 ug via INTRAVENOUS
  Administered 2013-08-31 (×2): 25 ug via INTRAVENOUS

## 2013-08-31 MED ORDER — MIDAZOLAM HCL 2 MG/2ML IJ SOLN
INTRAMUSCULAR | Status: AC
  Filled 2013-08-31: qty 4

## 2013-08-31 MED ORDER — MIDAZOLAM HCL 2 MG/2ML IJ SOLN
INTRAMUSCULAR | Status: DC | PRN
  Administered 2013-08-31: 0.5 mg via INTRAVENOUS
  Administered 2013-08-31: 1 mg via INTRAVENOUS

## 2013-08-31 MED ORDER — ALBUTEROL SULFATE (2.5 MG/3ML) 0.083% IN NEBU: 2.5000 mg | INHALATION_SOLUTION | RESPIRATORY_TRACT | Status: DC | PRN

## 2013-08-31 MED ORDER — FENTANYL CITRATE 0.05 MG/ML IJ SOLN
INTRAMUSCULAR | Status: AC
  Filled 2013-08-31: qty 4

## 2013-08-31 MED ORDER — IOHEXOL 300 MG/ML  SOLN
50.0000 mL | Freq: Once | INTRAMUSCULAR | Status: AC | PRN
  Administered 2013-08-31: 09:00:00 30 mL

## 2013-08-31 NOTE — Progress Notes (Signed)
Patient ID: Mckenzie Frank, female   DOB: Oct 23, 1940, 72 y.o.   MRN: [id number]    Subjective: Pt just got her perc nephro tube.  She is having pain at that site, but otherwise no real abdominal pain.  Nausea waxes and wanes.    Objective: Vital signs in last 24 hours: Temp:  [97.8 F (36.6 C)-99.3 F (37.4 C)] 97.8 F (36.6 C) (12/30 1121) Pulse Rate:  [61-71] 63 (12/30 1121) Resp:  [16-24] 18 (12/30 1121) BP: (104-158)/(41-74) 130/74 mmHg (12/30 1121) SpO2:  [94 %-100 %] 96 % (12/30 1121) Weight:  [277 lb 1.9 oz (125.7 kg)] 277 lb 1.9 oz (125.7 kg) (12/30 4098) Last BM Date: 08/28/13  Intake/Output from previous day: 12/29 0701 - 12/30 0700 In: 1701.3 [P.O.:540; I.V.:1161.3] Out: 1500 [Urine:1500] Intake/Output this shift: Total I/O In: -  Out: 500 [Urine:500]  PE: Abd: soft, subq hematomas palpable, distention still present from other larger hematomas. +BS  Lab Results:   Recent Labs  08/29/13 0745 08/30/13 0928  WBC 15.2* 14.2*  HGB 7.3* 8.5*  HCT 22.1* 24.9*  PLT 445* 405*   BMET  Recent Labs  08/29/13 0745 08/30/13 0928  NA 131* 131*  K 4.6 4.6  CL 99 99  CO2 18* 18*  GLUCOSE 122* 181*  BUN 38* 48*  CREATININE 2.02* 2.14*  CALCIUM 7.9* 8.0*   PT/INR  Recent Labs  08/29/13 0745 08/30/13 1046  LABPROT 18.9* 17.4*  INR 1.63* 1.47   CMP     Component Value Date/Time   NA 131* 08/30/2013 0928   K 4.6 08/30/2013 0928   CL 99 08/30/2013 0928   CO2 18* 08/30/2013 0928   GLUCOSE 181* 08/30/2013 0928   BUN 48* 08/30/2013 0928   CREATININE 2.14* 08/30/2013 0928   CREATININE 1.14* 08/12/2013 1853   CALCIUM 8.0* 08/30/2013 0928   PROT 6.8 08/13/2013 0648   ALBUMIN 3.3* 08/13/2013 0648   AST 22 08/13/2013 0648   ALT 22 08/13/2013 0648   ALKPHOS 135* 08/13/2013 0648   BILITOT 1.4* 08/13/2013 0648   GFRNONAA 22* 08/30/2013 0928   GFRAA 25* 08/30/2013 0928   Lipase  No results found for this basename: lipase       Studies/Results: Ct  Abdomen Pelvis Wo Contrast  08/30/2013   CLINICAL DATA:  Followup evaluation for rectus sheath hematoma. Increasing abdominal pain.  EXAM: CT ABDOMEN AND PELVIS WITHOUT CONTRAST  TECHNIQUE: Multidetector CT imaging of the abdomen and pelvis was performed following the standard protocol without intravenous contrast.  COMPARISON:  CT of the abdomen and pelvis 08/27/2013.  FINDINGS: Lung Bases: Moderate hiatal hernia. Atherosclerosis, including calcified atherosclerotic plaque in the left anterior descending and right coronary arteries.  Abdomen/Pelvis: Diffusely decreased attenuation throughout the hepatic parenchyma, compatible with hepatic steatosis. Status post cholecystectomy. The unenhanced appearance of the pancreas, spleen and bilateral adrenal glands is unremarkable. 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 New mild to moderate right hydroureteronephrosis which appears to be related to extensive mass effect upon the distal 3rd of the right ureter (discussed below).  When compared to the prior examination, the amount of high attenuation material within the right side of the rectus abdominis musculature has significantly increased in volume, currently spanning 16.7 cm in length, measuring up to 8.1 x 4.5 cm transaxially. However, this hemorrhage has also dissected into the subperitoneal spaces of the right side of the anatomic pelvis, where there is a very  large extraperitoneal high attenuation fluid collection which measures up to 15.6 x 9.9 cm trans axially, extending over a length of approximately 12.3 cm craniocaudally. Notably, it is this extraperitoneal pelvic hemorrhage which exerts mass effect upon the urinary bladder displacing it into the far left side of the pelvis, and also exerting mass effect upon the distal 3rd of the right ureter near the right ureterovesicular junction, likely accounting for the right-sided  hydroureteronephrosis. No definite intraperitoneal extension of this hemorrhage is noted at this time. Some of this hemorrhage is dissecting up the right side of the retroperitoneum.  Trace volume of ascites. No pneumoperitoneum. No pathologic distention of small bowel. Atherosclerosis throughout the abdominal and pelvic vasculature. Numerous colonic diverticulae noted, without surrounding inflammatory changes to suggest an acute diverticulitis at this time. Status post hysterectomy. Ovaries are not confidently identified may be surgically absent or atrophic.  Musculoskeletal: Multifocal high attenuation foci within the subcutaneous fat of the anterior abdominal wall bilaterally, similar to the prior study, with compatible with multiple additional foci of hemorrhage. There is obscuration of the normal intervening fat planes in the right flank musculature, with surrounding soft tissue stranding in the overlying subcutaneous fat, likely to reflect edema/inflammation related to resolving hematoma. There are no aggressive appearing lytic or blastic lesions noted in the visualized portions of the skeleton.  IMPRESSION: 1. Significant interval enlargement of the previously noted hematoma in the right rectus abdominis musculature, now with extension of hemorrhage into the subperitoneal spaces in the right hemipelvis where there is a large extraperitoneal hematoma exerting significant mass effect upon the urinary bladder and distal right ureter resulting in mild to moderate right hydroureteronephrosis. 2. Multiple small hemorrhages in the subcutaneous fat of the anterior abdominal wall redemonstrated. 3. Extensive atherosclerosis, including at least 2 vessel coronary artery disease, as above. 4. Colonic diverticulosis without definite findings to suggest acute diverticulitis at this time. 5. Hepatic steatosis. 6. Postoperative changes, as above.   Electronically Signed   By: Trudie Reed M.D.   On: 08/30/2013 08:13    US Renal  08/30/2013   CLINICAL DATA:  Acute renal failure.  Hematuria.  Hypertension.  EXAM: RENAL/URINARY TRACT ULTRASOUND COMPLETE  COMPARISON:  08/27/2013 CT.  FINDINGS: Right Kidney:  Length: 11.2 cm.  Moderate hydronephrosis.  Etiology indeterminate.  Left Kidney:  Length: 10.6 cm. No left-sided hydronephrosis. 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  Bladder:  Patient voided prior to present exam and evaluation of the bladder is limited.  IMPRESSION: Moderate right-sided hydronephrosis.  Etiology indeterminate.  No evidence of left-sided hydronephrosis.  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   Electronically Signed   By: Bridgett Larsson M.D.   On: 08/30/2013 07:44    Anti-infectives: Anti-infectives   Start     Dose/Rate Route Frequency Ordered Stop   08/31/13 0000  ceFAZolin (ANCEF) IVPB 2 g/50 mL premix    Comments:  Hang ON CALL to xray 12/30   2 g 100 mL/hr over 30 Minutes Intravenous  Once 08/30/13 1628 08/31/13 0921       Assessment/Plan  1. 2 large hematomas 2. Mass effect on bladder and right ureter,  s/p perc nephro tube placement  Plan: 1. No indications for surgical intervention.  Cont current medical care.  If patient remains stable, no need for further imaging unless develops concerning symptoms such as fever, etc.  I have explained to the family that this will likely take a couple months or more to completely reabsorb and resolve.  We will sign off.  Please call us back if needed.   LOS: 4 days    Loriene Taunton E 08/31/2013, 11:49 AM Pager: [phone]

## 2013-08-31 NOTE — Progress Notes (Signed)
PT Cancellation Note  Patient Details Name: Mckenzie Frank MRN: [id number] DOB: 1940/10/29   Cancelled Treatment:    Reason Eval/Treat Not Completed: Patient at procedure or test/unavailable (currently in surgery; will f/u tomorrow)   Moshe Cipro K 08/31/2013, 8:54 AM  Clarita Crane, PT, DPT [phone]

## 2013-08-31 NOTE — Procedures (Signed)
Post-Procedure Note  Pre-operative Diagnosis: Right hydronephrosis       Post-operative Diagnosis: right hydronephrosis   Indications: right hydronephrosis related to large retroperitoneal hematoma.   Procedure Details:   Informed consent was obtained. Placed right 10 French nephrostomy with [place] and fluoro guidance.  No immediate complication.    Findings: Moderate right hydronephrosis.  Nephrostomy placed thru lower pole.    Complications: none     Condition: stable  Plan: Follow drainage.  Anticipate nephrostogram when hematoma resolves.

## 2013-08-31 NOTE — Progress Notes (Signed)
OT Cancellation Note  Patient Details Name: Mckenzie Frank MRN: [id number] DOB: May 14, 1941   Cancelled Treatment:    Reason Eval/Treat Not Completed: Patient at procedure or test/ unavailable. Pt currently in surgery, will follow up tomorrow as appropriate  Galen Manila 08/31/2013, 9:44 AM

## 2013-08-31 NOTE — Progress Notes (Signed)
Wilmon Arms. Corliss Skains, MD, [facility] Surgery  General/ Trauma Surgery  08/31/2013 12:18 PM

## 2013-08-31 NOTE — Progress Notes (Signed)
[facility] PROGRESS NOTE  CAPRI VEALS ZHY:[id number] DOB: 07/14/1941 DOA: 08/27/2013 PCP: Eustaquio Boyden, MD  HPI/Subjective: Seen before and after had the percutaneous nephrostomy tube. Minimal pain, no complaints.  Assessment/Plan: Principal Problem:   Abdominal wall hematoma Active Problems:   Atrial fibrillation   Hyperlipidemia   Hypertension   Hypothyroidism   Abnormal bruising   Coagulopathy   Nausea and vomiting in adult   ARF (acute renal failure)   Abdominal wall hematoma -Rectus sheath hematoma with multiple small subcutaneous hematomas in the anterior abdominal wall. -This is likely secondary to anticoagulation. -Patient was on Coumadin atenolol respiration, INR was 2.3, vitamin K given. INR is down to 1.4. -She will need to be off of anticoagulation for at least 2-3 weeks. -Gen. surgery consulted, recommended watching of the hematoma.  Right-sided hydronephrosis -Secondary to a hematoma, asked urology to evaluate. -Urology recommended percutaneous drain, IR placed left PCN tube earlier today.  Anemia -Acute blood loss anemia secondary to intramuscular bleeding. -Watch hemoglobin closely, might drop further with hydration. -Hemoglobin dropped to 7.3, likely secondary to the abdominal wall hematoma, transfuse 2 units of packed RBCs.  Acute renal failure -Baseline creatinine is 0.7, creatinine increased to peak of 2.1. -Had low blood pressure of 94/44 on the 26th, also had a contrasted CT scan, but above all has right-sided hydronephrosis. -Right PCN tube placed, follow creatinine closely.  Atrial fibrillation -Patient is on metoprolol and flecainide for rate control. -Anticoagulation is on hold secondary to abdominal wall hematoma. -Likely need to hold Coumadin for 3-4 weeks, can likely discharge home on 81 mg of aspirin for CVA prophylaxis.  Hypertension -On metoprolol, Norvasc and aspirin on hold, restart as appropriate.  Recently treated  sigmoid diverticulitis -CT scan of abdomen pelvis showed resolution of patient diverticulitis.  Code Status: Full code Family Communication: Plan discussed with the patient. Disposition Plan: Remains inpatient   Consultants:  None  Procedures:  None  Antibiotics:  None   Objective: Filed Vitals:   08/31/13 1157  BP: 148/69  Pulse: 62  Temp: 97.6 F (36.4 C)  Resp: 18    Intake/Output Summary (Last 24 hours) at 08/31/13 1448 Last data filed at 08/31/13 1301  Gross per 24 hour  Intake 1461.25 ml  Output   2050 ml  Net -588.75 ml   Filed Weights   08/29/13 0539 08/30/13 0404 08/31/13 0608  Weight: 118.298 kg (260 lb 12.8 oz) 124.9 kg (275 lb 5.7 oz) 125.7 kg (277 lb 1.9 oz)    Exam: General: Alert and awake, oriented x3, not in any acute distress. HEENT: anicteric sclera, pupils reactive to light and accommodation, EOMI CVS: S1-S2 clear, no murmur rubs or gallops Chest: clear to auscultation bilaterally, no wheezing, rales or rhonchi Abdomen: soft nontender, nondistended, normal bowel sounds, no organomegaly Extremities: no cyanosis, clubbing or edema noted bilaterally Neuro: Cranial nerves II-XII intact, no focal neurological deficits  Data Reviewed: Basic Metabolic Panel:  Recent Labs Lab 08/27/13 1036 08/27/13 2042 08/28/13 0540 08/29/13 0745 08/30/13 0928 08/31/13 1205  NA 136  --  133* 131* 131* 138  K 4.4  --  4.6 4.6 4.6 4.6  CL 101  --  99 99 99 104  CO2 26  --  22 18* 18* 22  GLUCOSE 96  --  125* 122* 181* 102*  BUN 17  --  23 38* 48* 39*  CREATININE 0.7  --  1.13* 2.02* 2.14* 1.42*  CALCIUM 9.0  --  7.9* 7.9* 8.0* 8.2*  MG  --  2.1  --   --   --   --    Liver Function Tests: No results found for this basename: AST, ALT, ALKPHOS, BILITOT, PROT, ALBUMIN,  in the last 168 hours No results found for this basename: LIPASE, AMYLASE,  in the last 168 hours No results found for this basename: AMMONIA,  in the last 168 hours CBC:  Recent  Labs Lab 08/27/13 1036  08/28/13 0540 08/29/13 0035 08/29/13 0745 08/30/13 0928 08/31/13 1205  WBC 10.3  < > 12.6* 16.2* 15.2* 14.2* 9.7  NEUTROABS 8.2*  --   --   --   --   --   --   HGB 11.2*  < > 8.6* 7.6* 7.3* 8.5* 8.1*  HCT 34.0*  < > 26.5* 23.0* 22.1* 24.9* 24.6*  MCV 84.7  < > 86.3 84.6 85.7 84.4 86.0  PLT 396.0  < > 427* 435* 445* 405* 395  < > = values in this interval not displayed. Cardiac Enzymes: No results found for this basename: CKTOTAL, CKMB, CKMBINDEX, TROPONINI,  in the last 168 hours BNP (last 3 results) No results found for this basename: PROBNP,  in the last 8760 hours CBG: No results found for this basename: GLUCAP,  in the last 168 hours  Micro Recent Results (from the past 240 hour(s))  URINE CULTURE     Status: None   Collection Time    08/27/13  8:07 PM      Result Value Range Status   Specimen Description URINE, RANDOM   Final   Special Requests NONE   Final   Culture  Setup Time     Final   Value: 08/28/2013 14:18     Performed at [facility] Count     Final   Value: NO GROWTH     Performed at [facility]   Culture     Final   Value: NO GROWTH     Performed at [facility]   Report Status 08/29/2013 FINAL   Final     Studies: Ct Abdomen Pelvis Wo Contrast  08/30/2013   CLINICAL DATA:  Followup evaluation for rectus sheath hematoma. Increasing abdominal pain.  EXAM: CT ABDOMEN AND PELVIS WITHOUT CONTRAST  TECHNIQUE: Multidetector CT imaging of the abdomen and pelvis was performed following the standard protocol without intravenous contrast.  COMPARISON:  CT of the abdomen and pelvis 08/27/2013.  FINDINGS: Lung Bases: Moderate hiatal hernia. Atherosclerosis, including calcified atherosclerotic plaque in the left anterior descending and right coronary arteries.  Abdomen/Pelvis: Diffusely decreased attenuation throughout the hepatic parenchyma, compatible with hepatic steatosis. Status post cholecystectomy. The  unenhanced appearance of the pancreas, spleen and bilateral adrenal glands is unremarkable. 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 New mild to moderate right hydroureteronephrosis which appears to be related to extensive mass effect upon the distal 3rd of the right ureter (discussed below).  When compared to the prior examination, the amount of high attenuation material within the right side of the rectus abdominis musculature has significantly increased in volume, currently spanning 16.7 cm in length, measuring up to 8.1 x 4.5 cm transaxially. However, this hemorrhage has also dissected into the subperitoneal spaces of the right side of the anatomic pelvis, where there is a very large extraperitoneal high attenuation fluid collection which measures up to 15.6 x 9.9 cm trans axially, extending over a length of approximately 12.3 cm craniocaudally. Notably,  it is this extraperitoneal pelvic hemorrhage which exerts mass effect upon the urinary bladder displacing it into the far left side of the pelvis, and also exerting mass effect upon the distal 3rd of the right ureter near the right ureterovesicular junction, likely accounting for the right-sided hydroureteronephrosis. No definite intraperitoneal extension of this hemorrhage is noted at this time. Some of this hemorrhage is dissecting up the right side of the retroperitoneum.  Trace volume of ascites. No pneumoperitoneum. No pathologic distention of small bowel. Atherosclerosis throughout the abdominal and pelvic vasculature. Numerous colonic diverticulae noted, without surrounding inflammatory changes to suggest an acute diverticulitis at this time. Status post hysterectomy. Ovaries are not confidently identified may be surgically absent or atrophic.  Musculoskeletal: Multifocal high attenuation foci within the subcutaneous fat of the anterior abdominal wall bilaterally, similar to the  prior study, with compatible with multiple additional foci of hemorrhage. There is obscuration of the normal intervening fat planes in the right flank musculature, with surrounding soft tissue stranding in the overlying subcutaneous fat, likely to reflect edema/inflammation related to resolving hematoma. There are no aggressive appearing lytic or blastic lesions noted in the visualized portions of the skeleton.  IMPRESSION: 1. Significant interval enlargement of the previously noted hematoma in the right rectus abdominis musculature, now with extension of hemorrhage into the subperitoneal spaces in the right hemipelvis where there is a large extraperitoneal hematoma exerting significant mass effect upon the urinary bladder and distal right ureter resulting in mild to moderate right hydroureteronephrosis. 2. Multiple small hemorrhages in the subcutaneous fat of the anterior abdominal wall redemonstrated. 3. Extensive atherosclerosis, including at least 2 vessel coronary artery disease, as above. 4. Colonic diverticulosis without definite findings to suggest acute diverticulitis at this time. 5. Hepatic steatosis. 6. Postoperative changes, as above.   Electronically Signed   By: Trudie Reed M.D.   On: 08/30/2013 08:13   US Renal  08/30/2013   CLINICAL DATA:  Acute renal failure.  Hematuria.  Hypertension.  EXAM: RENAL/URINARY TRACT ULTRASOUND COMPLETE  COMPARISON:  08/27/2013 CT.  FINDINGS: Right Kidney:  Length: 11.2 cm.  Moderate hydronephrosis.  Etiology indeterminate.  Left Kidney:  Length: 10.6 cm. No left-sided hydronephrosis. 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  Bladder:  Patient voided prior to present exam and evaluation of the bladder is limited.  IMPRESSION: Moderate right-sided hydronephrosis.  Etiology indeterminate.  No evidence of left-sided hydronephrosis.  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   Electronically Signed   By: Bridgett Larsson M.D.   On: 08/30/2013 07:44    Scheduled Meds: . clonazePAM  0.5 mg Oral QHS  . docusate sodium  100 mg Oral BID  . fentaNYL      . flecainide  100 mg Oral BID  . levothyroxine  25 mcg Oral QAC breakfast  . metoprolol  50 mg Oral BID  . midazolam      . mirabegron ER  25 mg Oral Daily  . pantoprazole  40 mg Oral Q0600  . simvastatin  10 mg Oral QPM  . sodium chloride  3 mL Intravenous Q12H   Continuous Infusions: . sodium chloride 75  mL/hr at 08/31/13 0208       Time spent: 35 minutes    [facility]  [facility] Pager [phone] If 7PM-7AM, please contact night-coverage at [email or web address], password [facility] 08/31/2013, 2:48 PM  LOS: 4 days

## 2013-09-01 DIAGNOSIS — D62 Acute posthemorrhagic anemia: Secondary | ICD-10-CM

## 2013-09-01 LAB — BASIC METABOLIC PANEL
BUN: 31 mg/dL — ABNORMAL HIGH (ref 6–23)
CO2: 21 mEq/L (ref 19–32)
Calcium: 8.2 mg/dL — ABNORMAL LOW (ref 8.4–10.5)
Chloride: 104 mEq/L (ref 96–112)
GFR calc Af Amer: 68 mL/min — ABNORMAL LOW (ref >90)
Glucose, Bld: 114 mg/dL — ABNORMAL HIGH (ref 70–99)
Potassium: 4.7 mEq/L (ref 3.7–5.3)

## 2013-09-01 LAB — CBC
HCT: 23.6 % — ABNORMAL LOW (ref 36.0–46.0)
Hemoglobin: 7.8 g/dL — ABNORMAL LOW (ref 12.0–15.0)
MCH: 28.7 pg (ref 26.0–34.0)
MCHC: 33.1 g/dL (ref 30.0–36.0)
Platelets: 415 10*3/uL — ABNORMAL HIGH (ref 150–400)
RBC: 2.72 MIL/uL — ABNORMAL LOW (ref 3.87–5.11)

## 2013-09-01 NOTE — Progress Notes (Signed)
Patient ID: Mckenzie Frank, female   DOB: 09-24-1940, 72 y.o.   MRN: [id number]  Mckenzie Frank has a right pelvic hematoma with right ureteral obstruction and bladder compression with urge incontinence.  She is feeling better with less pain since the NT was placed.   She has had good output.  She reports reduced frequency and urgency with the Myrbetriq.  Her anemia is stabilizing and her Cr has fallen.   ROS: neg for fever or nausea.  PE: Afeb, VSS        WD, WN in NAD, A/O x 3          Hgb is 7.8 which is a minimal further decline from 8.1. Cr was down to 1.42 yesterday but I believe that was prior to the right NT placement.  I have reviewed her nephrostomy films and report.  Imp: right pelvic hematoma with ureteral obstruction and pain improved with NT drainage.         Urge incontinence reduced with Myrbetriq.         Acute blood loss anemia is stabilizing.   Rec:  Continue nephrostomy tube drainage.           Continue Myrbetriq.           Repeat CT and right nephrostogram in about 2 weeks.  If the obstruction persists at that time, it would be reasonable to consider antegrade stent placement.  Bjorn Pippin MD

## 2013-09-01 NOTE — Progress Notes (Signed)
[facility] PROGRESS NOTE  Mckenzie Frank ZOX:[id number] DOB: 1940-09-11 DOA: 08/27/2013 PCP: Eustaquio Boyden, MD  HPI/Subjective: Patient states doing well this morning, she was helped out of bed to bedside chair. He is tolerating by mouth intake. Her son was present at bedside, updated on patient's condition.  Assessment/Plan: Principal Problem:   Abdominal wall hematoma Active Problems:   Atrial fibrillation   Hyperlipidemia   Hypertension   Hypothyroidism   Abnormal bruising   Coagulopathy   Nausea and vomiting in adult   ARF (acute renal failure)   Abdominal wall hematoma -Rectus sheath hematoma with multiple small subcutaneous hematomas in the anterior abdominal wall. -This is likely secondary to anticoagulation. -Patient was on Coumadin atenolol respiration, INR was 2.3, vitamin K given. INR is down to 1.4. -She will need to be off of anticoagulation for at least 2-3 weeks. -Gen. surgery consulted, conservative management  Right-sided hydronephrosis -Secondary to a hematoma, asked urology to evaluate. -Status post nephrostomy tube placement on 08/31/2013.  Anemia -Acute blood loss anemia secondary to intramuscular bleeding. -Watch hemoglobin closely, might drop further with hydration. -Hemoglobin dropped to 7.3 secondary to bleed, status post transfusion with packed red blood cells  Acute renal failure -Resolved. A.m. lab work showing a creatinine of 0.93.  Atrial fibrillation -Patient is on metoprolol and flecainide for rate control. -Anticoagulation is on hold secondary to abdominal wall hematoma. -Likely need to hold Coumadin for 3-4 weeks, can likely discharge home on 81 mg of aspirin for CVA prophylaxis.  Hypertension -On metoprolol, Norvasc and aspirin on hold, restart as appropriate.  Recently treated sigmoid diverticulitis -CT scan of abdomen pelvis showed resolution of patient diverticulitis.  Code Status: Full code Family Communication:  Plan discussed with the patient. Disposition Plan: Remains inpatient   Consultants:  None  Procedures:  None  Antibiotics:  None   Objective: Filed Vitals:   09/01/13 1453  BP: 142/57  Pulse: 77  Temp: 97.8 F (36.6 C)  Resp: 18    Intake/Output Summary (Last 24 hours) at 09/01/13 1544 Last data filed at 09/01/13 1500  Gross per 24 hour  Intake   1171 ml  Output   1060 ml  Net    111 ml   Filed Weights   08/30/13 0404 08/31/13 0608 09/01/13 0521  Weight: 124.9 kg (275 lb 5.7 oz) 125.7 kg (277 lb 1.9 oz) 128.4 kg (283 lb 1.1 oz)    Exam: General: Alert and awake, oriented x3, not in any acute distress. HEENT: anicteric sclera, pupils reactive to light and accommodation, EOMI CVS: S1-S2 clear, no murmur rubs or gallops Chest: clear to auscultation bilaterally, no wheezing, rales or rhonchi Abdomen: soft nontender, nondistended, normal bowel sounds, no organomegaly Extremities: no cyanosis, clubbing or edema noted bilaterally Neuro: Cranial nerves II-XII intact, no focal neurological deficits  Data Reviewed: Basic Metabolic Panel:  Recent Labs Lab 08/27/13 1036 08/27/13 2042 08/28/13 0540 08/29/13 0745 08/30/13 0928 08/31/13 1205 09/01/13 0514  NA 136  --  133* 131* 131* 138 137  K 4.4  --  4.6 4.6 4.6 4.6 4.7  CL 101  --  99 99 99 104 104  CO2 26  --  22 18* 18* 22 21  GLUCOSE 96  --  125* 122* 181* 102* 114*  BUN 17  --  23 38* 48* 39* 31*  CREATININE 0.7  --  1.13* 2.02* 2.14* 1.42* 0.93  CALCIUM 9.0  --  7.9* 7.9* 8.0* 8.2* 8.2*  MG  --  2.1  --   --   --   --   --  Liver Function Tests: No results found for this basename: AST, ALT, ALKPHOS, BILITOT, PROT, ALBUMIN,  in the last 168 hours No results found for this basename: LIPASE, AMYLASE,  in the last 168 hours No results found for this basename: AMMONIA,  in the last 168 hours CBC:  Recent Labs Lab 08/27/13 1036  08/29/13 0035 08/29/13 0745 08/30/13 0928 08/31/13 1205  09/01/13 0514  WBC 10.3  < > 16.2* 15.2* 14.2* 9.7 9.5  NEUTROABS 8.2*  --   --   --   --   --   --   HGB 11.2*  < > 7.6* 7.3* 8.5* 8.1* 7.8*  HCT 34.0*  < > 23.0* 22.1* 24.9* 24.6* 23.6*  MCV 84.7  < > 84.6 85.7 84.4 86.0 86.8  PLT 396.0  < > 435* 445* 405* 395 415*  < > = values in this interval not displayed. Cardiac Enzymes: No results found for this basename: CKTOTAL, CKMB, CKMBINDEX, TROPONINI,  in the last 168 hours BNP (last 3 results) No results found for this basename: PROBNP,  in the last 8760 hours CBG: No results found for this basename: GLUCAP,  in the last 168 hours  Micro Recent Results (from the past 240 hour(s))  URINE CULTURE     Status: None   Collection Time    08/27/13  8:07 PM      Result Value Range Status   Specimen Description URINE, RANDOM   Final   Special Requests NONE   Final   Culture  Setup Time     Final   Value: 08/28/2013 14:18     Performed at [facility] Count     Final   Value: NO GROWTH     Performed at [facility]   Culture     Final   Value: NO GROWTH     Performed at [facility]   Report Status 08/29/2013 FINAL   Final     Studies: Ir Perc Nephrostomy Right  08/31/2013   CLINICAL DATA:  72 year old with right hydronephrosis due to a large retroperitoneal hematoma.  EXAM: PERCUTANEOUS RIGHT NEPHROSTOMY TUBE PLACEMENT WITH ULTRASOUND AND FLUOROSCOPIC GUIDANCE  Physician: Rachelle Hora. Henn, MD  MEDICATIONS: Versed 1.5 mg, fentanyl 100 mcg. Ancef. A radiology nurse monitored the patient for moderate sedation.  ANESTHESIA/SEDATION: Moderate sedation time: 30 min  FLUOROSCOPY TIME:  5 min and 36 seconds  PROCEDURE: The procedure was explained to the patient. The risks and benefits of the procedure were discussed and the patient's questions were addressed. Informed consent was obtained from the patient. The patient was placed prone on the interventional table. Ultrasound demonstrated right hydronephrosis. The  right flank was prepped and draped in a sterile fashion. Maximal barrier sterile technique was utilized including caps, mask, sterile gowns, sterile gloves, sterile drape, hand hygiene and skin antiseptic. The skin and soft tissues were anesthetized with 1% lidocaine. A 22 gauge Chiba needle was directed into the right lower pole collecting system with ultrasound guidance. Urine was identified at the catheter hub. Small amount of contrast was injected to confirm placement in the collecting system. A 0.018 wire was advanced into the renal pelvis and proximal ureter. The tract was dilated with an Accustick dilator set. A 10 [place] multipurpose drain was reconstituted in the renal pelvis. Catheter was sutured to the skin and attached to gravity bag. Fluoroscopic and ultrasound images were taken and saved for documentation.  COMPLICATIONS: None  FINDINGS: Moderate right hydronephrosis. Catheter placement  in the renal pelvis.  IMPRESSION: Successful placement of right percutaneous nephrostomy tube with ultrasound and fluoroscopic guidance.   Electronically Signed   By: Richarda Overlie M.D.   On: 08/31/2013 16:02   Ir US Guide Bx Asp/drain  08/31/2013   CLINICAL DATA:  72 year old with right hydronephrosis due to a large retroperitoneal hematoma.  EXAM: PERCUTANEOUS RIGHT NEPHROSTOMY TUBE PLACEMENT WITH ULTRASOUND AND FLUOROSCOPIC GUIDANCE  Physician: Rachelle Hora. Henn, MD  MEDICATIONS: Versed 1.5 mg, fentanyl 100 mcg. Ancef. A radiology nurse monitored the patient for moderate sedation.  ANESTHESIA/SEDATION: Moderate sedation time: 30 min  FLUOROSCOPY TIME:  5 min and 36 seconds  PROCEDURE: The procedure was explained to the patient. The risks and benefits of the procedure were discussed and the patient's questions were addressed. Informed consent was obtained from the patient. The patient was placed prone on the interventional table. Ultrasound demonstrated right hydronephrosis. The right flank was prepped and draped in a  sterile fashion. Maximal barrier sterile technique was utilized including caps, mask, sterile gowns, sterile gloves, sterile drape, hand hygiene and skin antiseptic. The skin and soft tissues were anesthetized with 1% lidocaine. A 22 gauge Chiba needle was directed into the right lower pole collecting system with ultrasound guidance. Urine was identified at the catheter hub. Small amount of contrast was injected to confirm placement in the collecting system. A 0.018 wire was advanced into the renal pelvis and proximal ureter. The tract was dilated with an Accustick dilator set. A 10 [place] multipurpose drain was reconstituted in the renal pelvis. Catheter was sutured to the skin and attached to gravity bag. Fluoroscopic and ultrasound images were taken and saved for documentation.  COMPLICATIONS: None  FINDINGS: Moderate right hydronephrosis. Catheter placement in the renal pelvis.  IMPRESSION: Successful placement of right percutaneous nephrostomy tube with ultrasound and fluoroscopic guidance.   Electronically Signed   By: Richarda Overlie M.D.   On: 08/31/2013 16:02    Scheduled Meds: . clonazePAM  0.5 mg Oral QHS  . docusate sodium  100 mg Oral BID  . flecainide  100 mg Oral BID  . levothyroxine  25 mcg Oral QAC breakfast  . metoprolol  50 mg Oral BID  . mirabegron ER  25 mg Oral Daily  . pantoprazole  40 mg Oral Q0600  . simvastatin  10 mg Oral QPM  . sodium chloride  3 mL Intravenous Q12H   Continuous Infusions:       Time spent: 35 minutes    Jeralyn Bennett  [facility] Pager [phone] If 7PM-7AM, please contact night-coverage at [email or web address], password [facility] 09/01/2013, 3:44 PM  LOS: 5 days

## 2013-09-01 NOTE — Progress Notes (Signed)
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 Patient was recently discharged from the hospital on 08/17/2013 after being treated for an acute sigmoid diverticulitis with possible microperforation and abscess formation.   PT Comments   Pt much improved mobility today, though continue to be limited by fatigue and pain around incision.  Son asking about CIR stay at D/C and feel pt could benefit from CIR.  Will continue to follow.    Follow Up Recommendations  CIR     Does the patient have the potential to tolerate intense rehabilitation     Barriers to Discharge        Equipment Recommendations  Rolling walker with 5" wheels    Recommendations for Other Services Rehab consult  Frequency Min 3X/week   Progress towards PT Goals Progress towards PT goals: Progressing toward goals  Plan Current plan remains appropriate;Discharge plan needs to be updated    Precautions / Restrictions Precautions Precautions: Fall Restrictions Weight Bearing Restrictions: No   Pertinent Vitals/Pain Indicates pain 9/10 after ambulating.  RN made aware.      Mobility  Bed Mobility Bed Mobility: Not assessed Transfers Transfers: Sit to Stand;Stand to Sit Sit to Stand: 4: Min assist;With upper extremity assist;From chair/3-in-1;From toilet Stand to Sit: 4: Min guard;With upper extremity assist;To toilet;To chair/3-in-1 Details for Transfer Assistance: cues for  UE use and scooting to edge of chair prior to sitting.   Ambulation/Gait Ambulation/Gait Assistance: 4: Min guard Ambulation Distance (Feet): 140 Feet Assistive device: Rolling walker Ambulation/Gait Assistance Details: pt moves slowly and required 3 standing rest breaks during ambulation.  pt relies heavily on RW.   Gait Pattern: Step-through pattern;Decreased stride length;Trunk flexed Stairs: No Wheelchair Mobility Wheelchair Mobility: No    Exercises     PT Diagnosis:    PT Problem List:   PT Treatment Interventions:     PT Goals (current goals can now be found in the care plan section) Acute Rehab PT Goals Patient Stated Goal: To return home and feel better Time For Goal Achievement: 09/05/13 Potential to Achieve Goals: Good  Visit Information  Last PT Received On: 09/01/13 Assistance Needed: +1 History of Present Illness:  Pt is 72 y/o female with history of hypertension, hypothyroidism, hyperlipidemia, 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 Patient was recently discharged from the hospital on 08/17/2013 after being treated for an acute sigmoid diverticulitis with possible microperforation and abscess formation.    Subjective Data  Patient Stated Goal: To return home and feel better   Cognition  Cognition Arousal/Alertness: Awake/alert Behavior During Therapy: WFL for tasks assessed/performed Overall Cognitive Status: Within Functional Limits for tasks assessed    Balance  Balance Balance Assessed: No  End of Session PT - End of Session Equipment Utilized During Treatment: Gait belt Activity Tolerance: Patient limited by fatigue Patient left: in chair;with call bell/phone within reach;with family/visitor present Nurse Communication:  Mobility status   GP     Mckenzie Frank, [place] [phone] 09/01/2013, 11:12 AM

## 2013-09-01 NOTE — Progress Notes (Signed)
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 Patient was recently discharged from the hospital on 08/17/2013 after being treated for an acute sigmoid diverticulitis with possible microperforation and abscess formation.   Clinical Impression   Pt making steady progress. Mobility for ADL @ S level with use of RW. Will benefit from use of AE for LB ADL to increase safety and independence. Feel pt is able to D/C home with HHOT and initial 24/7 S. Pt will benefit from skilled OT services to facilitate D/C home due to below deficits. Will educate on AE and theraband level 2 exercises.    OT Assessment  Patient needs continued OT Services    Follow Up Recommendations  Home health OT;Supervision/Assistance - 24 hour (initial 24/7 S)    Barriers to Discharge      Equipment Recommendations  Tub/shower bench    Recommendations for Other Services    Frequency  Min 2X/week    Precautions / Restrictions Precautions Precautions: Fall (drain) Restrictions Weight Bearing Restrictions: No   Pertinent Vitals/Pain no apparent distress     ADL  Grooming: Modified independent Where Assessed - Grooming: Unsupported standing Upper Body Bathing: Set up Where Assessed - Upper Body Bathing: Unsupported sitting Lower Body Bathing: Minimal assistance Where Assessed - Lower Body Bathing: Unsupported sit to stand Upper Body Dressing: Set up Where Assessed -  Upper Body Dressing: Unsupported sitting Lower Body Dressing: Moderate assistance Where Assessed - Lower Body Dressing: Unsupported sit to stand Toilet Transfer: Min Pension scheme manager Method: Other (comment) (ambulating) Acupuncturist: Comfort height toilet;Grab bars Toileting - Clothing Manipulation and Hygiene: Modified independent Where Assessed - Toileting Clothing Manipulation and Hygiene: Sit to stand from 3-in-1 or toilet Equipment Used: Rolling walker Transfers/Ambulation Related to ADLs: S ADL Comments: would benefit from AER    OT Diagnosis: Generalized weakness;Acute pain  OT Problem List: Decreased strength;Decreased activity tolerance;Decreased knowledge of use of DME or AE;Pain;Obesity OT Treatment Interventions: Self-care/ADL training;Therapeutic exercise;Energy conservation;DME and/or AE instruction;Therapeutic activities;Patient/family education   OT Goals(Current goals can be found in the care plan section) Acute Rehab OT Goals Patient Stated Goal: To return home and feel better OT Goal Formulation: With patient Time For Goal Achievement: 09/15/12 Potential to Achieve Goals: Good  Visit Information  Last OT Received On: 09/01/13 Assistance Needed: +1 History of Present Illness:  Pt is 72 y/o female with history of hypertension, hypothyroidism, hyperlipidemia, 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 Patient was recently discharged from the hospital on 08/17/2013 after being treated for an acute sigmoid diverticulitis with possible microperforation and abscess formation.       Prior Functioning     Home Living Family/patient expects to be discharged to:: Private residence Living Arrangements: Alone Available Help at  Discharge: Family;Available 24 hours/day Type of Home: Apartment Home Access: Level entry Home Layout: One level Home Equipment: Grab bars - tub/shower Additional  Comments: Pt may stay with dtr at d/c if needed but would prefer to d/c home Prior Function Level of Independence: Independent Communication Communication: No difficulties Dominant Hand: Right         Vision/Perception     Cognition  Cognition Arousal/Alertness: Awake/alert Behavior During Therapy: WFL for tasks assessed/performed Overall Cognitive Status: Within Functional Limits for tasks assessed    Extremity/Trunk Assessment Upper Extremity Assessment Upper Extremity Assessment: Overall WFL for tasks assessed Lower Extremity Assessment Lower Extremity Assessment: Defer to PT evaluation Cervical / Trunk Assessment Cervical / Trunk Assessment: Normal     Mobility Bed Mobility Bed Mobility: Supine to Sit Supine to Sit: 5: Supervision;HOB flat Sitting - Scoot to Edge of Bed: 5: Supervision;With rail Transfers Transfers: Sit to Stand;Stand to Sit Sit to Stand: 5: Supervision Stand to Sit: 5: Supervision Details for Transfer Assistance: cues for UE use and scooting to edge of chair prior to sitting.       Exercise     Balance Balance Balance Assessed:  (WFL for ADL)   End of Session OT - End of Session Equipment Utilized During Treatment: Rolling walker Activity Tolerance: Patient tolerated treatment well Patient left: in bed;with call bell/phone within reach Nurse Communication: Mobility status  GO     Mckenzie Frank,Mckenzie Frank 09/01/2013, 2:21 PM [facility], OTR/L  [phone] 09/01/2013

## 2013-09-01 NOTE — Progress Notes (Signed)
Rehab Admissions Coordinator Note:  Patient was screened by Trish Mage for appropriateness for an Inpatient Acute Rehab Consult.  At this time, we are recommending HH or could consider SNF if needed.  Doing too well for acute inpatient rehab admission.  Lelon Frohlich M 09/01/2013, 2:18 PM  I can be reached at [phone].

## 2013-09-01 NOTE — Progress Notes (Signed)
Subjective: R Perc nephrostomy placed 12/30 Pt better today  Objective: Vital signs in last 24 hours: Temp:  [97.6 F (36.4 C)-98.9 F (37.2 C)] 98.1 F (36.7 C) (12/31 0519) Pulse Rate:  [62-75] 65 (12/31 0519) Resp:  [18] 18 (12/31 0519) BP: (138-148)/(67-69) 138/68 mmHg (12/31 0519) SpO2:  [92 %-98 %] 95 % (12/31 0519) Weight:  [283 lb 1.1 oz (128.4 kg)] 283 lb 1.1 oz (128.4 kg) (12/31 0521) Last BM Date: 08/28/13  Intake/Output from previous day: 12/30 0701 - 12/31 0700 In: 708 [P.O.:708] Out: 1490 [Urine:1490] Intake/Output this shift: Total I/O In: 463 [P.O.:460; I.V.:3] Out: 370 [Urine:370]  PE:  Vss; afeb R PCN in place NT; no bleeding Site clean and dry Output 1 Liter last pm 150 cc in bag now- dark yellow  BUN/CR decreased  Lab Results:   Recent Labs  08/31/13 1205 09/01/13 0514  WBC 9.7 9.5  HGB 8.1* 7.8*  HCT 24.6* 23.6*  PLT 395 415*   BMET  Recent Labs  08/31/13 1205 09/01/13 0514  NA 138 137  K 4.6 4.7  CL 104 104  CO2 22 21  GLUCOSE 102* 114*  BUN 39* 31*  CREATININE 1.42* 0.93  CALCIUM 8.2* 8.2*   PT/INR  Recent Labs  08/30/13 1046 08/31/13 1205  LABPROT 17.4* 16.2*  INR 1.47 1.33   ABG No results found for this basename: PHART, PCO2, PO2, HCO3,  in the last 72 hours  Studies/Results: Ir Perc Nephrostomy Right  08/31/2013   CLINICAL DATA:  72 year old with right hydronephrosis due to a large retroperitoneal hematoma.  EXAM: PERCUTANEOUS RIGHT NEPHROSTOMY TUBE PLACEMENT WITH ULTRASOUND AND FLUOROSCOPIC GUIDANCE  Physician: Rachelle Hora. Henn, MD  MEDICATIONS: Versed 1.5 mg, fentanyl 100 mcg. Ancef. A radiology nurse monitored the patient for moderate sedation.  ANESTHESIA/SEDATION: Moderate sedation time: 30 min  FLUOROSCOPY TIME:  5 min and 36 seconds  PROCEDURE: The procedure was explained to the patient. The risks and benefits of the procedure were discussed and the patient's questions were addressed. Informed consent was  obtained from the patient. The patient was placed prone on the interventional table. Ultrasound demonstrated right hydronephrosis. The right flank was prepped and draped in a sterile fashion. Maximal barrier sterile technique was utilized including caps, mask, sterile gowns, sterile gloves, sterile drape, hand hygiene and skin antiseptic. The skin and soft tissues were anesthetized with 1% lidocaine. A 22 gauge Chiba needle was directed into the right lower pole collecting system with ultrasound guidance. Urine was identified at the catheter hub. Small amount of contrast was injected to confirm placement in the collecting system. A 0.018 wire was advanced into the renal pelvis and proximal ureter. The tract was dilated with an Accustick dilator set. A 10 [place] multipurpose drain was reconstituted in the renal pelvis. Catheter was sutured to the skin and attached to gravity bag. Fluoroscopic and ultrasound images were taken and saved for documentation.  COMPLICATIONS: None  FINDINGS: Moderate right hydronephrosis. Catheter placement in the renal pelvis.  IMPRESSION: Successful placement of right percutaneous nephrostomy tube with ultrasound and fluoroscopic guidance.   Electronically Signed   By: Richarda Overlie M.D.   On: 08/31/2013 16:02   Ir US Guide Bx Asp/drain  08/31/2013   CLINICAL DATA:  72 year old with right hydronephrosis due to a large retroperitoneal hematoma.  EXAM: PERCUTANEOUS RIGHT NEPHROSTOMY TUBE PLACEMENT WITH ULTRASOUND AND FLUOROSCOPIC GUIDANCE  Physician: Rachelle Hora. Henn, MD  MEDICATIONS: Versed 1.5 mg, fentanyl 100 mcg. Ancef. A radiology nurse monitored the patient  for moderate sedation.  ANESTHESIA/SEDATION: Moderate sedation time: 30 min  FLUOROSCOPY TIME:  5 min and 36 seconds  PROCEDURE: The procedure was explained to the patient. The risks and benefits of the procedure were discussed and the patient's questions were addressed. Informed consent was obtained from the patient. The patient was  placed prone on the interventional table. Ultrasound demonstrated right hydronephrosis. The right flank was prepped and draped in a sterile fashion. Maximal barrier sterile technique was utilized including caps, mask, sterile gowns, sterile gloves, sterile drape, hand hygiene and skin antiseptic. The skin and soft tissues were anesthetized with 1% lidocaine. A 22 gauge Chiba needle was directed into the right lower pole collecting system with ultrasound guidance. Urine was identified at the catheter hub. Small amount of contrast was injected to confirm placement in the collecting system. A 0.018 wire was advanced into the renal pelvis and proximal ureter. The tract was dilated with an Accustick dilator set. A 10 [place] multipurpose drain was reconstituted in the renal pelvis. Catheter was sutured to the skin and attached to gravity bag. Fluoroscopic and ultrasound images were taken and saved for documentation.  COMPLICATIONS: None  FINDINGS: Moderate right hydronephrosis. Catheter placement in the renal pelvis.  IMPRESSION: Successful placement of right percutaneous nephrostomy tube with ultrasound and fluoroscopic guidance.   Electronically Signed   By: Richarda Overlie M.D.   On: 08/31/2013 16:02    Anti-infectives: Anti-infectives   Start     Dose/Rate Route Frequency Ordered Stop   08/31/13 0000  ceFAZolin (ANCEF) IVPB 2 g/50 mL premix    Comments:  Hang ON CALL to xray 12/30   2 g 100 mL/hr over 30 Minutes Intravenous  Once 08/30/13 1628 08/31/13 1610      Assessment/Plan: s/p * No surgery found *  R PCN intact Will follow Plan per TRH/Uro   LOS: 5 days    Jiali Linney A 09/01/2013

## 2013-09-02 LAB — BASIC METABOLIC PANEL
BUN: 24 mg/dL — ABNORMAL HIGH (ref 6–23)
CO2: 22 mEq/L (ref 19–32)
Calcium: 8.5 mg/dL (ref 8.4–10.5)
Chloride: 103 mEq/L (ref 96–112)
Creatinine, Ser: 0.85 mg/dL (ref 0.50–1.10)
GFR calc Af Amer: 76 mL/min — ABNORMAL LOW (ref >90)
GFR calc non Af Amer: 66 mL/min — ABNORMAL LOW (ref >90)
Glucose, Bld: 121 mg/dL — ABNORMAL HIGH (ref 70–99)
Potassium: 4.6 mEq/L (ref 3.7–5.3)
Sodium: 137 mEq/L (ref 137–147)

## 2013-09-02 LAB — CBC
HCT: 24.5 % — ABNORMAL LOW (ref 36.0–46.0)
Hemoglobin: 7.9 g/dL — ABNORMAL LOW (ref 12.0–15.0)
MCH: 28.2 pg (ref 26.0–34.0)
MCHC: 32.2 g/dL (ref 30.0–36.0)
MCV: 87.5 fL (ref 78.0–100.0)
Platelets: 428 10*3/uL — ABNORMAL HIGH (ref 150–400)
RBC: 2.8 MIL/uL — ABNORMAL LOW (ref 3.87–5.11)
RDW: 16.3 % — ABNORMAL HIGH (ref 11.5–15.5)
WBC: 9 10*3/uL (ref 4.0–10.5)

## 2013-09-02 MED ORDER — OXYCODONE HCL 5 MG PO TABS: 5.0000 mg | ORAL_TABLET | Freq: Four times a day (QID) | ORAL | Status: DC | PRN

## 2013-09-02 MED ORDER — ASPIRIN 81 MG PO CHEW: 81.0000 mg | CHEWABLE_TABLET | Freq: Every day | ORAL | Status: DC

## 2013-09-02 MED ORDER — ASPIRIN 81 MG PO CHEW
81.0000 mg | CHEWABLE_TABLET | Freq: Every day | ORAL | Status: DC
  Administered 2013-09-02: 12:00:00 81 mg via ORAL
  Filled 2013-09-02: qty 1

## 2013-09-02 NOTE — Discharge Summary (Signed)
Physician Discharge Summary  Mckenzie Frank XLK:[id number] DOB: 1940/10/21 DOA: 08/27/2013  PCP: Ria Bush, MD  Admit date: 08/27/2013 Discharge date: 09/02/2013  Time spent: 35 minutes  Recommendations for Outpatient Follow-up:  1. Please followup on CBC and BMP on hospital followup visit. Patient treated for acute kidney injury in setting of postobstructive nephropathy in setting of abdominal hematoma.  Discharge Diagnoses:  Principal Problem:   Abdominal wall hematoma Active Problems:   Atrial fibrillation   Hyperlipidemia   Hypertension   Hypothyroidism   Abnormal bruising   Coagulopathy   Nausea and vomiting in adult   ARF (acute renal failure)   Discharge Condition: Stable/improved  Diet recommendation: Heart healthy  Filed Weights   08/31/13 0608 09/01/13 0521 09/02/13 0550  Weight: 125.7 kg (277 lb 1.9 oz) 128.4 kg (283 lb 1.1 oz) 129.411 kg (285 lb 4.8 oz)    History of present illness:  ADRIEANA Frank is a 73 y.o. 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 Patient was recently discharged from the hospital on 08/17/2013 after being treated for an acute sigmoid diverticulitis with possible microperforation and abscess formation.  Patient had presented to her PCPs office for hospital followup and administer course of oral antibiotics. Patient was complaining of right lower quadrant sharp stabbing pain which started 2 days prior to admission and had gotten to the point she was unable to sit up straight. Patient also with complaints of upper left upper extremity bruising. CT scan was ordered and after drinking the oral contrast patient had episodes of nausea and  emesis as well as some diarrhea. Patient denies any fever, no chills, no chest pain, no shortness of breath, no cough, no constipation, no dysuria, no weakness.  Patient was seen in the ED and CT of the abdomen and pelvis obtained showed a new right rectus sheath hematoma with extension of a small amount of hemorrhage into the prevesical space, multiple subcutaneous hematomas, moderate sized hiatal hernia, sigmoid diverticulosis with resolution of sigmoid diverticulitis. Labs are tender patient's PCPs office had a normal basic metabolic profile. CBC had a hemoglobin of 11.2 otherwise was within normal limits. INR obtained was 2.4. Patient had been on a Lovenox bridge as well as Coumadin since her prior discharge.  We were called to admit the patient for further evaluation and management.  Hospital Course:  Patient is a pleasant 73 year old female with a past medical history of atrial fibrillation, had been on chronic anticoagulation with Coumadin, who presented to the emergency department on 08/27/2013 with complaints of abdominal pain. As part of initial workup she had a CT scan of abdomen and pelvis performed on 08/27/2013 that showed a right rectus sheath hematoma with extension of a small amount of hemorrhage into the pre-vesicle space. She was admitted to the medicine service for further evaluation and treatment. Initial lab work showed an INR 2.4 which was reversed and discontinued on admission. Her H&H was followed as there was a downward trend for which she was administered 2 units of packed red blood cells. A repeat CT scan of abdomen and pelvis performed on 08/30/2013 showed significant interval enlargement of the previously noted hematoma in the right rectus abdominis musculature. General surgery was consulted, with no acute surgical indication. It was felt that opening hematoma  would be contraindicated unless there was evidence of infection. Imaging studies also showed the development of right-sided  hydronephrosis thought to be an obstructive effect from hematoma. This correlated with the upward trend in her creatinine, peaking at 2.14 on 08/30/2013.  Dr. Shirlyn Goltz of urology was consulted. On 08/30/2013 she underwent percutaneous nephrostomy tube placement by interventional radiology, tolerated procedure well there were no immediate complications. Patient showed clinical improvement thereafter, with resolution to abdominal pain. Her H&H remained stable. Patient was ambulating down the hallway with physical therapy. Plan to discharge patient home with home health services. She will be started on aspirin 81 mg by mouth daily, with discontinuation of anticoagulation.   Procedures:  Percutaneous nephrostomy tube placement performed on 08/30/2013  Consultations:  General surgery  Urology  Social work  Physical therapy  Discharge Exam: Filed Vitals:   09/02/13 1029  BP: 147/52  Pulse: 69  Temp:   Resp:     General: Patient is in no acute distress, reports doing well, tolerating by mouth intake denies abdominal pain. Cardiovascular: Regular rate rhythm normal S1-S2 Respiratory: Lungs are clear to auscultation bilaterally Abdomen: Soft nontender nondistended positive bowel sounds  Discharge Instructions  Discharge Orders   Future Appointments Provider Department Dept Phone   09/07/2013 11:45 AM Adin Hector, MD [facility] [place] [phone]   09/13/2013 11:45 AM [facility] [place]   Future Orders Complete By Expires   Call MD for:  difficulty breathing, headache or visual disturbances  As directed    Call MD for:  extreme fatigue  As directed    Call MD for:  persistant dizziness or light-headedness  As directed    Call MD for:  persistant nausea and vomiting  As directed    Call MD for:  redness, tenderness, or signs of infection (pain, swelling, redness, odor or green/yellow discharge around incision site)  As  directed    Call MD for:  severe uncontrolled pain  As directed    Call MD for:  temperature >100.4  As directed    Diet - low sodium heart healthy  As directed    Discharge instructions  As directed    Comments:     Please stop taking coumadin   Increase activity slowly  As directed        Medication List    STOP taking these medications       amoxicillin-clavulanate 875-125 MG per tablet  Commonly known as:  AUGMENTIN     furosemide 40 MG tablet  Commonly known as:  LASIX     warfarin 5 MG tablet  Commonly known as:  COUMADIN      TAKE these medications       amLODipine-benazepril 10-40 MG per capsule  Commonly known as:  LOTREL  Take 1 capsule by mouth daily.     aspirin 81 MG chewable tablet  Chew 1 tablet (81 mg total) by mouth daily.     clonazePAM 0.5 MG tablet  Commonly known as:  KLONOPIN  TAKE 1 TABLET BY MOUTH EVERY NIGHT AT BEDTIME     flecainide 100 MG tablet  Commonly known as:  TAMBOCOR  Take 1 tablet (100 mg total) by mouth 2 (two) times daily.     lansoprazole 15 MG capsule  Commonly known as:  PREVACID  Take 1 capsule (15 mg total) by mouth daily.     levothyroxine 25 MCG tablet  Commonly known as:  SYNTHROID, LEVOTHROID  Take  1 tablet (25 mcg total) by mouth daily.     metoprolol 50 MG tablet  Commonly known as:  LOPRESSOR  Take 1 tablet (50 mg total) by mouth 2 (two) times daily.     oxyCODONE 5 MG immediate release tablet  Commonly known as:  Oxy IR/ROXICODONE  Take 1 tablet (5 mg total) by mouth every 6 (six) hours as needed for severe pain.     simvastatin 20 MG tablet  Commonly known as:  ZOCOR  Take 0.5 tablets (10 mg total) by mouth every evening.       Allergies  Allergen Reactions  . Clindamycin/Lincomycin     unknown  . Codeine     unknown  . Doxycycline     unknown  . Paxil [Paroxetine Hcl]     Shaking   . Promethazine     unknown  . Sulfa Antibiotics     Hives   . Valium     unknown       Follow-up  Information   Follow up with Ria Bush, MD In 1 week.   Specialty:  Family Medicine   Contact information:   [place] [address] [phone]       Follow up with Malka So, MD. Schedule an appointment as soon as possible for a visit in 10 days.   Specialty:  Urology   Contact information:   [facility]  [place] [address] [phone]        The results of significant diagnostics from this hospitalization (including imaging, microbiology, ancillary and laboratory) are listed below for reference.    Significant Diagnostic Studies: Ct Abdomen Pelvis Wo Contrast  08/30/2013   CLINICAL DATA:  Followup evaluation for rectus sheath hematoma. Increasing abdominal pain.  EXAM: CT ABDOMEN AND PELVIS WITHOUT CONTRAST  TECHNIQUE: Multidetector CT imaging of the abdomen and pelvis was performed following the standard protocol without intravenous contrast.  COMPARISON:  CT of the abdomen and pelvis 08/27/2013.  FINDINGS: Lung Bases: Moderate hiatal hernia. Atherosclerosis, including calcified atherosclerotic plaque in the left anterior descending and right coronary arteries.  Abdomen/Pelvis: Diffusely decreased attenuation throughout the hepatic parenchyma, compatible with hepatic steatosis. Status post cholecystectomy. The unenhanced appearance of the pancreas, spleen and bilateral adrenal glands is unremarkable. 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 New mild to moderate right hydroureteronephrosis which appears to be related to extensive mass effect upon the distal 3rd of the right ureter (discussed below).  When compared to the prior examination, the amount of high attenuation material within the right side of the rectus abdominis musculature has significantly increased in volume, currently spanning 16.7 cm in length, measuring up to 8.1 x 4.5 cm  transaxially. However, this hemorrhage has also dissected into the subperitoneal spaces of the right side of the anatomic pelvis, where there is a very large extraperitoneal high attenuation fluid collection which measures up to 15.6 x 9.9 cm trans axially, extending over a length of approximately 12.3 cm craniocaudally. Notably, it is this extraperitoneal pelvic hemorrhage which exerts mass effect upon the urinary bladder displacing it into the far left side of the pelvis, and also exerting mass effect upon the distal 3rd of the right ureter near the right ureterovesicular junction, likely accounting for the right-sided hydroureteronephrosis. No definite intraperitoneal extension of this hemorrhage is noted at this time. Some of this hemorrhage is dissecting up the  right side of the retroperitoneum.  Trace volume of ascites. No pneumoperitoneum. No pathologic distention of small bowel. Atherosclerosis throughout the abdominal and pelvic vasculature. Numerous colonic diverticulae noted, without surrounding inflammatory changes to suggest an acute diverticulitis at this time. Status post hysterectomy. Ovaries are not confidently identified may be surgically absent or atrophic.  Musculoskeletal: Multifocal high attenuation foci within the subcutaneous fat of the anterior abdominal wall bilaterally, similar to the prior study, with compatible with multiple additional foci of hemorrhage. There is obscuration of the normal intervening fat planes in the right flank musculature, with surrounding soft tissue stranding in the overlying subcutaneous fat, likely to reflect edema/inflammation related to resolving hematoma. There are no aggressive appearing lytic or blastic lesions noted in the visualized portions of the skeleton.  IMPRESSION: 1. Significant interval enlargement of the previously noted hematoma in the right rectus abdominis musculature, now with extension of hemorrhage into the subperitoneal spaces in the right  hemipelvis where there is a large extraperitoneal hematoma exerting significant mass effect upon the urinary bladder and distal right ureter resulting in mild to moderate right hydroureteronephrosis. 2. Multiple small hemorrhages in the subcutaneous fat of the anterior abdominal wall redemonstrated. 3. Extensive atherosclerosis, including at least 2 vessel coronary artery disease, as above. 4. Colonic diverticulosis without definite findings to suggest acute diverticulitis at this time. 5. Hepatic steatosis. 6. Postoperative changes, as above.   Electronically Signed   By: Vinnie Langton M.D.   On: 08/30/2013 08:13   Ct Abdomen Pelvis Wo Contrast  08/27/2013   CLINICAL DATA:  Mid abdominal right lower quadrant pain, bruising, sigmoid diverticulitis, on anticoagulant therapy  EXAM: CT ABDOMEN AND PELVIS WITHOUT CONTRAST  TECHNIQUE: Multidetector CT imaging of the abdomen and pelvis was performed following the standard protocol without intravenous contrast. Sagittal and coronal MPR images reconstructed from axial data set. Dilute oral contrast was administered for this exam.  COMPARISON:  08/12/2013  FINDINGS: Lung bases clear.  Gallbladder surgically absent.  Moderate-sized hiatal hernia.  Within limits of a nonenhanced exam no focal abnormalities of the liver, spleen, pancreas, kidneys, or adrenal glands.  Scattered atherosclerotic calcifications.  New right rectus sheath hematoma, 8.6 x 4.4 cm image 59 at least 12.4 cm length.  Multiple subcutaneous nodular opacities are identified likely small subcutaneous hematomas in the anterior abdominal wall bilaterally greater on right, also new.  Stranding of subcutaneous fat at the lateral aspect of the mid to inferior right abdominal wall likely related to above hemorrhages.  Small amount of high attenuation blood seen within the prevesical space likely from leak of the adjacent right rectus sheath hematoma.  No additional intra-abdominal, intra pelvic, or  retroperitoneal hemorrhage.  Sigmoid diverticulosis without evidence of diverticulitis.  Bladder contains minimal urine.  Stomach and bowel loops otherwise normal appearance.  Appendix not visualized.  No mass, adenopathy, or free fluid.  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  IMPRESSION: Right rectus sheath hematoma with extension of a small amount of hemorrhage into the prevesical space.  Multiple subcutaneous hematomas.  Moderate-sized hiatal hernia.  Sigmoid diverticulosis with resolution of the sigmoid diverticulitis changes seen on the previous exam.   Electronically Signed   By: Lavonia Dana M.D.   On: 08/27/2013 14:29   Ct Abdomen Pelvis W Contrast  08/12/2013   CLINICAL DATA:  Lower abdominal pain. Previous hysterectomy, appendectomy, cholecystectomy.  EXAM: CT ABDOMEN AND PELVIS WITH CONTRAST  TECHNIQUE: Multidetector CT imaging of the abdomen and pelvis was performed using the  standard protocol following bolus administration of intravenous contrast.  CONTRAST:  161mL OMNIPAQUE IOHEXOL 300 MG/ML  SOLN  COMPARISON:  CT of the abdomen and pelvis on 06/24/2009  FINDINGS: Lung bases are negative. There is mild intra and extrahepatic biliary ductal dilatation following cholecystectomy, a normal variant. No focal lesions identified within the liver, spleen, pancreas, adrenal glands, or kidneys.  Note is made of a hiatal hernia. Otherwise, the stomach has a normal appearance. Small bowel loops are normal in caliber. The appendix is surgically absent. The sigmoid colon is notable for significant mural thickening and inflammatory change. Numerous colonic diverticula are identified within this segment. Findings are consistent with acute diverticulitis. Just superior to the sigmoid segment, there is an irregular collection of fluid and air, measuring 2.1 x 2.0 cm, consistent with early abscess. There are locules of gas adjacent to the sigmoid colon, consistent with micro  perforation. There is significant mesenteric fluid. Fluid abuts the left ovary which contains a coarse calcification, unchanged in appearance and measuring 1.9 cm. This could represent a small ovarian dermoid. The right ovary has a normal appearance. The uterus is surgically absent.  There are significant degenerative changes throughout the lower thoracic and lumbar spine. No suspicious lytic or blastic lesions are identified.  IMPRESSION: 1. Significant changes of sigmoid diverticulitis. 2. Small area of micro perforation and possible early abscess just superior to the sigmoid segment, measuring 2.1 cm in diameter. 3. 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 4. Status post cholecystectomy, hysterectomy. 5. The findings were discussed with Benjiman Core, M.D. on 08/12/2013 at 11:14 PM. 6. I discussed the findings with the patient and her daughter. She will be evaluated in the emergency department.   Electronically Signed   By: Shon Hale M.D.   On: 08/12/2013 23:37   US Renal  08/30/2013   CLINICAL DATA:  Acute renal failure.  Hematuria.  Hypertension.  EXAM: RENAL/URINARY TRACT ULTRASOUND COMPLETE  COMPARISON:  08/27/2013 CT.  FINDINGS: Right Kidney:  Length: 11.2 cm.  Moderate hydronephrosis.  Etiology indeterminate.  Left Kidney:  Length: 10.6 cm. No left-sided hydronephrosis. 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  Bladder:  Patient voided prior to present exam and evaluation of the bladder is limited.  IMPRESSION: Moderate right-sided hydronephrosis.  Etiology indeterminate.  No evidence of left-sided hydronephrosis.  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   Electronically Signed   By: Chauncey Cruel M.D.   On: 08/30/2013 07:44   Ir Perc Nephrostomy Right  08/31/2013   CLINICAL DATA:  73 year old with right hydronephrosis due to a large retroperitoneal hematoma.  EXAM: PERCUTANEOUS RIGHT NEPHROSTOMY TUBE PLACEMENT WITH ULTRASOUND AND FLUOROSCOPIC GUIDANCE  Physician: Stephan Minister. Henn, MD  MEDICATIONS: Versed 1.5 mg, fentanyl 100 mcg. Ancef. A radiology nurse monitored the patient for moderate sedation.  ANESTHESIA/SEDATION: Moderate sedation time: 30 min  FLUOROSCOPY TIME:  5 min and 36 seconds  PROCEDURE: The procedure was explained to the patient. The risks and benefits of the procedure were discussed and the patient's questions were addressed. Informed consent was obtained from the patient. The patient was placed prone on the interventional table. Ultrasound  demonstrated right hydronephrosis. The right flank was prepped and draped in a sterile fashion. Maximal barrier sterile technique was utilized including caps, mask, sterile gowns, sterile gloves, sterile drape, hand hygiene and skin antiseptic. The skin and soft tissues were anesthetized with 1% lidocaine. A 22 gauge Chiba needle was directed into the right lower pole collecting system with ultrasound guidance. Urine was identified at the catheter hub. Small amount of contrast was injected to confirm placement in the collecting system. A 0.018 wire was advanced into the renal pelvis and proximal ureter. The tract was dilated with an Accustick dilator set. A 10 [place] multipurpose drain was reconstituted in the renal pelvis. Catheter was sutured to the skin and attached to gravity bag. Fluoroscopic and ultrasound images were taken and saved for documentation.  COMPLICATIONS: None  FINDINGS: Moderate right hydronephrosis. Catheter placement in the renal pelvis.  IMPRESSION: Successful placement of right percutaneous nephrostomy tube with  ultrasound and fluoroscopic guidance.   Electronically Signed   By: Markus Daft M.D.   On: 08/31/2013 16:02   Ir US Guide Bx Asp/drain  08/31/2013   CLINICAL DATA:  73 year old with right hydronephrosis due to a large retroperitoneal hematoma.  EXAM: PERCUTANEOUS RIGHT NEPHROSTOMY TUBE PLACEMENT WITH ULTRASOUND AND FLUOROSCOPIC GUIDANCE  Physician: Stephan Minister. Henn, MD  MEDICATIONS: Versed 1.5 mg, fentanyl 100 mcg. Ancef. A radiology nurse monitored the patient for moderate sedation.  ANESTHESIA/SEDATION: Moderate sedation time: 30 min  FLUOROSCOPY TIME:  5 min and 36 seconds  PROCEDURE: The procedure was explained to the patient. The risks and benefits of the procedure were discussed and the patient's questions were addressed. Informed consent was obtained from the patient. The patient was placed prone on the interventional table. Ultrasound demonstrated right hydronephrosis. The right flank was prepped and draped in a sterile fashion. Maximal barrier sterile technique was utilized including caps, mask, sterile gowns, sterile gloves, sterile drape, hand hygiene and skin antiseptic. The skin and soft tissues were anesthetized with 1% lidocaine. A 22 gauge Chiba needle was directed into the right lower pole collecting system with ultrasound guidance. Urine was identified at the catheter hub. Small amount of contrast was injected to confirm placement in the collecting system. A 0.018 wire was advanced into the renal pelvis and proximal ureter. The tract was dilated with an Accustick dilator set. A 10 [place] multipurpose drain was reconstituted in the renal pelvis. Catheter was sutured to the skin and attached to gravity bag. Fluoroscopic and ultrasound images were taken and saved for documentation.  COMPLICATIONS: None  FINDINGS: Moderate right hydronephrosis. Catheter placement in the renal pelvis.  IMPRESSION: Successful placement of right percutaneous nephrostomy tube with ultrasound and fluoroscopic guidance.    Electronically Signed   By: Markus Daft M.D.   On: 08/31/2013 16:02   Dg Chest Port 1 View  08/13/2013   CLINICAL DATA:  Preop.  History of hypertension.  EXAM: PORTABLE CHEST - 1 VIEW  COMPARISON:  06/22/2010  FINDINGS: The heart is enlarged. The lungs are free of focal consolidations and pleural effusions. No pulmonary edema.  IMPRESSION: Cardiomegaly.   Electronically Signed   By: Shon Hale M.D.   On: 08/13/2013 01:17    Microbiology: Recent Results (from the past 240 hour(s))  URINE CULTURE     Status: None   Collection Time    08/27/13  8:07 PM      Result Value Range Status   Specimen Description URINE, RANDOM   Final   Special Requests NONE   Final  Culture  Setup Time     Final   Value: 08/28/2013 14:18     Performed at [place]     Final   Value: NO GROWTH     Performed at [facility]   Culture     Final   Value: NO GROWTH     Performed at [facility]   Report Status 08/29/2013 FINAL   Final     Labs: Basic Metabolic Panel:  Recent Labs Lab 08/27/13 1036 08/27/13 2042  08/29/13 0745 08/30/13 0928 08/31/13 1205 09/01/13 0514 09/02/13 0420  NA 136  --   < > 131* 131* 138 137 137  K 4.4  --   < > 4.6 4.6 4.6 4.7 4.6  CL 101  --   < > 99 99 104 104 103  CO2 26  --   < > 18* 18* 22 21 22   GLUCOSE 96  --   < > 122* 181* 102* 114* 121*  BUN 17  --   < > 38* 48* 39* 31* 24*  CREATININE 0.7  --   < > 2.02* 2.14* 1.42* 0.93 0.85  CALCIUM 9.0  --   < > 7.9* 8.0* 8.2* 8.2* 8.5  MG  --  2.1  --   --   --   --   --   --   < > = values in this interval not displayed. Liver Function Tests: No results found for this basename: AST, ALT, ALKPHOS, BILITOT, PROT, ALBUMIN,  in the last 168 hours No results found for this basename: LIPASE, AMYLASE,  in the last 168 hours No results found for this basename: AMMONIA,  in the last 168 hours CBC:  Recent Labs Lab 08/27/13 1036  08/29/13 0745 08/30/13 0928 08/31/13 1205  09/01/13 0514 09/02/13 0420  WBC 10.3  < > 15.2* 14.2* 9.7 9.5 9.0  NEUTROABS 8.2*  --   --   --   --   --   --   HGB 11.2*  < > 7.3* 8.5* 8.1* 7.8* 7.9*  HCT 34.0*  < > 22.1* 24.9* 24.6* 23.6* 24.5*  MCV 84.7  < > 85.7 84.4 86.0 86.8 87.5  PLT 396.0  < > 445* 405* 395 415* 428*  < > = values in this interval not displayed. Cardiac Enzymes: No results found for this basename: CKTOTAL, CKMB, CKMBINDEX, TROPONINI,  in the last 168 hours BNP: BNP (last 3 results) No results found for this basename: PROBNP,  in the last 8760 hours CBG: No results found for this basename: GLUCAP,  in the last 168 hours     Signed:  Kelvin Cellar  [facility] 09/02/2013, 10:35 AM

## 2013-09-02 NOTE — Progress Notes (Signed)
OT Cancellation Note  Patient Details Name: Mckenzie Frank MRN: [id number] DOB: 1941-08-27   Cancelled Treatment:    Reason Eval/Treat Not Completed: Patient politely declined, preparing to d/c home with her daughter this afternoon. Pt ambulated in hallway, ADLs and toileting with nursing this morning   Britt Bottom 09/02/2013, 11:47 AM

## 2013-09-02 NOTE — Progress Notes (Signed)
   CARE MANAGEMENT NOTE 09/02/2013  Patient:  Mckenzie Frank, Mckenzie Frank   Account Number:  [id number]  Date Initiated:  08/31/2013  Documentation initiated by:  [facility]  Subjective/Objective Assessment:   right hydronephrosis related to large retroperitoneal hematoma.     Action/Plan:   SNF vs HH   Anticipated DC Date:  09/02/2013   Anticipated DC Plan:  [place]  CM consult      [facility] Choice  HOME HEALTH   Choice offered to / List presented to:  C-1 Patient        [place] arranged  HH-1 RN  [place].   Status of service:  Completed, signed off Medicare Important Message given?   (If response is "NO", the following Medicare IM given date fields will be blank) Date Medicare IM given:   Date Additional Medicare IM given:    Discharge Disposition:  [place]  Per UR Regulation:    If discussed at Long Length of Stay Meetings, dates discussed:    Comments:  09/02/13 10:30 CM spoke with pt and daughter in room and they choose to go home with [facility] to render HHPT/RN/aide.  Address of services will be pt's daughter's home:  [place] [address] [place], [place] and contact number will be [phone].  No DME is needed as pt has RW at daughter's.  Referral faxed to [facility] with new address and contact number.  No other CM needs were communicated. Mariane Masters, BSN, CM [phone].

## 2013-09-02 NOTE — Progress Notes (Signed)
Mckenzie Frank to be D/C'd Home per MD order.  Discussed with the patient and all questions fully answered.    Medication List    STOP taking these medications       amoxicillin-clavulanate 875-125 MG per tablet  Commonly known as:  AUGMENTIN     furosemide 40 MG tablet  Commonly known as:  LASIX     warfarin 5 MG tablet  Commonly known as:  COUMADIN      TAKE these medications       amLODipine-benazepril 10-40 MG per capsule  Commonly known as:  LOTREL  Take 1 capsule by mouth daily.     aspirin 81 MG chewable tablet  Chew 1 tablet (81 mg total) by mouth daily.     clonazePAM 0.5 MG tablet  Commonly known as:  KLONOPIN  TAKE 1 TABLET BY MOUTH EVERY NIGHT AT BEDTIME     flecainide 100 MG tablet  Commonly known as:  TAMBOCOR  Take 1 tablet (100 mg total) by mouth 2 (two) times daily.     lansoprazole 15 MG capsule  Commonly known as:  PREVACID  Take 1 capsule (15 mg total) by mouth daily.     levothyroxine 25 MCG tablet  Commonly known as:  SYNTHROID, LEVOTHROID  Take 1 tablet (25 mcg total) by mouth daily.     metoprolol 50 MG tablet  Commonly known as:  LOPRESSOR  Take 1 tablet (50 mg total) by mouth 2 (two) times daily.     oxyCODONE 5 MG immediate release tablet  Commonly known as:  Oxy IR/ROXICODONE  Take 1 tablet (5 mg total) by mouth every 6 (six) hours as needed for severe pain.     simvastatin 20 MG tablet  Commonly known as:  ZOCOR  Take 0.5 tablets (10 mg total) by mouth every evening.        VVS, Skin clean, dry and intact without evidence of skin break down, no evidence of skin tears noted. IV catheter discontinued intact. Site without signs and symptoms of complications. Dressing and pressure applied.  An After Visit Summary was printed and given to the patient.  D/c education completed with patient/family including follow up instructions, medication list, d/c activities limitations if indicated, with other d/c instructions as indicated by  MD - patient able to verbalize understanding, all questions fully answered.   Patient instructed to return to ED, call 911, or call MD for any changes in condition.   Patient escorted via [place], and D/C home via private auto.  Wonda Cerise D 09/02/2013 1:25 PM

## 2013-09-03 ENCOUNTER — Telehealth: Payer: Self-pay | Admitting: Family Medicine

## 2013-09-03 NOTE — Telephone Encounter (Signed)
Pt recently discharged for hospital admission 08/27/2013- 09/02/2013 for large rectus sheath hematoma Discharged off coumadin for total 3 wks, placed on aspirin 81mg  instead. Can we call for f/u today or Monday and schedule f/u visit at next avail appt with me?  Thanks.

## 2013-09-06 ENCOUNTER — Ambulatory Visit: Payer: Medicare Other

## 2013-09-06 NOTE — Telephone Encounter (Signed)
Left vm for pt to return call  

## 2013-09-06 NOTE — Telephone Encounter (Signed)
Hospital f/u scheduled for 09/09/13.

## 2013-09-07 ENCOUNTER — Encounter (INDEPENDENT_AMBULATORY_CARE_PROVIDER_SITE_OTHER): Payer: Medicare Other | Admitting: General Surgery

## 2013-09-09 ENCOUNTER — Encounter: Payer: Self-pay | Admitting: Family Medicine

## 2013-09-09 ENCOUNTER — Ambulatory Visit (INDEPENDENT_AMBULATORY_CARE_PROVIDER_SITE_OTHER): Payer: Medicare HMO | Admitting: Family Medicine

## 2013-09-09 VITALS — BP 126/82 | HR 84 | Temp 97.8°F | Wt 284.5 lb

## 2013-09-09 DIAGNOSIS — I4891 Unspecified atrial fibrillation: Secondary | ICD-10-CM

## 2013-09-09 DIAGNOSIS — K5732 Diverticulitis of large intestine without perforation or abscess without bleeding: Secondary | ICD-10-CM

## 2013-09-09 DIAGNOSIS — N179 Acute kidney failure, unspecified: Secondary | ICD-10-CM

## 2013-09-09 DIAGNOSIS — Z5189 Encounter for other specified aftercare: Secondary | ICD-10-CM

## 2013-09-09 DIAGNOSIS — S301XXD Contusion of abdominal wall, subsequent encounter: Secondary | ICD-10-CM

## 2013-09-09 MED ORDER — CLONAZEPAM 0.5 MG PO TABS: ORAL_TABLET | ORAL | Status: DC

## 2013-09-09 NOTE — Progress Notes (Signed)
Subjective:    Patient ID: Mckenzie Frank, female    DOB: 11-02-1940, 73 y.o.   MRN: [id number]  HPI CC: hospital f/u visit.  Mckenzie Frank presents today as hosp f/u for recent rectus sheath hematoma complicated by ARF from compression of R kidney s/p perc nephrostomy tube by urology.  Has HH RN, PT coming out to house.   Presents with eldest daughter Mckenzie Frank today.   Since discharge home has been staying with Mckenzie Frank. Notices swelling in legs, wonders about restarting lasix.  Lasix was held due to ARF during hospitalization. Noticing some evening wheezing.  H/o OSA but doesn't use CPAP machine.  No h/o asthma or COPD /smoking history.  All records reviewed.  Voiding well, some frequency but no dysuria. Empties foley bag regularly  Has f/u appt with urologist on Monday, hopeful to remove nephrostomy at that time..  Wt Readings from Last 3 Encounters:  09/09/13 284 lb 8 oz (129.048 kg)  09/02/13 285 lb 4.8 oz (129.411 kg)  08/27/13 290 lb 8 oz (131.77 kg)    Admit date: 08/27/2013  Discharge date: 09/02/2013  F/u phone call: 09/06/2012 Recommendations for Outpatient Follow-up:  1. Please followup on CBC and BMP on hospital followup visit. Patient treated for acute kidney injury in setting of postobstructive nephropathy in setting of abdominal hematoma. Discharge Diagnoses:  Principal Problem:  Abdominal wall hematoma  Active Problems:  Atrial fibrillation  Hyperlipidemia  Hypertension  Hypothyroidism  Abnormal bruising  Coagulopathy  Nausea and vomiting in adult  ARF (acute renal failure)  Discharge Condition: Stable/improved  Diet recommendation: Heart healthy  History of present illness:  Mckenzie Frank is a 73 y.o. 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 Patient was recently discharged from the hospital on 08/17/2013 after being treated for an acute sigmoid diverticulitis with possible microperforation and abscess formation.  Patient had presented to her PCPs office for hospital followup and administer course of oral antibiotics. Patient was complaining of right lower quadrant sharp stabbing pain which started 2 days prior to admission and had gotten to the point she was unable to sit up straight. Patient also with complaints of upper left upper extremity bruising. CT scan was ordered and after drinking the oral contrast patient had episodes of nausea and emesis as well as some diarrhea. Patient denies any fever, no chills, no chest pain, no shortness of breath, no cough, no constipation, no dysuria, no weakness.  Patient was seen in the ED and CT of the abdomen and pelvis obtained showed a new right rectus sheath hematoma with extension of a small amount of hemorrhage into the prevesical space, multiple subcutaneous hematomas, moderate sized hiatal hernia, sigmoid diverticulosis with resolution of sigmoid diverticulitis. Labs are tender patient's PCPs office had a normal basic metabolic profile. CBC had a hemoglobin of 11.2 otherwise was within normal limits. INR obtained was 2.4. Patient had been on a Lovenox bridge as well as Coumadin since her prior discharge.  We were called to admit the patient for further evaluation and management.  Hospital Course:  Patient is a pleasant 73 year old female with a past medical history of atrial fibrillation, had been on chronic anticoagulation with Coumadin, who presented to the emergency department on 08/27/2013 with complaints of abdominal pain. As part of initial  workup she had a CT scan of abdomen and pelvis performed on 08/27/2013 that showed a right rectus sheath hematoma with extension of a small amount of hemorrhage into the  pre-vesicle space. She was admitted to the medicine service for further evaluation and treatment. Initial lab work showed an INR 2.4 which was reversed and discontinued on admission. Her H&H was followed as there was a downward trend for which she was administered 2 units of packed red blood cells. A repeat CT scan of abdomen and pelvis performed on 08/30/2013 showed significant interval enlargement of the previously noted hematoma in the right rectus abdominis musculature. General surgery was consulted, with no acute surgical indication. It was felt that opening hematoma would be contraindicated unless there was evidence of infection. Imaging studies also showed the development of right-sided hydronephrosis thought to be an obstructive effect from hematoma. This correlated with the upward trend in her creatinine, peaking at 2.14 on 08/30/2013. Dr. Shirlyn Goltz of urology was consulted. On 08/30/2013 she underwent percutaneous nephrostomy tube placement by interventional radiology, tolerated procedure well there were no immediate complications. Patient showed clinical improvement thereafter, with resolution to abdominal pain. Her H&H remained stable. Patient was ambulating down the hallway with physical therapy. Plan to discharge patient home with home health services. She will be started on aspirin 81 mg by mouth daily, with discontinuation of anticoagulation.  Procedures:  Percutaneous nephrostomy tube placement performed on 08/30/2013 Consultations:  General surgery  Urology  Social work  Physical therapy  Past Medical History  Diagnosis Date  . Hyperlipidemia   . Hypertension   . Hypothyroidism     borderline  . Osteoarthritis of knee     s/p replacement  . Breast mass     left  . Benign head tremor   . Atrial fibrillation 2010    s/p DC cardioversion. on coumadin  . Insomnia   . GERD (gastroesophageal reflux disease)   . Chronic bronchitis     "used to get it alot; not so much anymore"  (08/27/2013)  . Sleep apnea     "does not wear mask" (08/27/2013)  . Rectus sheath hematoma 08/27/2013    R after bridging with lovenox  . H/O hiatal hernia     moderate sized/notes  . History of diverticulitis of colon 08/2013    sigmoid with possible microperf/abscess s/p hospitalization complicated by rectus sheath hematoma during lovenox bridge, leading to ARF from hematoma compression on kidney    Past Surgical History  Procedure Laterality Date  . Replacement total knee Bilateral 2005  . Appendectomy  1947  . Dilation and curettage of uterus    . Cholecystectomy  1980  . Cardiac catheterization      [facility];Dr. Clayborn Bigness  . Dexa  10/2012    WNL  . Tonsillectomy    . Joint replacement    . Abdominal exploration surgery  1971    "to see if I needed hysterectomy; dr thought qthing looked too good so he didn't do a hysterectomy at this time" (08/27/2013)  . Vaginal hysterectomy  1971    "2 wks after 1st dr said it was ok" (08/27/2013)  . Cardioversion      Archie Endo 08/27/2013  . Percutaneous nephrostomy  08/2014    ARF from hematoma compression   Review of Systems Per HPI    Objective:   Physical Exam  Nursing note and vitals reviewed. Constitutional: She appears well-developed and well-nourished. No distress.  HENT:  Mouth/Throat: Oropharynx is clear and moist. No  oropharyngeal exudate.  Cardiovascular: Normal rate, regular rhythm, normal heart sounds and intact distal pulses.   No murmur heard. Pulmonary/Chest: Effort normal and breath sounds normal. No respiratory distress. She has no wheezes. She has no rales.  Abdominal:  R nephrostomy tube dressings c/d/i  Musculoskeletal: She exhibits edema (tr pedal edema).  Skin: Skin is warm and dry. No rash noted.  Psychiatric: She has a normal mood and affect.       Assessment & Plan:

## 2013-09-09 NOTE — Progress Notes (Signed)
Pre-visit discussion using our clinic review tool. No additional management support is needed unless otherwise documented below in the visit note.  

## 2013-09-09 NOTE — Patient Instructions (Signed)
Good to see you today. Blood work today. Continue working with physical therapy for reconditioning and strengthening after long hospital stay. Return to see me in 2 weeks for follow up - and discuss starting coumadin at that time.

## 2013-09-10 ENCOUNTER — Telehealth: Payer: Self-pay

## 2013-09-10 LAB — CBC WITH DIFFERENTIAL/PLATELET
BASOS ABS: 0.1 10*3/uL (ref 0.0–0.1)
Basophils Relative: 0.7 % (ref 0.0–3.0)
Eosinophils Absolute: 0.3 10*3/uL (ref 0.0–0.7)
Eosinophils Relative: 3.4 % (ref 0.0–5.0)
HCT: 30.4 % — ABNORMAL LOW (ref 36.0–46.0)
HEMOGLOBIN: 9.9 g/dL — AB (ref 12.0–15.0)
LYMPHS PCT: 17.2 % (ref 12.0–46.0)
Lymphs Abs: 1.5 10*3/uL (ref 0.7–4.0)
MCHC: 32.5 g/dL (ref 30.0–36.0)
MCV: 86.8 fl (ref 78.0–100.0)
MONOS PCT: 7.3 % (ref 3.0–12.0)
Monocytes Absolute: 0.6 10*3/uL (ref 0.1–1.0)
Neutro Abs: 6.1 10*3/uL (ref 1.4–7.7)
Neutrophils Relative %: 71.4 % (ref 43.0–77.0)
Platelets: 409 10*3/uL — ABNORMAL HIGH (ref 150.0–400.0)
RBC: 3.51 Mil/uL — ABNORMAL LOW (ref 3.87–5.11)
RDW: 16.8 % — AB (ref 11.5–14.6)
WBC: 8.6 10*3/uL (ref 4.5–10.5)

## 2013-09-10 LAB — BASIC METABOLIC PANEL
BUN: 13 mg/dL (ref 6–23)
CHLORIDE: 105 meq/L (ref 96–112)
CO2: 28 meq/L (ref 19–32)
Calcium: 8.8 mg/dL (ref 8.4–10.5)
Creatinine, Ser: 0.9 mg/dL (ref 0.4–1.2)
GFR: 68.48 mL/min (ref >60.00)
Glucose, Bld: 111 mg/dL — ABNORMAL HIGH (ref 70–99)
POTASSIUM: 4 meq/L (ref 3.5–5.1)
Sodium: 140 mEq/L (ref 135–145)

## 2013-09-10 NOTE — Telephone Encounter (Signed)
Pt left v/m;pt is staying with her daughter; pt was seen 09/09/13 and thought beginning of cold symptoms; this morning pt has S/T, non prod cough,wheezing and diarrhea. No fever. Pt request Zpak or antibiotic to [facility]. Pt request cb.

## 2013-09-10 NOTE — Telephone Encounter (Signed)
Patient notified. She denied any body aches. Advised to try tylenol for any pain and to call me with an update on Monday. She verbalized understanding.

## 2013-09-10 NOTE — Telephone Encounter (Signed)
This is more likely influenza or viral infection - abx will likely not be beneficial in this case and could worsen diarrhea. Any body aches?  If not, would give this more time and have her call us on Monday with an update.  In interim, take tylenol for discomfort, lots of fluids and rest over weekend.

## 2013-09-11 ENCOUNTER — Other Ambulatory Visit: Payer: Self-pay | Admitting: Family Medicine

## 2013-09-11 ENCOUNTER — Encounter: Payer: Self-pay | Admitting: Family Medicine

## 2013-09-11 MED ORDER — FUROSEMIDE 40 MG PO TABS: 40.0000 mg | ORAL_TABLET | Freq: Every day | ORAL | Status: DC

## 2013-09-11 NOTE — Assessment & Plan Note (Signed)
Fully resolved.

## 2013-09-11 NOTE — Assessment & Plan Note (Addendum)
recheck Cr today, consider restarting lasix.  Has urology f/u pending.

## 2013-09-11 NOTE — Assessment & Plan Note (Signed)
Recuperating slowly but steadily after large rectus sheath hematoma. Will continue to hold coumadin, for full 3 wks, use aspirin in interim.  Consider restarting full anticoagulation the week of 09/20/2013 W/O lovenox bridge. Recheck CBC today.

## 2013-09-11 NOTE — Assessment & Plan Note (Addendum)
Currently only on aspirin 81mg  daily after recent bleed.  Will want full 3-4 weeks off coumadin. Has maintained sinus rhythm on flecainide. chadsvasc2 = 3.   Will obtain Dr. Tyrell Antonio advice on recommencement of anticoagulation.

## 2013-09-13 ENCOUNTER — Other Ambulatory Visit: Payer: Self-pay

## 2013-09-13 ENCOUNTER — Telehealth: Payer: Self-pay | Admitting: Family Medicine

## 2013-09-13 ENCOUNTER — Ambulatory Visit (INDEPENDENT_AMBULATORY_CARE_PROVIDER_SITE_OTHER): Payer: Medicare HMO | Admitting: Family Medicine

## 2013-09-13 ENCOUNTER — Ambulatory Visit: Payer: Medicare Other

## 2013-09-13 ENCOUNTER — Encounter: Payer: Self-pay | Admitting: Family Medicine

## 2013-09-13 VITALS — BP 150/70 | HR 80 | Temp 98.1°F | Wt 291.2 lb

## 2013-09-13 DIAGNOSIS — S301XXD Contusion of abdominal wall, subsequent encounter: Secondary | ICD-10-CM

## 2013-09-13 DIAGNOSIS — J205 Acute bronchitis due to respiratory syncytial virus: Secondary | ICD-10-CM | POA: Insufficient documentation

## 2013-09-13 DIAGNOSIS — N139 Obstructive and reflux uropathy, unspecified: Secondary | ICD-10-CM

## 2013-09-13 DIAGNOSIS — N179 Acute kidney failure, unspecified: Secondary | ICD-10-CM

## 2013-09-13 DIAGNOSIS — J209 Acute bronchitis, unspecified: Secondary | ICD-10-CM

## 2013-09-13 DIAGNOSIS — Z5189 Encounter for other specified aftercare: Secondary | ICD-10-CM

## 2013-09-13 HISTORY — DX: Acute bronchitis due to respiratory syncytial virus: J20.5

## 2013-09-13 MED ORDER — METHYLPREDNISOLONE ACETATE 80 MG/ML IJ SUSP
80.0000 mg | Freq: Once | INTRAMUSCULAR | Status: AC
  Administered 2013-09-13: 80 mg via INTRAMUSCULAR

## 2013-09-13 MED ORDER — AMOXICILLIN-POT CLAVULANATE 875-125 MG PO TABS: 1.0000 | ORAL_TABLET | Freq: Two times a day (BID) | ORAL | Status: DC

## 2013-09-13 NOTE — Assessment & Plan Note (Signed)
Acute bronchitis with wheezing - after recent hospitalization and recent cipro treatment of diverticulitis - will treat with augmentin course and methylprednisolone 80mg  IM injection given wheeze. Advised to update me if persistent sxs or worsening. Pt/daughter agrees with plan.

## 2013-09-13 NOTE — Telephone Encounter (Signed)
[facility] Triage Call Report Triage Record Num: [id number] Operator: Vaughan Sine Patient Name: Mckenzie Frank [facility] Call Date & Time: 09/12/2013 4:09:23PM Patient Phone: [phone] PCP: Ria Bush Patient Gender: Female PCP Fax : [phone] Patient DOB: Jan 18, 1941 Practice Name: [facility] Reason for Call: Caller: [name]/Patient; PCP: Ria Bush [facility]); CB#: [phone]; Call regarding productive Cough, whilte sputum, Sore Throat, onset 1-10. Pt spoke w/ office on 1-10, Pt states she was advised to call back if sxs worsen. Advised Pt no standing orders to call in Abx. Traige offered. Pt will f/u w/ office on 1-12 when open. Protocol(s) Used: Information Only Call; No Symptom Triage (Adult) Recommended Outcome per Protocol: Provide Information or Advice Only Reason for Outcome: Health information question; person denies any symptoms, no triage required. Information provided from approved references or clinical experience. Care Advice: ~ 09/12/2013 4:15:37PM Page 1 of 1 [email or web address]

## 2013-09-13 NOTE — Addendum Note (Signed)
Addended by: Josetta Huddle on: 09/13/2013 03:44 PM   Modules accepted: Orders

## 2013-09-13 NOTE — Telephone Encounter (Signed)
Spoke with patient's daughter. Cough is worse and productive. She is still has ST and wheezing and they think the wheezing has gotten worse as well. No fever, CP, SOB or HA. Appt scheduled for today.

## 2013-09-13 NOTE — Progress Notes (Signed)
   Subjective:    Patient ID: Mckenzie Frank, female    DOB: 12-Aug-1941, 73 y.o.   MRN: [id number]  HPI CC: cough  See prior note for recent complicated hospitalization and events. Saw urology today - recommended rpt CT scan but they were unable to schedule this because insurance requires referral to urology as of new year.    Mild cough started on Thursday, along with sore throat on Friday.  Very dyspneic and wheezy.  + chest congestion.  Productive cough of thick white mucous.  No chest pain.  No fevers/chills.  No headache, facial pressure or head congestion, ear or tooth pain. Has been treating with extra strength tylenol. Did receive flu shot this year. No sick contacts at home. No h/o asthma or COPD.  No smoking history.  + h/o OSA.  Past Medical History  Diagnosis Date  . Hyperlipidemia   . Hypertension   . Hypothyroidism     borderline  . Osteoarthritis of knee     s/p replacement  . Breast mass     left  . Benign head tremor   . Atrial fibrillation 2010    s/p DC cardioversion. on coumadin  . Insomnia   . GERD (gastroesophageal reflux disease)   . Chronic bronchitis     "used to get it alot; not so much anymore" (08/27/2013)  . Sleep apnea     "does not wear mask" (08/27/2013)  . Rectus sheath hematoma 08/27/2013    R after bridging with lovenox  . H/O hiatal hernia     moderate sized/notes  . History of diverticulitis of colon 08/2013    sigmoid with possible microperf/abscess s/p hospitalization complicated by rectus sheath hematoma during lovenox bridge, leading to ARF from hematoma compression on kidney     Review of Systems Per HPI    Objective:   Physical Exam  Nursing note and vitals reviewed. Constitutional: She appears well-developed and well-nourished. No distress.  HENT:  Head: Normocephalic and atraumatic.  Right Ear: Hearing, tympanic membrane, external ear and ear canal normal.  Left Ear: Hearing, tympanic membrane, external ear and ear  canal normal.  Nose: No mucosal edema or rhinorrhea. Right sinus exhibits no maxillary sinus tenderness and no frontal sinus tenderness. Left sinus exhibits no maxillary sinus tenderness and no frontal sinus tenderness.  Mouth/Throat: Uvula is midline, oropharynx is clear and moist and mucous membranes are normal. No oropharyngeal exudate, posterior oropharyngeal edema, posterior oropharyngeal erythema or tonsillar abscesses.  Eyes: Conjunctivae and EOM are normal. Pupils are equal, round, and reactive to light. No scleral icterus.  Neck: Normal range of motion. Neck supple.  Cardiovascular: Normal rate, regular rhythm, normal heart sounds and intact distal pulses.   No murmur heard. Pulmonary/Chest: Effort normal. No respiratory distress. She has no decreased breath sounds. She has wheezes (diffuse exp wheezing). She has no rhonchi. She has no rales.  Evident wheezing in lungs but mainly upper respiratory wheezing  Musculoskeletal: She exhibits no edema.  Lymphadenopathy:    She has no cervical adenopathy.  Skin: Skin is warm and dry. No rash noted.       Assessment & Plan:

## 2013-09-13 NOTE — Telephone Encounter (Signed)
plz call today for update - any fever, how is productive cough?  If persistent sxs, recommend come in for evaluation.

## 2013-09-13 NOTE — Progress Notes (Signed)
Pre-visit discussion using our clinic review tool. No additional management support is needed unless otherwise documented below in the visit note.  

## 2013-09-13 NOTE — Assessment & Plan Note (Signed)
Will refer to urology per insurance requisite for post renal ARF from obstruction from bleed from rectus sheath hematoma s/p R perc nephrostomy.

## 2013-09-13 NOTE — Patient Instructions (Addendum)
Pass by Marion's office for formal referral to urology. Restart lasix 40mg  daily with second as needed. I think you have bronchitis - treat with augmentin 10 d course and shot of steroids today.  If persistent wheezing or shortwinded despite steroid please let me know.

## 2013-09-13 NOTE — Telephone Encounter (Signed)
Corena pts daughter said pt was seen today and augment[facility] was sent to [facility]; wanted med to go to [facility] and have med delivered. Corena will call [facility] phar about delivering med and Scott at [facility] will cancel rx.

## 2013-09-14 ENCOUNTER — Inpatient Hospital Stay (HOSPITAL_COMMUNITY)
Admission: EM | Admit: 2013-09-14 | Discharge: 2013-09-19 | DRG: 193 | Disposition: A | Discharge status: Home Health | Payer: Medicare HMO | Attending: Internal Medicine | Admitting: Internal Medicine

## 2013-09-14 ENCOUNTER — Emergency Department (HOSPITAL_COMMUNITY): Payer: Medicare HMO

## 2013-09-14 ENCOUNTER — Encounter (HOSPITAL_COMMUNITY): Payer: Self-pay | Admitting: Emergency Medicine

## 2013-09-14 DIAGNOSIS — I059 Rheumatic mitral valve disease, unspecified: Secondary | ICD-10-CM | POA: Diagnosis present

## 2013-09-14 DIAGNOSIS — Z885 Allergy status to narcotic agent status: Secondary | ICD-10-CM

## 2013-09-14 DIAGNOSIS — N1330 Unspecified hydronephrosis: Secondary | ICD-10-CM | POA: Diagnosis present

## 2013-09-14 DIAGNOSIS — E8779 Other fluid overload: Secondary | ICD-10-CM | POA: Diagnosis present

## 2013-09-14 DIAGNOSIS — Z888 Allergy status to other drugs, medicaments and biological substances status: Secondary | ICD-10-CM

## 2013-09-14 DIAGNOSIS — E039 Hypothyroidism, unspecified: Secondary | ICD-10-CM | POA: Diagnosis present

## 2013-09-14 DIAGNOSIS — Z823 Family history of stroke: Secondary | ICD-10-CM

## 2013-09-14 DIAGNOSIS — R5381 Other malaise: Secondary | ICD-10-CM | POA: Diagnosis not present

## 2013-09-14 DIAGNOSIS — J45902 Unspecified asthma with status asthmaticus: Secondary | ICD-10-CM

## 2013-09-14 DIAGNOSIS — J121 Respiratory syncytial virus pneumonia: Principal | ICD-10-CM | POA: Diagnosis present

## 2013-09-14 DIAGNOSIS — Z882 Allergy status to sulfonamides status: Secondary | ICD-10-CM

## 2013-09-14 DIAGNOSIS — E785 Hyperlipidemia, unspecified: Secondary | ICD-10-CM | POA: Diagnosis present

## 2013-09-14 DIAGNOSIS — Z7901 Long term (current) use of anticoagulants: Secondary | ICD-10-CM

## 2013-09-14 DIAGNOSIS — J209 Acute bronchitis, unspecified: Secondary | ICD-10-CM

## 2013-09-14 DIAGNOSIS — I4891 Unspecified atrial fibrillation: Secondary | ICD-10-CM | POA: Diagnosis present

## 2013-09-14 DIAGNOSIS — D72829 Elevated white blood cell count, unspecified: Secondary | ICD-10-CM

## 2013-09-14 DIAGNOSIS — Z936 Other artificial openings of urinary tract status: Secondary | ICD-10-CM

## 2013-09-14 DIAGNOSIS — I10 Essential (primary) hypertension: Secondary | ICD-10-CM | POA: Diagnosis present

## 2013-09-14 DIAGNOSIS — M7981 Nontraumatic hematoma of soft tissue: Secondary | ICD-10-CM | POA: Diagnosis present

## 2013-09-14 DIAGNOSIS — T45515A Adverse effect of anticoagulants, initial encounter: Secondary | ICD-10-CM | POA: Diagnosis present

## 2013-09-14 DIAGNOSIS — R259 Unspecified abnormal involuntary movements: Secondary | ICD-10-CM | POA: Diagnosis present

## 2013-09-14 DIAGNOSIS — Z8249 Family history of ischemic heart disease and other diseases of the circulatory system: Secondary | ICD-10-CM

## 2013-09-14 DIAGNOSIS — J210 Acute bronchiolitis due to respiratory syncytial virus: Secondary | ICD-10-CM | POA: Diagnosis present

## 2013-09-14 DIAGNOSIS — K219 Gastro-esophageal reflux disease without esophagitis: Secondary | ICD-10-CM | POA: Diagnosis present

## 2013-09-14 DIAGNOSIS — Z96659 Presence of unspecified artificial knee joint: Secondary | ICD-10-CM

## 2013-09-14 DIAGNOSIS — Z833 Family history of diabetes mellitus: Secondary | ICD-10-CM

## 2013-09-14 DIAGNOSIS — G4730 Sleep apnea, unspecified: Secondary | ICD-10-CM | POA: Diagnosis present

## 2013-09-14 DIAGNOSIS — J9600 Acute respiratory failure, unspecified whether with hypoxia or hypercapnia: Secondary | ICD-10-CM | POA: Diagnosis present

## 2013-09-14 DIAGNOSIS — S301XXA Contusion of abdominal wall, initial encounter: Secondary | ICD-10-CM

## 2013-09-14 DIAGNOSIS — Z79899 Other long term (current) drug therapy: Secondary | ICD-10-CM

## 2013-09-14 DIAGNOSIS — R0603 Acute respiratory distress: Secondary | ICD-10-CM | POA: Diagnosis present

## 2013-09-14 DIAGNOSIS — Z820 Family history of epilepsy and other diseases of the nervous system: Secondary | ICD-10-CM

## 2013-09-14 LAB — CBC WITH DIFFERENTIAL/PLATELET
Basophils Absolute: 0 10*3/uL (ref 0.0–0.1)
Basophils Relative: 0 % (ref 0–1)
EOS ABS: 0.1 10*3/uL (ref 0.0–0.7)
Eosinophils Relative: 2 % (ref 0–5)
HCT: 34.5 % — ABNORMAL LOW (ref 36.0–46.0)
Hemoglobin: 10.9 g/dL — ABNORMAL LOW (ref 12.0–15.0)
LYMPHS PCT: 26 % (ref 12–46)
Lymphs Abs: 1.3 10*3/uL (ref 0.7–4.0)
MCH: 28.1 pg (ref 26.0–34.0)
MCHC: 31.6 g/dL (ref 30.0–36.0)
MCV: 88.9 fL (ref 78.0–100.0)
Monocytes Absolute: 0.7 10*3/uL (ref 0.1–1.0)
Monocytes Relative: 15 % — ABNORMAL HIGH (ref 3–12)
Neutro Abs: 2.9 10*3/uL (ref 1.7–7.7)
Neutrophils Relative %: 57 % (ref 43–77)
Platelets: 283 10*3/uL (ref 150–400)
RBC: 3.88 MIL/uL (ref 3.87–5.11)
RDW: 17 % — AB (ref 11.5–15.5)
WBC: 5 10*3/uL (ref 4.0–10.5)

## 2013-09-14 LAB — INFLUENZA PANEL BY PCR (TYPE A & B)
H1N1 flu by pcr: NOT DETECTED
INFLAPCR: NEGATIVE
Influenza B By PCR: NEGATIVE

## 2013-09-14 LAB — BASIC METABOLIC PANEL
BUN: 15 mg/dL (ref 6–23)
CO2: 24 meq/L (ref 19–32)
Calcium: 8.4 mg/dL (ref 8.4–10.5)
Chloride: 99 mEq/L (ref 96–112)
Creatinine, Ser: 0.88 mg/dL (ref 0.50–1.10)
GFR, EST AFRICAN AMERICAN: 73 mL/min — AB (ref >90)
GFR, EST NON AFRICAN AMERICAN: 63 mL/min — AB (ref >90)
Glucose, Bld: 131 mg/dL — ABNORMAL HIGH (ref 70–99)
Potassium: 3.6 mEq/L — ABNORMAL LOW (ref 3.7–5.3)
SODIUM: 138 meq/L (ref 137–147)

## 2013-09-14 LAB — TROPONIN I: Troponin I: <0.3 ng/mL (ref <0.30)

## 2013-09-14 MED ORDER — AMLODIPINE BESY-BENAZEPRIL HCL 10-40 MG PO CAPS: 1.0000 | ORAL_CAPSULE | Freq: Every day | ORAL | Status: DC

## 2013-09-14 MED ORDER — PANTOPRAZOLE SODIUM 20 MG PO TBEC
20.0000 mg | DELAYED_RELEASE_TABLET | Freq: Every day | ORAL | Status: DC
  Administered 2013-09-15 – 2013-09-16 (×2): 20 mg via ORAL
  Filled 2013-09-14 (×2): qty 1

## 2013-09-14 MED ORDER — FLECAINIDE ACETATE 100 MG PO TABS
100.0000 mg | ORAL_TABLET | Freq: Once | ORAL | Status: AC
  Administered 2013-09-14: 100 mg via ORAL
  Filled 2013-09-14: qty 1

## 2013-09-14 MED ORDER — IPRATROPIUM BROMIDE 0.02 % IN SOLN: 0.5000 mg | RESPIRATORY_TRACT | Status: DC | PRN

## 2013-09-14 MED ORDER — IPRATROPIUM BROMIDE 0.02 % IN SOLN
0.5000 mg | RESPIRATORY_TRACT | Status: DC
  Administered 2013-09-14 – 2013-09-15 (×4): 0.5 mg via RESPIRATORY_TRACT
  Filled 2013-09-14 (×4): qty 2.5

## 2013-09-14 MED ORDER — SODIUM CHLORIDE 0.9 % IJ SOLN
3.0000 mL | Freq: Two times a day (BID) | INTRAMUSCULAR | Status: DC
  Administered 2013-09-15 – 2013-09-19 (×9): 3 mL via INTRAVENOUS

## 2013-09-14 MED ORDER — ACETAMINOPHEN 500 MG PO TABS: 1000.0000 mg | ORAL_TABLET | Freq: Four times a day (QID) | ORAL | Status: DC | PRN

## 2013-09-14 MED ORDER — CLONAZEPAM 0.5 MG PO TABS
0.5000 mg | ORAL_TABLET | Freq: Every day | ORAL | Status: DC
  Administered 2013-09-15 – 2013-09-18 (×4): 0.5 mg via ORAL
  Filled 2013-09-14 (×4): qty 1

## 2013-09-14 MED ORDER — FLECAINIDE ACETATE 100 MG PO TABS
100.0000 mg | ORAL_TABLET | Freq: Two times a day (BID) | ORAL | Status: DC
  Administered 2013-09-15 – 2013-09-19 (×9): 100 mg via ORAL
  Filled 2013-09-14 (×12): qty 1

## 2013-09-14 MED ORDER — METHYLPREDNISOLONE SODIUM SUCC 125 MG IJ SOLR
125.0000 mg | Freq: Once | INTRAMUSCULAR | Status: AC
  Administered 2013-09-14: 125 mg via INTRAVENOUS
  Filled 2013-09-14: qty 2

## 2013-09-14 MED ORDER — ALBUTEROL SULFATE (2.5 MG/3ML) 0.083% IN NEBU: 2.5000 mg | INHALATION_SOLUTION | RESPIRATORY_TRACT | Status: DC | PRN

## 2013-09-14 MED ORDER — ALBUTEROL (5 MG/ML) CONTINUOUS INHALATION SOLN
10.0000 mg/h | INHALATION_SOLUTION | Freq: Once | RESPIRATORY_TRACT | Status: AC
  Administered 2013-09-14: 10 mg/h via RESPIRATORY_TRACT
  Filled 2013-09-14: qty 20

## 2013-09-14 MED ORDER — ASPIRIN 81 MG PO CHEW
81.0000 mg | CHEWABLE_TABLET | Freq: Every day | ORAL | Status: DC
  Administered 2013-09-15 – 2013-09-19 (×5): 81 mg via ORAL
  Filled 2013-09-14 (×4): qty 1

## 2013-09-14 MED ORDER — METHYLPREDNISOLONE SODIUM SUCC 125 MG IJ SOLR
125.0000 mg | Freq: Four times a day (QID) | INTRAMUSCULAR | Status: DC
  Administered 2013-09-15 – 2013-09-16 (×6): 125 mg via INTRAVENOUS
  Filled 2013-09-14 (×9): qty 2

## 2013-09-14 MED ORDER — LEVOTHYROXINE SODIUM 25 MCG PO TABS
25.0000 ug | ORAL_TABLET | Freq: Every day | ORAL | Status: DC
  Administered 2013-09-15 – 2013-09-19 (×5): 25 ug via ORAL
  Filled 2013-09-14 (×6): qty 1

## 2013-09-14 MED ORDER — ALBUTEROL SULFATE (2.5 MG/3ML) 0.083% IN NEBU
2.5000 mg | INHALATION_SOLUTION | RESPIRATORY_TRACT | Status: DC
  Administered 2013-09-14 – 2013-09-15 (×4): 2.5 mg via RESPIRATORY_TRACT
  Filled 2013-09-14 (×4): qty 3

## 2013-09-14 MED ORDER — SIMVASTATIN 10 MG PO TABS
10.0000 mg | ORAL_TABLET | Freq: Every evening | ORAL | Status: DC
  Administered 2013-09-15 – 2013-09-18 (×4): 10 mg via ORAL
  Filled 2013-09-14 (×5): qty 1

## 2013-09-14 NOTE — ED Notes (Signed)
Pt. Reports feeling SOB last Thursday and saw PCP for bronchitis. States had to sleep in recliner due to SOB lying flat. States this evening SOB became worse. Pt. Labored breathing with wheezing and rhonchi. Recently hospitalized for diverticulitis and had drain place for hematoma development over left kidney. Pt. Alert and oriented x4.

## 2013-09-14 NOTE — ED Notes (Signed)
Corena (daughter) contact- [phone]

## 2013-09-14 NOTE — ED Notes (Signed)
Admitting at bedside 

## 2013-09-14 NOTE — ED Provider Notes (Signed)
CSN: [id number]     Arrival date & time 09/14/13  2033 History   First MD Initiated Contact with Patient 09/14/13 2049     Chief Complaint  Patient presents with  . Shortness of Breath   HPI  Patient has history of recent hospitalization for diverticulitis.  Ureteral stricture requiring nephrostomy tube, cardioversion for A. fib.  Chronically on Tambocor and Coumadin.  Symptoms started 2 days ago was "bronchitis".  A dry cough body aches no fever mild headache shortness of breath.  No chest pain.  No peripheral edema.  No GI complaints.  Wheezing at home.  Brought in by family for evaluation.  They stopped at a fire department because of her distress.  Was given continuous nebs en route.  Lifetime nonsmoker.  No history of COPD, probable spasm, childhood asthma  Past Medical History  Diagnosis Date  . Hyperlipidemia   . Hypertension   . Hypothyroidism     borderline  . Osteoarthritis of knee     s/p replacement  . Breast mass     left  . Benign head tremor   . Atrial fibrillation 2010    s/p DC cardioversion. on coumadin  . Insomnia   . GERD (gastroesophageal reflux disease)   . Chronic bronchitis     "used to get it alot; not so much anymore" (08/27/2013)  . Sleep apnea     "does not wear mask" (08/27/2013)  . Rectus sheath hematoma 08/27/2013    R after bridging with lovenox  . H/O hiatal hernia     moderate sized/notes  . History of diverticulitis of colon 08/2013    sigmoid with possible microperf/abscess s/p hospitalization complicated by rectus sheath hematoma during lovenox bridge, leading to ARF from hematoma compression on kidney   Past Surgical History  Procedure Laterality Date  . Replacement total knee Bilateral 2005  . Appendectomy  1947  . Dilation and curettage of uterus    . Cholecystectomy  1980  . Cardiac catheterization      [facility];Dr. Clayborn Bigness  . Dexa  10/2012    WNL  . Tonsillectomy    . Joint replacement    . Abdominal exploration surgery  1971   "to see if I needed hysterectomy; dr thought qthing looked too good so he didn't do a hysterectomy at this time" (08/27/2013)  . Vaginal hysterectomy  1971    "2 wks after 1st dr said it was ok" (08/27/2013)  . Cardioversion      Ar[facility] Endo 08/27/2013  . Percutaneous nephrostomy  08/2014    ARF from hematoma compression   Family History  Problem Relation Age of Onset  . CAD Mother     angina  . Cancer Paternal Grandfather     ?  Marland Kitchen Alzheimer's disease Father     died from PNA  . Diabetes Mother   . Stroke Mother 72   History  Substance Use Topics  . Smoking status: Never Smoker   . Smokeless tobacco: Never Used  . Alcohol Use: No   OB History   Grav Para Term Preterm Abortions TAB SAB Ect Mult Living                 Review of Systems  Constitutional: Negative for fever, chills, diaphoresis, appetite change and fatigue.  HENT: Positive for congestion, sneezing and sore throat. Negative for mouth sores and trouble swallowing.   Eyes: Negative for visual disturbance.  Respiratory: Positive for shortness of breath and wheezing. Negative for cough and  chest tightness.   Cardiovascular: Negative for chest pain.  Gastrointestinal: Negative for nausea, vomiting, abdominal pain, diarrhea and abdominal distention.  Endocrine: Negative for polydipsia, polyphagia and polyuria.  Genitourinary: Negative for dysuria, frequency and hematuria.  Musculoskeletal: Negative for gait problem.  Skin: Negative for color change, pallor and rash.  Neurological: Negative for dizziness, syncope, light-headedness and headaches.  Hematological: Does not bruise/bleed easily.  Psychiatric/Behavioral: Negative for behavioral problems and confusion.    Allergies  Paxil; Clindamycin/lincomycin; Codeine; Doxycycline; Promethazine; Sulfa antibiotics; and Valium  Home Medications   Current Outpatient Rx  Name  Route  Sig  Dispense  Refill  . acetaminophen (TYLENOL) 500 MG tablet   Oral   Take 1,000 mg by  mouth every 6 (six) hours as needed for moderate pain.         Marland Kitchen amLODipine-benazepril (LOTREL) 10-40 MG per capsule   Oral   Take 1 capsule by mouth daily.   90 capsule   0   . amoxicillin-clavulanate (AUGMENTIN) 875-125 MG per tablet   Oral   Take 1 tablet by mouth 2 (two) times daily.   20 tablet   0     Request med to be delivered.   Marland Kitchen aspirin 81 MG chewable tablet   Oral   Chew 1 tablet (81 mg total) by mouth daily.   30 tablet   0   . clonazePAM (KLONOPIN) 0.5 MG tablet   Oral   Take 0.5 mg by mouth at bedtime.         . flecainide (TAMBOCOR) 100 MG tablet   Oral   Take 1 tablet (100 mg total) by mouth 2 (two) times daily.   180 tablet   0   . furosemide (LASIX) 40 MG tablet   Oral   Take 1 tablet (40 mg total) by mouth daily. 2nd dose prn   90 tablet   3   . lansoprazole (PREVACID) 15 MG capsule   Oral   Take 1 capsule (15 mg total) by mouth daily.   90 capsule   0   . levothyroxine (SYNTHROID, LEVOTHROID) 25 MCG tablet   Oral   Take 1 tablet (25 mcg total) by mouth daily.   90 tablet   1   . metoprolol (LOPRESSOR) 50 MG tablet   Oral   Take 1 tablet (50 mg total) by mouth 2 (two) times daily.   180 tablet   0   . simvastatin (ZOCOR) 20 MG tablet   Oral   Take 0.5 tablets (10 mg total) by mouth every evening.   45 tablet   0    BP 155/55  Pulse 81  Temp(Src) 99.8 F (37.7 C)  SpO2 95% Physical Exam  Constitutional: She is oriented to person, place, and time. She appears well-developed and well-nourished. No distress.  White female.  Awake and alert.  Does not appear fatigued.  She is  audibly wheezing.  Tachypnea  HENT:  Head: Normocephalic.  Eyes: Conjunctivae are normal. Pupils are equal, round, and reactive to light. No scleral icterus.  Neck: Normal range of motion. Neck supple. No thyromegaly present.  No JVD  Cardiovascular: Normal rate and regular rhythm.  Exam reveals no gallop and no friction rub.   No murmur heard. Not  tachycardic.  Sinus rhythm on the monitor.  Pulmonary/Chest: Effort normal and breath sounds normal. No respiratory distress. She has no wheezes. She has no rales.  Diffuse prolongation and wheezing all fields.  Some accessory muscle use.  Abdominal:  Soft. Bowel sounds are normal. She exhibits no distension. There is no tenderness. There is no rebound.  Musculoskeletal: Normal range of motion.  Neurological: She is alert and oriented to person, place, and time.  Skin: Skin is warm and dry. No rash noted.  Well perfused.  Pink, not cyanotic  Psychiatric: She has a normal mood and affect. Her behavior is normal.    ED Course  Procedures (including critical care time) Labs Review Labs Reviewed  CBC WITH DIFFERENTIAL - Abnormal; Notable for the following:    Hemoglobin 10.9 (*)    HCT 34.5 (*)    RDW 17.0 (*)    Monocytes Relative 15 (*)    All other components within normal limits  BASIC METABOLIC PANEL - Abnormal; Notable for the following:    Potassium 3.6 (*)    Glucose, Bld 131 (*)    GFR calc non Af Amer 63 (*)    GFR calc Af Amer 73 (*)    All other components within normal limits  TROPONIN I  INFLUENZA PANEL BY PCR (TYPE A & B, H1N1)  COMPREHENSIVE METABOLIC PANEL  CBC   Imaging Review Dg Chest Port 1 View  09/14/2013   CLINICAL DATA:  Shortness of breath and cough  EXAM: PORTABLE CHEST - 1 VIEW  COMPARISON:  None.  FINDINGS: The heart size and mediastinal contours are within normal limits. Both lungs are clear. The visualized skeletal structures are unremarkable.  IMPRESSION: No active disease.   Electronically Signed   By: Inez Catalina M.D.   On: 09/14/2013 21:18    EKG Interpretation   None       MDM   1. Status asthmaticus    History of A. fib.  On Tambocor status post cardioversion.  Anticoagulated.  Not currently in A. fib.  No signs of congestive heart failure.  His nephrostomy tube.  Her current renal function is normal.  Bronchospasm with hypoxemia.   Possible influenza.  Normal chest x-ray.  Symptoms refractory to continuous nebs and multiple additional nebs consistent with status asthmaticus.  Care discussed with Dr. Ernestina Patches.  Plan is admission to the hospital for further care.  Given IV steroids upon arrival.    Tanna Furry, MD 09/14/13 2349

## 2013-09-14 NOTE — ED Notes (Signed)
Per EMS, pt. SOB starting yesterday. Given 5 mg albuterol and a duo neb PTA. Productive cough, denies CP. Pt. Has drainage bag for hematoma over kidney. Pt. Audibly wheezing on arrival

## 2013-09-14 NOTE — H&P (Signed)
Hospitalist Admission History and Physical  Patient name: Mckenzie Frank Medical record number: [id number] Date of birth: 24-Dec-1940 Age: 73 y.o. Gender: female  Primary Care Provider: Ria Bush, MD  Chief Complaint: Resp distress, bronchitis  History of Present Illness:This is a 73 y.o. year old female with significant past medical history of HTN, afib, diverticulosis with recent diverticulitis, as recent admission for urinary obstruction in setting of abdominal wall hematoma s/p nephrectomy tube placement here with resp distress and wheezing. Pt is noted to have had a fairly complicated medical course including admission for diverticulitis w/ resolution and development of abdominal wall hematoma in setting on anticoagulation bridiging w/ secondary urinary obstruction s/p perc nephrectomy tube placement. Pt was seen at her PCPs office 09/13/13 when pt reported having some URI sxs with cough and wheezing. Pt was given depomedrol 80mg  IM x1 as well as course of augmentin. No prior hx/o asthma, COPD or smoking in the past. + flu shot this year. No fevers or chills. Minimal myalgias. Mild rhinorrhea and nasal congestion. Pt states that she had persistent wheezing, cough and resp distress despite treatment. Pt then reported to ER because of worsening sxs.  In the ER, CXR was obtained that showed no focal infiltrate. Pt was given multiple CAT treatments as well as IV solumedrol with mild improvement in sxs, though with still persistent increased WOB. O2 sats remained > 94% on RA.  Hgb stable @ 10.9 (post transfusion CBC from last hospitalization @ 9.9). Trop negative x 1.   Patient Active Problem List   Diagnosis Date Noted  . Respiratory distress 09/14/2013  . Acute bronchitis 09/13/2013  . ARF (acute renal failure) 08/29/2013  . Rectus sheath hematoma 08/27/2013  . Leukocytosis, unspecified 08/14/2013  . Mitral valve regurgitation 12/02/2012  . Medicare annual wellness visit, initial  10/29/2012  . Insomnia   . Atrial fibrillation   . Hyperlipidemia   . Hypertension   . Hypothyroidism   . Knee joint replacement status   . Breast mass   . Benign head tremor    Past Medical History: Past Medical History  Diagnosis Date  . Hyperlipidemia   . Hypertension   . Hypothyroidism     borderline  . Osteoarthritis of knee     s/p replacement  . Breast mass     left  . Benign head tremor   . Atrial fibrillation 2010    s/p DC cardioversion. on coumadin  . Insomnia   . GERD (gastroesophageal reflux disease)   . Chronic bronchitis     "used to get it alot; not so much anymore" (08/27/2013)  . Sleep apnea     "does not wear mask" (08/27/2013)  . Rectus sheath hematoma 08/27/2013    R after bridging with lovenox  . H/O hiatal hernia     moderate sized/notes  . History of diverticulitis of colon 08/2013    sigmoid with possible microperf/abscess s/p hospitalization complicated by rectus sheath hematoma during lovenox bridge, leading to ARF from hematoma compression on kidney    Past Surgical History: Past Surgical History  Procedure Laterality Date  . Replacement total knee Bilateral 2005  . Appendectomy  1947  . Dilation and curettage of uterus    . Cholecystectomy  1980  . Cardiac catheterization      [facility];Dr. Clayborn Bigness  . Dexa  10/2012    WNL  . Tonsillectomy    . Joint replacement    . Abdominal exploration surgery  1971    "to see if  I needed hysterectomy; dr thought qthing looked too good so he didn't do a hysterectomy at this time" (08/27/2013)  . Vaginal hysterectomy  1971    "2 wks after 1st dr said it was ok" (08/27/2013)  . Cardioversion      Ar[facility] Endo 08/27/2013  . Percutaneous nephrostomy  08/2014    ARF from hematoma compression    Social History: History   Social History  . Marital Status: Widowed    Spouse Name: N/A    Number of Children: N/A  . Years of Education: N/A   Social History Main Topics  . Smoking status: Never Smoker   .  Smokeless tobacco: Never Used  . Alcohol Use: No  . Drug Use: No  . Sexual Activity: No   Other Topics Concern  . None   Social History Narrative   Caffeine: 1 cup coffee/day   Lives alone in senior living.  Daughter in [place]   Occupation: retired, Scientist, research (medical)   Edu: HS   Activity: walks the track   Diet:     Family History: Family History  Problem Relation Age of Onset  . CAD Mother     angina  . Cancer Paternal Grandfather     ?  Marland Kitchen Alzheimer's disease Father     died from PNA  . Diabetes Mother   . Stroke Mother 59    Allergies: Allergies  Allergen Reactions  . Paxil [Paroxetine Hcl] Other (See Comments)    Shaking   . Clindamycin/Lincomycin Other (See Comments)    unknown  . Codeine Other (See Comments)    unknown  . Doxycycline Other (See Comments)    unknown  . Promethazine Other (See Comments)    unknown  . Sulfa Antibiotics Hives  . Valium Other (See Comments)    unknown    Current Facility-Administered Medications  Medication Dose Route Frequency Provider Last Rate Last Dose  . acetaminophen (TYLENOL) tablet 1,000 mg  1,000 mg Oral Q6H PRN Shanda Howells, MD      . Derrill Memo ON 09/15/2013] albuterol (PROVENTIL) (2.5 MG/3ML) 0.083% nebulizer solution 2.5 mg  2.5 mg Nebulization Q4H Shanda Howells, MD      . albuterol (PROVENTIL) (2.5 MG/3ML) 0.083% nebulizer solution 2.5 mg  2.5 mg Nebulization Q2H PRN Shanda Howells, MD      . Derrill Memo ON 09/15/2013] amLODipine-benazepril (LOTREL) 10-40 MG per capsule 1 capsule  1 capsule Oral Daily Shanda Howells, MD      . Derrill Memo ON 09/15/2013] aspirin chewable tablet 81 mg  81 mg Oral Daily Shanda Howells, MD      . clonazePAM [facility]) tablet 0.5 mg  0.5 mg Oral QHS Shanda Howells, MD      . flecainide [facility]) tablet 100 mg  100 mg Oral BID Shanda Howells, MD      . Derrill Memo ON 09/15/2013] ipratropium (ATROVENT) nebulizer solution 0.5 mg  0.5 mg Nebulization Q4H Shanda Howells, MD      . ipratropium (ATROVENT) nebulizer solution 0.5 mg   0.5 mg Nebulization Q2H PRN Shanda Howells, MD      . Derrill Memo ON 09/15/2013] levothyroxine (SYNTHROID, LEVOTHROID) tablet 25 mcg  25 mcg Oral Daily Shanda Howells, MD      . methylPREDNISolone sodium succinate (SOLU-MEDROL) 125 mg/2 mL injection 125 mg  125 mg Intravenous Q6H Shanda Howells, MD      . Derrill Memo ON 09/15/2013] pantoprazole (PROTONIX) EC tablet 20 mg  20 mg Oral Daily Shanda Howells, MD      . Derrill Memo ON 09/15/2013]  simvastatin (ZOCOR) tablet 10 mg  10 mg Oral QPM Shanda Howells, MD      . sodium chloride 0.9 % injection 3 mL  3 mL Intravenous Q12H Shanda Howells, MD       Current Outpatient Prescriptions  Medication Sig Dispense Refill  . acetaminophen (TYLENOL) 500 MG tablet Take 1,000 mg by mouth every 6 (six) hours as needed for moderate pain.      Marland Kitchen amLODipine-benazepril (LOTREL) 10-40 MG per capsule Take 1 capsule by mouth daily.  90 capsule  0  . amoxicillin-clavulanate (AUGMENTIN) 875-125 MG per tablet Take 1 tablet by mouth 2 (two) times daily.  20 tablet  0  . aspirin 81 MG chewable tablet Chew 1 tablet (81 mg total) by mouth daily.  30 tablet  0  . clonazePAM (KLONOPIN) 0.5 MG tablet Take 0.5 mg by mouth at bedtime.      . flecainide (TAMBOCOR) 100 MG tablet Take 1 tablet (100 mg total) by mouth 2 (two) times daily.  180 tablet  0  . furosemide (LASIX) 40 MG tablet Take 1 tablet (40 mg total) by mouth daily. 2nd dose prn  90 tablet  3  . lansoprazole (PREVACID) 15 MG capsule Take 1 capsule (15 mg total) by mouth daily.  90 capsule  0  . levothyroxine (SYNTHROID, LEVOTHROID) 25 MCG tablet Take 1 tablet (25 mcg total) by mouth daily.  90 tablet  1  . metoprolol (LOPRESSOR) 50 MG tablet Take 1 tablet (50 mg total) by mouth 2 (two) times daily.  180 tablet  0  . simvastatin (ZOCOR) 20 MG tablet Take 0.5 tablets (10 mg total) by mouth every evening.  45 tablet  0   Review Of Systems: 12 point ROS negative except as noted above in HPI.  Physical Exam: Filed Vitals:   09/14/13 2038   BP: 155/55  Pulse: 81  Temp: 99.8 F (37.7 C)    General: alert, cooperative and mild distress HEENT: PERRLA and extra ocular movement intact Heart: S1, S2 normal, no murmur, rub or gallop, regular rate and rhythm Lungs: expiratory wheezes and mildly labored breathing  Abdomen: abdomen is soft without significant tenderness, masses, organomegaly or guarding Extremities: 2+ edema bilaterally  Skin:no rashes, no ecchymoses Neurology: normal without focal findings  Labs and Imaging: Lab Results  Component Value Date/Time   NA 138 09/14/2013  9:32 PM   K 3.6* 09/14/2013  9:32 PM   CL 99 09/14/2013  9:32 PM   CO2 24 09/14/2013  9:32 PM   BUN 15 09/14/2013  9:32 PM   CREATININE 0.88 09/14/2013  9:32 PM   CREATININE 1.14* 08/12/2013  6:53 PM   GLUCOSE 131* 09/14/2013  9:32 PM   Lab Results  Component Value Date   WBC 5.0 09/14/2013   HGB 10.9* 09/14/2013   HCT 34.5* 09/14/2013   MCV 88.9 09/14/2013   PLT 283 09/14/2013    Dg Chest Port 1 View  09/14/2013   CLINICAL DATA:  Shortness of breath and cough  EXAM: PORTABLE CHEST - 1 VIEW  COMPARISON:  None.  FINDINGS: The heart size and mediastinal contours are within normal limits. Both lungs are clear. The visualized skeletal structures are unremarkable.  IMPRESSION: No active disease.   Electronically Signed   By: Inez Catalina M.D.   On: 09/14/2013 21:18     Assessment and Plan: ALGENE HOLLANDER is a 73 y.o. year old female presenting with resp distress wheezing   Resp: likely secondary to underlying viral  illness with secondary bronchospasm. No known prior hx/o obstructive lung disease in the past. Will proceed down asthma exacerbation treatment pathway. Scheduled solumedrol and nebs. Prn supplemental O2. No infiltrate on CXR. Will hold on abx. There may be a mild component of volume overload to presentation given clinical exam. Will mildly diurese. Watch renal function closely. Hold beta blocker to decrease bronchospasm. Check ABG. Follow  resp status closely. Consider IV mag if necessary. Touch base with pharmacy prior to use given flecanide use in setting of afib. Start on tamiflu. Resp virus panel.   CV: Hemodynamically stable. Continue anti-arrhythmic. CHADS-VAsc score 2-3. Holding anticoagulation given recent abd wall hematoma. Mildly volume overloaded on exam. Check pro BNP. Gentle diuresis, watching renal function closely in setting of perc nephrostomy tube and recent renal obstruction. Hold metoprolol given bronchospasm and wheezing. Continue baby ASA. Follow HR closely in setting of albuterol/atrovent use.    Heme: Hgb stable from hospital discharge. No active signs of bleeding. Clinically resolving rectus sheath hematoma. Follow CBCs closely.   Renal: Cr stable @ 1.14 today s/p nephrectomy tube. Was pending f/u CT scan and possible nephrectomy tube removal from urology. Consider formal urology consult in am for this issue.   FEN/GI: ADAT as resp status tolerates. PPI.   Prophylaxis: SCDs. Holding anticoagulation.  Disposition: pending furhter evaluation Code Status: Full Code.        Shanda Howells MD  Pager: [phone]

## 2013-09-15 ENCOUNTER — Encounter (HOSPITAL_COMMUNITY): Payer: Self-pay | Admitting: *Deleted

## 2013-09-15 ENCOUNTER — Telehealth: Payer: Self-pay | Admitting: Family Medicine

## 2013-09-15 DIAGNOSIS — E039 Hypothyroidism, unspecified: Secondary | ICD-10-CM

## 2013-09-15 DIAGNOSIS — I4891 Unspecified atrial fibrillation: Secondary | ICD-10-CM

## 2013-09-15 DIAGNOSIS — R0609 Other forms of dyspnea: Secondary | ICD-10-CM

## 2013-09-15 DIAGNOSIS — R0989 Other specified symptoms and signs involving the circulatory and respiratory systems: Secondary | ICD-10-CM

## 2013-09-15 DIAGNOSIS — J45902 Unspecified asthma with status asthmaticus: Secondary | ICD-10-CM

## 2013-09-15 DIAGNOSIS — J209 Acute bronchitis, unspecified: Secondary | ICD-10-CM

## 2013-09-15 DIAGNOSIS — I10 Essential (primary) hypertension: Secondary | ICD-10-CM

## 2013-09-15 LAB — RESPIRATORY VIRUS PANEL
Adenovirus: NOT DETECTED
INFLUENZA A: NOT DETECTED
Influenza A H1: NOT DETECTED
Influenza A H3: NOT DETECTED
Influenza B: NOT DETECTED
Metapneumovirus: NOT DETECTED
PARAINFLUENZA 1 A: NOT DETECTED
PARAINFLUENZA 3 A: NOT DETECTED
Parainfluenza 2: NOT DETECTED
RESPIRATORY SYNCYTIAL VIRUS B: DETECTED — AB
Respiratory Syncytial Virus A: NOT DETECTED
Rhinovirus: NOT DETECTED

## 2013-09-15 LAB — CBC
HEMATOCRIT: 32.5 % — AB (ref 36.0–46.0)
Hemoglobin: 10.5 g/dL — ABNORMAL LOW (ref 12.0–15.0)
MCH: 28.2 pg (ref 26.0–34.0)
MCHC: 32.3 g/dL (ref 30.0–36.0)
MCV: 87.4 fL (ref 78.0–100.0)
Platelets: 315 10*3/uL (ref 150–400)
RBC: 3.72 MIL/uL — AB (ref 3.87–5.11)
RDW: 16.8 % — AB (ref 11.5–15.5)
WBC: 5.5 10*3/uL (ref 4.0–10.5)

## 2013-09-15 LAB — COMPREHENSIVE METABOLIC PANEL
ALBUMIN: 3.3 g/dL — AB (ref 3.5–5.2)
ALK PHOS: 135 U/L — AB (ref 39–117)
ALT: 20 U/L (ref 0–35)
AST: 19 U/L (ref 0–37)
BILIRUBIN TOTAL: 0.7 mg/dL (ref 0.3–1.2)
BUN: 16 mg/dL (ref 6–23)
CHLORIDE: 97 meq/L (ref 96–112)
CO2: 20 mEq/L (ref 19–32)
Calcium: 8.7 mg/dL (ref 8.4–10.5)
Creatinine, Ser: 0.82 mg/dL (ref 0.50–1.10)
GFR calc Af Amer: 80 mL/min — ABNORMAL LOW (ref >90)
GFR calc non Af Amer: 69 mL/min — ABNORMAL LOW (ref >90)
Glucose, Bld: 188 mg/dL — ABNORMAL HIGH (ref 70–99)
POTASSIUM: 3.4 meq/L — AB (ref 3.7–5.3)
Sodium: 137 mEq/L (ref 137–147)
Total Protein: 6.8 g/dL (ref 6.0–8.3)

## 2013-09-15 LAB — PRO B NATRIURETIC PEPTIDE: PRO B NATRI PEPTIDE: 357.3 pg/mL — AB (ref 0–125)

## 2013-09-15 MED ORDER — FUROSEMIDE 10 MG/ML IJ SOLN
20.0000 mg | Freq: Two times a day (BID) | INTRAMUSCULAR | Status: DC
  Administered 2013-09-15 – 2013-09-16 (×3): 20 mg via INTRAVENOUS
  Filled 2013-09-15 (×4): qty 2

## 2013-09-15 MED ORDER — OSELTAMIVIR PHOSPHATE 75 MG PO CAPS
75.0000 mg | ORAL_CAPSULE | Freq: Two times a day (BID) | ORAL | Status: DC
  Administered 2013-09-15 (×3): 75 mg via ORAL
  Filled 2013-09-15 (×5): qty 1

## 2013-09-15 MED ORDER — PHENOL 1.4 % MT LIQD
1.0000 | OROMUCOSAL | Status: DC | PRN
  Administered 2013-09-16: 17:00:00 1 via OROMUCOSAL
  Filled 2013-09-15: qty 177

## 2013-09-15 MED ORDER — BENAZEPRIL HCL 40 MG PO TABS
40.0000 mg | ORAL_TABLET | Freq: Every day | ORAL | Status: DC
  Administered 2013-09-15 – 2013-09-19 (×5): 40 mg via ORAL
  Filled 2013-09-15 (×5): qty 1

## 2013-09-15 MED ORDER — IPRATROPIUM-ALBUTEROL 0.5-2.5 (3) MG/3ML IN SOLN
3.0000 mL | RESPIRATORY_TRACT | Status: DC
  Administered 2013-09-15 – 2013-09-18 (×16): 3 mL via RESPIRATORY_TRACT
  Filled 2013-09-15 (×14): qty 3

## 2013-09-15 MED ORDER — BUDESONIDE 0.25 MG/2ML IN SUSP
0.2500 mg | Freq: Two times a day (BID) | RESPIRATORY_TRACT | Status: DC
  Administered 2013-09-16 – 2013-09-18 (×5): 0.25 mg via RESPIRATORY_TRACT
  Filled 2013-09-15 (×8): qty 2

## 2013-09-15 MED ORDER — AMLODIPINE BESYLATE 10 MG PO TABS
10.0000 mg | ORAL_TABLET | Freq: Every day | ORAL | Status: DC
  Administered 2013-09-15 – 2013-09-19 (×5): 10 mg via ORAL
  Filled 2013-09-15 (×6): qty 1

## 2013-09-15 NOTE — Progress Notes (Signed)
UR completed. Patient changed to inpatient r/t requiring IV lasix and IV solumedrol scheduled.

## 2013-09-15 NOTE — Telephone Encounter (Signed)
Noted. Thanks.

## 2013-09-15 NOTE — Telephone Encounter (Signed)
Called daughter to ensure we did her mothers referral to Urology for [facility] and she asked me to let you know that her mother is in [place]. She was having trouble breathing last night so Corrinna started to driive her to hospital and then got scared and stopped a Psychologist, occupational and they called 911 and transported her to [place] where she is now.

## 2013-09-15 NOTE — Progress Notes (Signed)
Patient seen and examined. Admitted after midnight secondary to acute resp distress due to bronchitis/early PNA and concerns for underlying COPD. Patient presented with tachypnea, decrease air movement and wheezing. Patient has no prior hx of COPD but has never been evaluated for it either. This is her third admission due to resp distress.  Plan: will continue solumedrol and antibiotics -start pulmicort -continue PRN nebulizer -will set up appointment with [facility] pulmonology for PFT's after acute stabilization of her symptoms.  Mckenzie Frank [phone]

## 2013-09-15 NOTE — Progress Notes (Signed)
Patient evaluated for community based chronic disease management services with [facility] as a benefit of patient's [employer]. Spoke with patient and daughter Karyme Mcconathy [phone]) at bedside to explain [facility] services.  Services have been accepted.  This is the patients third admission for acute respiratory distress.  She and her family would benefit from early symptom recognition and a acute intervention plan.  Spoke with attending MD and it has been planned for patient to be referred to [facility] for outpatient follow up.  Patient would like her daughter to be the back up contact for her mother.  She is a new [employer] member formally [place].  Requested a [employer] Liaison to visit for [employer] services overview.  Patient does not understand her plans medication copays or consultation referral process.  Patient will receive a post discharge transition of care call and will be evaluated for monthly home visits for assessments and disease process education.  Left contact information and [facility] literature at bedside. Made Inpatient Case Manager aware that [place] [facility] following. Of note, [facility] Management services does not replace or interfere with any services that are arranged by inpatient case management or social work.  For additional questions or referrals please contact Corliss Blacker BSN RN [facility] Liaison at [phone].

## 2013-09-16 ENCOUNTER — Inpatient Hospital Stay (HOSPITAL_COMMUNITY): Payer: Medicare HMO

## 2013-09-16 DIAGNOSIS — E785 Hyperlipidemia, unspecified: Secondary | ICD-10-CM

## 2013-09-16 DIAGNOSIS — N1330 Unspecified hydronephrosis: Secondary | ICD-10-CM

## 2013-09-16 DIAGNOSIS — J210 Acute bronchiolitis due to respiratory syncytial virus: Secondary | ICD-10-CM

## 2013-09-16 DIAGNOSIS — S301XXA Contusion of abdominal wall, initial encounter: Secondary | ICD-10-CM

## 2013-09-16 LAB — COMPREHENSIVE METABOLIC PANEL WITH GFR
ALT: 17 U/L (ref 0–35)
AST: 15 U/L (ref 0–37)
Albumin: 3.1 g/dL — ABNORMAL LOW (ref 3.5–5.2)
Alkaline Phosphatase: 117 U/L (ref 39–117)
BUN: 21 mg/dL (ref 6–23)
CO2: 22 meq/L (ref 19–32)
Calcium: 8.7 mg/dL (ref 8.4–10.5)
Chloride: 100 meq/L (ref 96–112)
Creatinine, Ser: 0.79 mg/dL (ref 0.50–1.10)
GFR calc Af Amer: >90 mL/min
GFR calc non Af Amer: 80 mL/min — ABNORMAL LOW
Glucose, Bld: 218 mg/dL — ABNORMAL HIGH (ref 70–99)
Potassium: 3.5 meq/L — ABNORMAL LOW (ref 3.7–5.3)
Sodium: 141 meq/L (ref 137–147)
Total Bilirubin: 0.6 mg/dL (ref 0.3–1.2)
Total Protein: 6.3 g/dL (ref 6.0–8.3)

## 2013-09-16 LAB — CBC
HCT: 32 % — ABNORMAL LOW (ref 36.0–46.0)
Hemoglobin: 10 g/dL — ABNORMAL LOW (ref 12.0–15.0)
MCH: 28 pg (ref 26.0–34.0)
MCHC: 31.3 g/dL (ref 30.0–36.0)
MCV: 89.6 fL (ref 78.0–100.0)
Platelets: 336 10*3/uL (ref 150–400)
RBC: 3.57 MIL/uL — ABNORMAL LOW (ref 3.87–5.11)
RDW: 17.1 % — ABNORMAL HIGH (ref 11.5–15.5)
WBC: 13.8 10*3/uL — ABNORMAL HIGH (ref 4.0–10.5)

## 2013-09-16 MED ORDER — LEVALBUTEROL HCL 0.63 MG/3ML IN NEBU
0.6300 mg | INHALATION_SOLUTION | Freq: Four times a day (QID) | RESPIRATORY_TRACT | Status: DC | PRN
  Administered 2013-09-18 (×2): 0.63 mg via RESPIRATORY_TRACT
  Filled 2013-09-16 (×2): qty 3

## 2013-09-16 MED ORDER — GUAIFENESIN-DM 100-10 MG/5ML PO SYRP
5.0000 mL | ORAL_SOLUTION | ORAL | Status: DC | PRN
  Administered 2013-09-17 – 2013-09-19 (×4): 5 mL via ORAL
  Filled 2013-09-16 (×4): qty 5

## 2013-09-16 MED ORDER — PANTOPRAZOLE SODIUM 40 MG PO TBEC
40.0000 mg | DELAYED_RELEASE_TABLET | Freq: Every day | ORAL | Status: DC
  Administered 2013-09-17 – 2013-09-19 (×3): 40 mg via ORAL
  Filled 2013-09-16: qty 1

## 2013-09-16 MED ORDER — PREDNISONE 20 MG PO TABS
40.0000 mg | ORAL_TABLET | Freq: Two times a day (BID) | ORAL | Status: DC
  Administered 2013-09-16 – 2013-09-19 (×6): 40 mg via ORAL
  Filled 2013-09-16 (×8): qty 2

## 2013-09-16 MED ORDER — FUROSEMIDE 8 MG/ML PO SOLN
40.0000 mg | Freq: Two times a day (BID) | ORAL | Status: DC
  Administered 2013-09-16 – 2013-09-17 (×2): 40 mg via ORAL
  Filled 2013-09-16 (×4): qty 5

## 2013-09-16 NOTE — Progress Notes (Signed)
[facility] PROGRESS NOTE  Mckenzie Frank HOZ:[id number] DOB: 1940-12-10 DOA: 09/14/2013 PCP: Ria Bush, MD  Assessment/Plan: 1-acute resp failure with hypoxia due to RSV B and bronchitis/early PNA: -improving. -continue oxygen supplementation and support care -continue PRN xopenex -continue pulmicort -continue antibiotics -start steroids tapering  2-Atrial fibrillation: stable. -HR controlled -CHADS score 3 -continue antiarrhythmic  -when ok per urology will resume coumadin  3-Rectus sheath hematoma: no further episodes of bleeding or dropping on Hgb appreciated. -will check CT w/o contrast for follow up -coumadin still on hold  4-hydronephrosis: status post nephrostomy tube. -CT w/o contrast for further evaluation and decision on tube removal -dressing changes every other day while tube in place (dry dressing).  5-HLD: continue statins  6-hypothyroidism: continue synthroid  DVT: SCD's  Code Status: Full Family Communication: daughter at bedside Disposition Plan: home when medically stable   Consultants:  Urology (Dr. Jeffie Pollock)  Procedures:  See below for x-ray reports   Antibiotics:  levaquin   HPI/Subjective: Afebrile, no CP, improved SOB.  Objective: Filed Vitals:   09/16/13 1326  BP: 118/73  Pulse: 88  Temp: 98.4 F (36.9 C)  Resp: 18    Intake/Output Summary (Last 24 hours) at 09/16/13 1514 Last data filed at 09/16/13 1239  Gross per 24 hour  Intake    920 ml  Output   1375 ml  Net   -455 ml   Filed Weights   09/15/13 0158 09/16/13 0523  Weight: 130.273 kg (287 lb 3.2 oz) 129.4 kg (285 lb 4.4 oz)    Exam:   General:  NAD, afebrile, no CP. Breathing better  Cardiovascular: s1 and S2, no rubs or gallops  Respiratory: slight exp wheezing, scattered rhonchi, no crackles. Improved air movement  Abdomen: soft, NT, ND, positive BS. Nephrostomy tube still in place. No signs of drainage or infection around insertion  area.  Musculoskeletal: 1+ edema bilaterally. No pain, no joint swelling  Data Reviewed: Basic Metabolic Panel:  Recent Labs Lab 09/09/13 1708 09/14/13 2132 09/15/13 0344 09/16/13 0510  NA 140 138 137 141  K 4.0 3.6* 3.4* 3.5*  CL 105 99 97 100  CO2 _0 GLUCOSE 111* 131* 188* 218*  BUN _1 CREATININE 0.9 0.88 0.82 0.79  CALCIUM 8.8 8.4 8.7 8.7   Liver Function Tests:  Recent Labs Lab 09/15/13 0344 09/16/13 0510  AST 19 15  ALT 20 17  ALKPHOS 135* 117  BILITOT 0.7 0.6  PROT 6.8 6.3  ALBUMIN 3.3* 3.1*   CBC:  Recent Labs Lab 09/09/13 1708 09/14/13 2132 09/15/13 0344 09/16/13 0510  WBC 8.6 5.0 5.5 13.8*  NEUTROABS 6.1 2.9  --   --   HGB 9.9* 10.9* 10.5* 10.0*  HCT 30.4* 34.5* 32.5* 32.0*  MCV 86.8 88.9 87.4 89.6  PLT 409.0* 283 315 336   Cardiac Enzymes:  Recent Labs Lab 09/14/13 2132  TROPONINI <0.30   BNP (last 3 results)  Recent Labs  09/14/13 2132  PROBNP 357.3*    Recent Results (from the past 240 hour(s))  RESPIRATORY VIRUS PANEL     Status: Abnormal   Collection Time    09/15/13  3:13 AM      Result Value Range Status   Source - RVPAN NASAL SWAB   Corrected   Comment: CORRECTED ON 01/14 AT 2128: PREVIOUSLY REPORTED AS NASAL SWAB   Respiratory Syncytial Virus A NOT DETECTED   Final   Respiratory Syncytial Virus B DETECTED (*)  Final  Influenza A NOT DETECTED   Final   Influenza B NOT DETECTED   Final   Parainfluenza 1 NOT DETECTED   Final   Parainfluenza 2 NOT DETECTED   Final   Parainfluenza 3 NOT DETECTED   Final   Metapneumovirus NOT DETECTED   Final   Rhinovirus NOT DETECTED   Final   Adenovirus NOT DETECTED   Final   Influenza A H1 NOT DETECTED   Final   Influenza A H3 NOT DETECTED   Final   Comment: (NOTE)           Normal Reference Range for each Analyte: NOT DETECTED     Testing performed using the Luminex xTAG Respiratory Viral Panel test     kit.     This test was developed and its performance  characteristics determined     by [facility]. It has not been cleared or approved by the [place]     Food and Drug Administration. This test is used for clinical purposes.     It should not be regarded as investigational or for research. This     laboratory is certified under the [place] (CLIA) as qualified to perform high complexity     clinical laboratory testing.     Performed at [facility]     Studies: Dg Chest Port 1 View  09/14/2013   CLINICAL DATA:  Shortness of breath and cough  EXAM: PORTABLE CHEST - 1 VIEW  COMPARISON:  None.  FINDINGS: The heart size and mediastinal contours are within normal limits. Both lungs are clear. The visualized skeletal structures are unremarkable.  IMPRESSION: No active disease.   Electronically Signed   By: Inez Catalina M.D.   On: 09/14/2013 21:18    Scheduled Meds: . amLODipine  10 mg Oral Daily   And  . benazepril  40 mg Oral Daily  . aspirin  81 mg Oral Daily  . budesonide (PULMICORT) nebulizer solution  0.25 mg Nebulization BID  . clonazePAM  0.5 mg Oral QHS  . flecainide  100 mg Oral BID  . furosemide  40 mg Oral BID  . ipratropium-albuterol  3 mL Nebulization Q4H  . levothyroxine  25 mcg Oral QAC breakfast  . [START ON 09/17/2013] pantoprazole  40 mg Oral Daily  . predniSONE  40 mg Oral BID WC  . simvastatin  10 mg Oral QPM  . sodium chloride  3 mL Intravenous Q12H    Time spent: >30 minutes    Virgina Deakins  [facility] Pager [phone]. If 7PM-7AM, please contact night-coverage at [email or web address], password [facility] 09/16/2013, 3:14 PM  LOS: 2 days

## 2013-09-16 NOTE — Progress Notes (Signed)
[facility]  Patient Status: Active (receiving services up to time of hospitalization)  [facility] is providing the following services: RN and PT  If patient discharges after hours, please call [phone].   Mckenzie Frank 09/16/2013, 5:46 PM

## 2013-09-16 NOTE — Progress Notes (Signed)
Patient ID: Mckenzie Frank, female   DOB: 12-19-1940, 73 y.o.   MRN: [id number]    I am aware of the admission.      Mckenzie Frank was to have a repeat CT AP without contrast to assess the hematoma to see if we could consider NT removal or possible antegrade stent placement.    Please order the CT when medically appropriate.  Dr. Jeffie Pollock

## 2013-09-17 DIAGNOSIS — D72829 Elevated white blood cell count, unspecified: Secondary | ICD-10-CM

## 2013-09-17 LAB — CBC
HEMATOCRIT: 32 % — AB (ref 36.0–46.0)
HEMOGLOBIN: 10.2 g/dL — AB (ref 12.0–15.0)
MCH: 28.4 pg (ref 26.0–34.0)
MCHC: 31.9 g/dL (ref 30.0–36.0)
MCV: 89.1 fL (ref 78.0–100.0)
Platelets: 337 10*3/uL (ref 150–400)
RBC: 3.59 MIL/uL — ABNORMAL LOW (ref 3.87–5.11)
RDW: 17 % — ABNORMAL HIGH (ref 11.5–15.5)
WBC: 16.1 10*3/uL — ABNORMAL HIGH (ref 4.0–10.5)

## 2013-09-17 LAB — COMPREHENSIVE METABOLIC PANEL
ALBUMIN: 3.2 g/dL — AB (ref 3.5–5.2)
ALT: 19 U/L (ref 0–35)
AST: 15 U/L (ref 0–37)
Alkaline Phosphatase: 111 U/L (ref 39–117)
BUN: 26 mg/dL — ABNORMAL HIGH (ref 6–23)
CO2: 25 mEq/L (ref 19–32)
Calcium: 8.6 mg/dL (ref 8.4–10.5)
Chloride: 99 mEq/L (ref 96–112)
Creatinine, Ser: 0.81 mg/dL (ref 0.50–1.10)
GFR calc Af Amer: 81 mL/min — ABNORMAL LOW (ref >90)
GFR calc non Af Amer: 70 mL/min — ABNORMAL LOW (ref >90)
GLUCOSE: 163 mg/dL — AB (ref 70–99)
Potassium: 3.7 mEq/L (ref 3.7–5.3)
SODIUM: 140 meq/L (ref 137–147)
TOTAL PROTEIN: 6.3 g/dL (ref 6.0–8.3)
Total Bilirubin: 0.6 mg/dL (ref 0.3–1.2)

## 2013-09-17 MED ORDER — LEVOFLOXACIN 750 MG PO TABS
750.0000 mg | ORAL_TABLET | Freq: Every day | ORAL | Status: DC
  Administered 2013-09-17 – 2013-09-19 (×3): 750 mg via ORAL
  Filled 2013-09-17 (×3): qty 1

## 2013-09-17 MED ORDER — FUROSEMIDE 40 MG PO TABS
40.0000 mg | ORAL_TABLET | Freq: Two times a day (BID) | ORAL | Status: DC
  Administered 2013-09-17 – 2013-09-19 (×4): 40 mg via ORAL
  Filled 2013-09-17 (×6): qty 1

## 2013-09-17 NOTE — Progress Notes (Signed)
[facility] PROGRESS NOTE  Mckenzie Frank PPJ:[id number] DOB: Jan 06, 1941 DOA: 09/14/2013 PCP: Ria Bush, MD  Assessment/Plan: 1-acute resp failure with hypoxia due to RSV B and bronchitis/early PNA: -slowly but steadily improving. -continue oxygen supplementation and support care -continue PRN xopenex -continue pulmicort and antibiotics -start steroids tapering -slight leukocytosis in part due to steroids  2-Atrial fibrillation: stable. -HR controlled -CHADS score 3 -continue antiarrhythmic  -when ok per urology will resume coumadin  3-Rectus sheath hematoma: no further episodes of bleeding or dropping on Hgb appreciated. -CT demonstrating improvement, but still significant pressure on her ureter and bladder from hematoma -coumadin still on hold  4-hydronephrosis: status post nephrostomy tube. -CT w/o contrast reveal resolution of hydronephrosis -due to still presence of significant pressure of her ureter and bladder unable to remove nephrostomy tube  -dressing changes every other day while tube in place (dry dressing).  5-HLD: continue statins  6-hypothyroidism: continue synthroid  DVT: SCD's  Code Status: Full Family Communication: daughter at bedside Disposition Plan: home when medically stable   Consultants:  Urology (Dr. Jeffie Pollock)  Procedures:  See below for x-ray reports   Antibiotics:  levaquin   HPI/Subjective: Afebrile,breathing is better. Will check oxygen needs.  Objective: Filed Vitals:   09/17/13 2049  BP: 153/53  Pulse: 95  Temp: 98.1 F (36.7 C)  Resp: 18    Intake/Output Summary (Last 24 hours) at 09/17/13 2308 Last data filed at 09/17/13 2200  Gross per 24 hour  Intake    900 ml  Output   2795 ml  Net  -1895 ml   Filed Weights   09/15/13 0158 09/16/13 0523 09/17/13 0356  Weight: 130.273 kg (287 lb 3.2 oz) 129.4 kg (285 lb 4.4 oz) 130.001 kg (286 lb 9.6 oz)    Exam:   General:  NAD, afebrile, no CP. Breathing  better  Cardiovascular: s1 and S2, no rubs or gallops  Respiratory: slight exp wheezing, scattered rhonchi, no crackles. Cont to Improve air movement  Abdomen: soft, NT, ND, positive BS. Nephrostomy tube still in place. No signs of drainage or infection around insertion area.  Musculoskeletal: 1+ edema bilaterally. No pain, no joint swelling  Data Reviewed: Basic Metabolic Panel:  Recent Labs Lab 09/14/13 2132 09/15/13 0344 09/16/13 0510 09/17/13 0520  NA 138 137 141 140  K 3.6* 3.4* 3.5* 3.7  CL 99 97 100 99  CO2 $Re'24 20 22 25  'HOs$ GLUCOSE 131* 188* 218* 163*  BUN $Re'15 16 21 'uAa$ 26*  CREATININE 0.88 0.82 0.79 0.81  CALCIUM 8.4 8.7 8.7 8.6   Liver Function Tests:  Recent Labs Lab 09/15/13 0344 09/16/13 0510 09/17/13 0520  AST $Re'19 15 15  'BUL$ ALT $R'20 17 19  'NF$ ALKPHOS 135* 117 111  BILITOT 0.7 0.6 0.6  PROT 6.8 6.3 6.3  ALBUMIN 3.3* 3.1* 3.2*   CBC:  Recent Labs Lab 09/14/13 2132 09/15/13 0344 09/16/13 0510 09/17/13 0520  WBC 5.0 5.5 13.8* 16.1*  NEUTROABS 2.9  --   --   --   HGB 10.9* 10.5* 10.0* 10.2*  HCT 34.5* 32.5* 32.0* 32.0*  MCV 88.9 87.4 89.6 89.1  PLT 283 315 336 337   Cardiac Enzymes:  Recent Labs Lab 09/14/13 2132  TROPONINI <0.30   BNP (last 3 results)  Recent Labs  09/14/13 2132  PROBNP 357.3*    Recent Results (from the past 240 hour(s))  RESPIRATORY VIRUS PANEL     Status: Abnormal   Collection Time    09/15/13  3:13 AM  Result Value Range Status   Source - RVPAN NASAL SWAB   Corrected   Comment: CORRECTED ON 01/14 AT 2128: PREVIOUSLY REPORTED AS NASAL SWAB   Respiratory Syncytial Virus A NOT DETECTED   Final   Respiratory Syncytial Virus B DETECTED (*)  Final   Influenza A NOT DETECTED   Final   Influenza B NOT DETECTED   Final   Parainfluenza 1 NOT DETECTED   Final   Parainfluenza 2 NOT DETECTED   Final   Parainfluenza 3 NOT DETECTED   Final   Metapneumovirus NOT DETECTED   Final   Rhinovirus NOT DETECTED   Final   Adenovirus NOT  DETECTED   Final   Influenza A H1 NOT DETECTED   Final   Influenza A H3 NOT DETECTED   Final   Comment: (NOTE)           Normal Reference Range for each Analyte: NOT DETECTED     Testing performed using the Luminex xTAG Respiratory Viral Panel test     kit.     This test was developed and its performance characteristics determined     by [facility]. It has not been cleared or approved by the [place]     Food and Drug Administration. This test is used for clinical purposes.     It should not be regarded as investigational or for research. This     laboratory is certified under the Clinical Laboratory Improvement     Amendments of 1988 (CLIA) as qualified to perform high complexity     clinical laboratory testing.     Performed at [facility]     Studies: Ct Abdomen Pelvis Wo Contrast  09/16/2013   CLINICAL DATA:  Evaluate for possible drain removal.  EXAM: CT ABDOMEN AND PELVIS WITHOUT CONTRAST  TECHNIQUE: Multidetector CT imaging of the abdomen and pelvis was performed following the standard protocol without intravenous contrast.  COMPARISON:  CT of the abdomen and pelvis 08/30/2013.  FINDINGS: Lung Bases: Small to moderate sized hiatal hernia.  Abdomen/Pelvis: Status post cholecystectomy. The unenhanced appearance of the liver, pancreas, spleen and bilateral adrenal glands is unremarkable. In the anterior aspect of the lower pole of the left kidney there is a 7 mm fatty attenuation lesion, suspicious for a small angiomyolipoma. Sub cm high density lesion in the lateral aspect of the upper pole of the left kidney may represent a proteinaceous or hemorrhagic cyst. On the right, there is a right sided percutaneous nephrostomy tube in position with the tip of the tube reformed in the right renal pelvis. Previously noted right hydroureteronephrosis has resolved.  There continues to be a large collection of high attenuation material in the subperitoneal space along the right pelvic side  wall, similar in size to the prior study measuring approximately 16.2 x 9.6 cm on today's examination. This continues to exert significant mass effect on adjacent structures, including displacement of the urinary bladder into the left side of the pelvis. The urinary bladder appears very distorted, particularly in the region of the right ureterovesicular junction. The distal right ureter is displaced significantly posteriorly around this large right pelvic sidewall hematoma. The intramuscular hemorrhage in the lower right rectus abdominis musculature appears slightly decreased compared to the prior study, currently measuring up to 6.3 x 3.1 cm (image 65 of series 2).  No significant volume of ascites. No pneumoperitoneum. No pathologic distention of small bowel. There are several colonic diverticulae are noted, without surrounding inflammatory changes to suggest  an acute diverticulitis at this time. No definite lymphadenopathy identified within the abdomen or pelvis on today's non contrast CT examination. 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  Musculoskeletal: Again noted are numerous intermediate to high attenuation collections within the subcutaneous fat of the lower anterior abdominal wall bilaterally, likely to represent resolving soft tissue hematomas.  IMPRESSION: 1. Interval placement of right-sided nephrostomy tube with resolution of previously noted right-sided hydroureteronephrosis. 2. Decreased size of intramuscular hemorrhage within the lower right rectus abdominis musculature. However, the extension of hemorrhage into the subperitoneal space along the right pelvic side wall appears very similar to the prior study, with a large amount of persistent mass effect upon the urinary bladder and right ureterovesicular junction, suggesting that the nephrostomy tube on the right side remains necessary at this time. 3. Multiple intermediate to high  attenuation collections in the subcutaneous fat of the lower anterior abdominal wall appear similar to the prior study, likely to represent subcutaneous hemorrhages. 4. Additional incidental findings, as above, similar prior studies.   Electronically Signed   By: Vinnie Langton M.D.   On: 09/16/2013 16:29    Scheduled Meds: . amLODipine  10 mg Oral Daily   And  . benazepril  40 mg Oral Daily  . aspirin  81 mg Oral Daily  . budesonide (PULMICORT) nebulizer solution  0.25 mg Nebulization BID  . clonazePAM  0.5 mg Oral QHS  . flecainide  100 mg Oral BID  . furosemide  40 mg Oral BID  . ipratropium-albuterol  3 mL Nebulization Q4H  . levofloxacin  750 mg Oral Daily  . levothyroxine  25 mcg Oral QAC breakfast  . pantoprazole  40 mg Oral Daily  . predniSONE  40 mg Oral BID WC  . simvastatin  10 mg Oral QPM  . sodium chloride  3 mL Intravenous Q12H    Time spent: >30 minutes    Persais Ethridge  [facility] Pager [phone]. If 7PM-7AM, please contact night-coverage at [email or web address], password [facility] 09/17/2013, 11:08 PM  LOS: 3 days

## 2013-09-17 NOTE — Care Management Note (Signed)
    Page 1 of 2   09/17/2013     2:33:20 PM   CARE MANAGEMENT NOTE 09/17/2013  Patient:  Mckenzie Frank, Mckenzie Frank   Account Number:  [id number]  Date Initiated:  09/17/2013  Documentation initiated by:  HUTCHINSON,CRYSTAL  Subjective/Objective Assessment:   Admitted with shortness of breath -  acute resp failure with hypoxia due to RSV B and bronchitis/early PNA:     Action/Plan:   CM will follow for disposition needs.   Anticipated DC Date:  09/17/2013   Anticipated DC Plan:  [place]  CM consult      [facility] Choice  HOME HEALTH   Choice offered to / List presented to:  NA        HH arranged  HH-1 RN  [place].   Status of service:  Completed, signed off Medicare Important Message given?   (If response is "NO", the following Medicare IM given date fields will be blank) Date Medicare IM given:   Date Additional Medicare IM given:    Discharge Disposition:  [place]  Per UR Regulation:  Reviewed for med. necessity/level of care/duration of stay  If discussed at [employer] of Stay Meetings, dates discussed:    Comments:  09/17/2013 Last Review Date 09/15/2013 Social:  From home HHS:  Active with [facility] for RN, PT services [facility]: Services have been accepted 09/15/2012 ADD: 1+ CM has contacted MD re:  if patient is d/c home on O2 and nebs to prepare for possible weekend d/c. ___________ Mariann Laster RN, BSN, [place], CCM 09/17/2013

## 2013-09-18 LAB — CBC
HCT: 35.4 % — ABNORMAL LOW (ref 36.0–46.0)
Hemoglobin: 10.9 g/dL — ABNORMAL LOW (ref 12.0–15.0)
MCH: 28.1 pg (ref 26.0–34.0)
MCHC: 30.8 g/dL (ref 30.0–36.0)
MCV: 91.2 fL (ref 78.0–100.0)
PLATELETS: 348 10*3/uL (ref 150–400)
RBC: 3.88 MIL/uL (ref 3.87–5.11)
RDW: 17.3 % — AB (ref 11.5–15.5)
WBC: 11.7 10*3/uL — AB (ref 4.0–10.5)

## 2013-09-18 LAB — COMPREHENSIVE METABOLIC PANEL
ALK PHOS: 118 U/L — AB (ref 39–117)
ALT: 31 U/L (ref 0–35)
AST: 27 U/L (ref 0–37)
Albumin: 3.3 g/dL — ABNORMAL LOW (ref 3.5–5.2)
BILIRUBIN TOTAL: 0.7 mg/dL (ref 0.3–1.2)
BUN: 25 mg/dL — AB (ref 6–23)
CHLORIDE: 98 meq/L (ref 96–112)
CO2: 28 mEq/L (ref 19–32)
Calcium: 8.6 mg/dL (ref 8.4–10.5)
Creatinine, Ser: 0.84 mg/dL (ref 0.50–1.10)
GFR calc Af Amer: 77 mL/min — ABNORMAL LOW (ref >90)
GFR calc non Af Amer: 67 mL/min — ABNORMAL LOW (ref >90)
Glucose, Bld: 182 mg/dL — ABNORMAL HIGH (ref 70–99)
POTASSIUM: 4.7 meq/L (ref 3.7–5.3)
Sodium: 142 mEq/L (ref 137–147)
TOTAL PROTEIN: 6.5 g/dL (ref 6.0–8.3)

## 2013-09-18 MED ORDER — IPRATROPIUM-ALBUTEROL 0.5-2.5 (3) MG/3ML IN SOLN: 3.0000 mL | RESPIRATORY_TRACT | Status: DC | PRN

## 2013-09-18 MED ORDER — BUDESONIDE-FORMOTEROL FUMARATE 160-4.5 MCG/ACT IN AERO
2.0000 | INHALATION_SPRAY | Freq: Two times a day (BID) | RESPIRATORY_TRACT | Status: DC
  Administered 2013-09-18 – 2013-09-19 (×2): 2 via RESPIRATORY_TRACT
  Filled 2013-09-18: qty 6

## 2013-09-18 NOTE — Progress Notes (Signed)
[facility] PROGRESS NOTE  FAREEHA EVON UXN:[id number] DOB: Nov 25, 1940 DOA: 09/14/2013 PCP: Ria Bush, MD  Assessment/Plan: 1-acute resp failure with hypoxia due to RSV B and bronchitis/early PNA: -patient continue improving. Still wheezing some today -after discussing with patient and family, will switch treatment to inhalers, continue PO steroids tapering and abx's and if stable will d/c home in am -will reevaluate oxygen requirement and if needed will arrange for her -continue PRN xopenex  -started now on symbicort -leukocytosis improving  2-Atrial fibrillation: stable. -HR controlled -CHADS score 3 -continue antiarrhythmic  -when ok per urology will resume coumadin  3-Rectus sheath hematoma: no further episodes of bleeding or dropping on Hgb appreciated. -CT demonstrating improvement, but still significant pressure on her ureter and bladder from hematoma -coumadin still on hold  4-Hydronephrosis: status post nephrostomy tube. -CT w/o contrast reveal resolution of hydronephrosis -due to still presence of significant pressure of her ureter and bladder unable to remove nephrostomy tube -continue dressing changes every other day while tube in place (dry dressing).  5-HLD: continue statins  6-hypothyroidism: continue synthroid  7-physical deconditioning: per PT rec's will arrange [facility] services.  DVT: SCD's  Code Status: Full Family Communication: daughter at bedside Disposition Plan: home when medically stable   Consultants:  Urology (Dr. Jeffie Pollock)  Procedures:  See below for x-ray reports   Antibiotics:  levaquin   HPI/Subjective: Afebrile,still with some wheezing and feeling weak. O2 sat improving and intermittently off oxygen with good O2 sat  Objective: Filed Vitals:   09/18/13 0514  BP: 136/55  Pulse: 92  Temp: 97.5 F (36.4 C)  Resp: 17    Intake/Output Summary (Last 24 hours) at 09/18/13 1125 Last data filed at 09/18/13 0900  Gross  per 24 hour  Intake   1350 ml  Output   3070 ml  Net  -1720 ml   Filed Weights   09/16/13 0523 09/17/13 0356 09/18/13 0514  Weight: 129.4 kg (285 lb 4.4 oz) 130.001 kg (286 lb 9.6 oz) 128.2 kg (282 lb 10.1 oz)    Exam:   General:  NAD, afebrile, no CP. Breathing better  Cardiovascular: s1 and S2, no rubs or gallops  Respiratory: exp wheezing, scattered rhonchi, no crackles. Cont to Improve air movement  Abdomen: soft, NT, ND, positive BS. Nephrostomy tube still in place. No signs of drainage or infection around insertion area.  Musculoskeletal: 1-2+ edema bilaterally. No pain, no joint swelling  Data Reviewed: Basic Metabolic Panel:  Recent Labs Lab 09/14/13 2132 09/15/13 0344 09/16/13 0510 09/17/13 0520 09/18/13 0454  NA 138 137 141 140 142  K 3.6* 3.4* 3.5* 3.7 4.7  CL 99 97 100 99 98  CO2 _0 GLUCOSE 131* 188* 218* 163* 182*  BUN _1 26* 25*  CREATININE 0.88 0.82 0.79 0.81 0.84  CALCIUM 8.4 8.7 8.7 8.6 8.6   Liver Function Tests:  Recent Labs Lab 09/15/13 0344 09/16/13 0510 09/17/13 0520 09/18/13 0454  AST _2 ALT _3 ALKPHOS 135* 117 111 118*  BILITOT 0.7 0.6 0.6 0.7  PROT 6.8 6.3 6.3 6.5  ALBUMIN 3.3* 3.1* 3.2* 3.3*   CBC:  Recent Labs Lab 09/14/13 2132 09/15/13 0344 09/16/13 0510 09/17/13 0520 09/18/13 0454  WBC 5.0 5.5 13.8* 16.1* 11.7*  NEUTROABS 2.9  --   --   --   --   HGB 10.9* 10.5* 10.0* 10.2* 10.9*  HCT 34.5* 32.5* 32.0* 32.0* 35.4*  MCV  88.9 87.4 89.6 89.1 91.2  PLT 283 315 336 337 348   Cardiac Enzymes:  Recent Labs Lab 09/14/13 2132  TROPONINI <0.30   BNP (last 3 results)  Recent Labs  09/14/13 2132  PROBNP 357.3*    Recent Results (from the past 240 hour(s))  RESPIRATORY VIRUS PANEL     Status: Abnormal   Collection Time    09/15/13  3:13 AM      Result Value Range Status   Source - [facility] NASAL SWAB   Corrected   Comment: CORRECTED ON 01/14 AT 2128: PREVIOUSLY REPORTED AS  NASAL SWAB   Respiratory Syncytial Virus A NOT DETECTED   Final   Respiratory Syncytial Virus B DETECTED (*)  Final   Influenza A NOT DETECTED   Final   Influenza B NOT DETECTED   Final   Parainfluenza 1 NOT DETECTED   Final   Parainfluenza 2 NOT DETECTED   Final   Parainfluenza 3 NOT DETECTED   Final   Metapneumovirus NOT DETECTED   Final   Rhinovirus NOT DETECTED   Final   Adenovirus NOT DETECTED   Final   Influenza A H1 NOT DETECTED   Final   Influenza A H3 NOT DETECTED   Final   Comment: (NOTE)           Normal Reference Range for each Analyte: NOT DETECTED     Testing performed using the Lum[employer] xTAG Respiratory Viral Panel test     kit.     This test was developed and its performance characteristics determined     by [facility]. It has not been cleared or approved by the [place]     Food and Drug Administration. This test is used for clinical purposes.     It should not be regarded as investigational or for research. This     laboratory is certified under the [place] (CLIA) as qualified to perform high complexity     clinical laboratory testing.     Performed at [facility]     Studies: Ct Abdomen Pelvis Wo Contrast  09/16/2013   CLINICAL DATA:  Evaluate for possible drain removal.  EXAM: CT ABDOMEN AND PELVIS WITHOUT CONTRAST  TECHNIQUE: Multidetector CT imaging of the abdomen and pelvis was performed following the standard protocol without intravenous contrast.  COMPARISON:  CT of the abdomen and pelvis 08/30/2013.  FINDINGS: Lung Bases: Small to moderate sized hiatal hernia.  Abdomen/Pelvis: Status post cholecystectomy. The unenhanced appearance of the liver, pancreas, spleen and bilateral adrenal glands is unremarkable. In the anterior aspect of the lower pole of the left kidney there is a 7 mm fatty attenuation lesion, suspicious for a small angiomyolipoma. Sub cm high density lesion in the lateral aspect of the  upper pole of the left kidney may represent a proteinaceous or hemorrhagic cyst. On the right, there is a right sided percutaneous nephrostomy tube in position with the tip of the tube reformed in the right renal pelvis. Previously noted right hydroureteronephrosis has resolved.  There continues to be a large collection of high attenuation material in the subperitoneal space along the right pelvic side wall, similar in size to the prior study measuring approximately 16.2 x 9.6 cm on today's examination. This continues to exert significant mass effect on adjacent structures, including displacement of the urinary bladder into the left side of the pelvis. The urinary bladder appears very distorted, particularly in the region of the  right ureterovesicular junction. The distal right ureter is displaced significantly posteriorly around this large right pelvic sidewall hematoma. The intramuscular hemorrhage in the lower right rectus abdominis musculature appears slightly decreased compared to the prior study, currently measuring up to 6.3 x 3.1 cm (image 65 of series 2).  No significant volume of ascites. No pneumoperitoneum. No pathologic distention of small bowel. There are several colonic diverticulae are noted, without surrounding inflammatory changes to suggest an acute diverticulitis at this time. No definite lymphadenopathy identified within the abdomen or pelvis on today's non contrast CT examination. 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  Musculoskeletal: Again noted are numerous intermediate to high attenuation collections within the subcutaneous fat of the lower anterior abdominal wall bilaterally, likely to represent resolving soft tissue hematomas.  IMPRESSION: 1. Interval placement of right-sided nephrostomy tube with resolution of previously noted right-sided hydroureteronephrosis. 2. Decreased size of intramuscular hemorrhage within the  lower right rectus abdominis musculature. However, the extension of hemorrhage into the subperitoneal space along the right pelvic side wall appears very similar to the prior study, with a large amount of persistent mass effect upon the urinary bladder and right ureterovesicular junction, suggesting that the nephrostomy tube on the right side remains necessary at this time. 3. Multiple intermediate to high attenuation collections in the subcutaneous fat of the lower anterior abdominal wall appear similar to the prior study, likely to represent subcutaneous hemorrhages. 4. Additional incidental findings, as above, similar prior studies.   Electronically Signed   By: Vinnie Langton M.D.   On: 09/16/2013 16:29    Scheduled Meds: . amLODipine  10 mg Oral Daily   And  . benazepril  40 mg Oral Daily  . aspirin  81 mg Oral Daily  . budesonide-formoterol  2 puff Inhalation BID  . clonazePAM  0.5 mg Oral QHS  . flecainide  100 mg Oral BID  . furosemide  40 mg Oral BID  . levofloxacin  750 mg Oral Daily  . levothyroxine  25 mcg Oral QAC breakfast  . pantoprazole  40 mg Oral Daily  . predniSONE  40 mg Oral BID WC  . simvastatin  10 mg Oral QPM  . sodium chloride  3 mL Intravenous Q12H    Time spent: >30 minutes   Sydney Azure  [facility] Pager [phone]. If 7PM-7AM, please contact night-coverage at [email or web address], password [facility] 09/18/2013, 11:25 AM  LOS: 4 days

## 2013-09-18 NOTE — Progress Notes (Signed)
SATURATION QUALIFICATIONS: (This note is used to comply with regulatory documentation for home oxygen)  Patient Saturations on Room Air at Rest = 96%  Patient Saturations on Room Air while Ambulating = 89%  Patient Saturations on 2 Liters of oxygen while Ambulating =99%  Please briefly explain why patient needs home oxygen: Requires oxygen at this time when ambulating to maintain sats above 89%.  Room air saturation at rest 99%.

## 2013-09-18 NOTE — Progress Notes (Signed)
Tolerates being up in chair without incident.

## 2013-09-19 ENCOUNTER — Telehealth: Payer: Self-pay | Admitting: Family Medicine

## 2013-09-19 LAB — CBC
HCT: 35.9 % — ABNORMAL LOW (ref 36.0–46.0)
Hemoglobin: 11.5 g/dL — ABNORMAL LOW (ref 12.0–15.0)
MCH: 28.3 pg (ref 26.0–34.0)
MCHC: 32 g/dL (ref 30.0–36.0)
MCV: 88.2 fL (ref 78.0–100.0)
PLATELETS: 323 10*3/uL (ref 150–400)
RBC: 4.07 MIL/uL (ref 3.87–5.11)
RDW: 16.7 % — AB (ref 11.5–15.5)
WBC: 8.5 10*3/uL (ref 4.0–10.5)

## 2013-09-19 LAB — COMPREHENSIVE METABOLIC PANEL
ALBUMIN: 3.3 g/dL — AB (ref 3.5–5.2)
ALK PHOS: 104 U/L (ref 39–117)
ALT: 41 U/L — ABNORMAL HIGH (ref 0–35)
AST: 26 U/L (ref 0–37)
BILIRUBIN TOTAL: 0.8 mg/dL (ref 0.3–1.2)
BUN: 27 mg/dL — ABNORMAL HIGH (ref 6–23)
CHLORIDE: 98 meq/L (ref 96–112)
CO2: 29 mEq/L (ref 19–32)
Calcium: 8.8 mg/dL (ref 8.4–10.5)
Creatinine, Ser: 0.8 mg/dL (ref 0.50–1.10)
GFR calc Af Amer: 82 mL/min — ABNORMAL LOW (ref >90)
GFR calc non Af Amer: 71 mL/min — ABNORMAL LOW (ref >90)
Glucose, Bld: 139 mg/dL — ABNORMAL HIGH (ref 70–99)
POTASSIUM: 4 meq/L (ref 3.7–5.3)
Sodium: 140 mEq/L (ref 137–147)
Total Protein: 6.3 g/dL (ref 6.0–8.3)

## 2013-09-19 MED ORDER — LEVALBUTEROL HCL 0.63 MG/3ML IN NEBU: 0.6300 mg | INHALATION_SOLUTION | Freq: Four times a day (QID) | RESPIRATORY_TRACT | Status: DC | PRN

## 2013-09-19 MED ORDER — BUDESONIDE-FORMOTEROL FUMARATE 160-4.5 MCG/ACT IN AERO: 2.0000 | INHALATION_SPRAY | Freq: Two times a day (BID) | RESPIRATORY_TRACT | Status: DC

## 2013-09-19 MED ORDER — FUROSEMIDE 40 MG PO TABS: 40.0000 mg | ORAL_TABLET | Freq: Two times a day (BID) | ORAL | Status: DC

## 2013-09-19 MED ORDER — LEVOFLOXACIN 750 MG PO TABS: 750.0000 mg | ORAL_TABLET | Freq: Every day | ORAL | Status: DC

## 2013-09-19 MED ORDER — PREDNISONE 20 MG PO TABS: ORAL_TABLET | ORAL | Status: DC

## 2013-09-19 MED ORDER — GUAIFENESIN-DM 100-10 MG/5ML PO SYRP: 5.0000 mL | ORAL_SOLUTION | ORAL | Status: DC | PRN

## 2013-09-19 NOTE — Progress Notes (Signed)
MD in to evaluate and examine client.  MD aware of c/o discomfort left lower abdomen.  "It could be from moving chair lever up and down."  Awaiting discharge instructions.

## 2013-09-19 NOTE — Telephone Encounter (Signed)
Pt hospitalized 1/13-18/2015 with acute resp failure and RSV bronchitis/PNA. plz call to ensure [facility] will be coming out to house for PT and ensure pt has f/u appt with myself and urology.

## 2013-09-19 NOTE — Discharge Summary (Addendum)
Physician Discharge Summary  Mckenzie Frank QPY:[id number] DOB: 1941/04/25 DOA: 09/14/2013  PCP: Ria Bush, MD  Admit date: 09/14/2013 Discharge date: 09/19/2013  Time spent: >30 minutes  Recommendations for Outpatient Follow-up:  CBC to follow WBC's trend and Hgb BMET to follow electrolytes and  Follow up with pulmonologist and urologist   Discharge Diagnoses:  Acute resp failure due to RSV B bronchitis/bronchiolitis and early PNA Atrial fibrillation Hydronephrosis Rectal hematoma sheath HLD Hypothyroidism Physical deconditioning   Discharge Condition: stable and improved. Will discharge home with home health services  Diet recommendation: heart healthy diet  Filed Weights   09/17/13 0356 09/18/13 0514 09/19/13 0634  Weight: 130.001 kg (286 lb 9.6 oz) 128.2 kg (282 lb 10.1 oz) 127.4 kg (280 lb 13.9 oz)    History of present illness:  73 y.o. year old female with significant past medical history of HTN, afib, diverticulosis with recent diverticulitis, as recent admission for urinary obstruction in setting of abdominal wall hematoma s/p nephrectomy tube placement here with resp distress and wheezing. Pt is noted to have had a fairly complicated medical course including admission for diverticulitis w/ resolution and development of abdominal wall hematoma in setting on anticoagulation bridiging w/ secondary urinary obstruction s/p perc nephrectomy tube placement. Pt was seen at her PCPs office 09/13/13 when pt reported having some URI sxs with cough and wheezing. Pt was given depomedrol $RemoveBeforeDE'80mg'[id number]$  IM x1 as well as course of augmentin. No prior hx/o asthma, COPD or smoking in the past. + flu shot this year. No fevers or chills. Minimal myalgias. Mild rhinorrhea and nasal congestion. Pt states that she had persistent wheezing, cough and resp distress despite treatment. Pt then reported to ER because of worsening sxs.  In the ER, CXR was obtained that showed no focal infiltrate. Pt was given  multiple CAT treatments as well as IV solumedrol with mild improvement in sxs, though with still persistent increased WOB.    Hospital Course:  1-Acute resp failure with hypoxia due to RSV B and bronchitis/early PNA:  -patient continue improving.  -minimally wheezing at discharge  -will discharge on tapering prednisone, levaquin treatment and will start symbicort and PRN xopenex -patient with some findings on CXR that suggest asthma/COPD -will benefit of PFT's and pulmonology follow up in outpatient setting -will discharge for oxygen use at home especially on exertion.  2-Atrial fibrillation: stable.  -HR controlled  -CHADS score 3 -4 -continue antiarrhythmic  -when ok per urology plan is to resume coumadin   3-Rectus sheath hematoma: no further episodes of bleeding or dropping on Hgb appreciated.  -CT demonstrating improvement, but still significant pressure on her ureter and bladder from hematoma  -coumadin still on hold   4-Hydronephrosis: status post nephrostomy tube.  -CT w/o contrast reveal resolution of hydronephrosis  -due to still presence of significant pressure of her ureter and bladder unable to remove nephrostomy tube at this time -continue dressing changes and follow up with Dr. Jeffie Pollock for further rec's  5-HLD: continue statins   6-hypothyroidism: continue synthroid   7-physical deconditioning: per PT rec's will arrange [facility] services.  8-leukocytosis: due to bronchitis/early PNA and/or steroids use.  Procedures:  See below for x-ray reports   Consultations:  None   Discharge Exam: Filed Vitals:   09/19/13 0634  BP: 146/74  Pulse: 73  Temp: 97.7 F (36.5 C)  Resp: 18   General: NAD, afebrile, no CP. Breathing a lot better  Cardiovascular: s1 and S2, no rubs or gallops  Respiratory: really mild  exp wheezing, scattered rhonchi, no crackles. Improved air movement  Abdomen: soft, NT, ND, positive BS. Nephrostomy tube still in place. No signs of drainage or  infection around insertion area.  Musculoskeletal: 1-2+ edema bilaterally. No pain, no joint swelling  Discharge Instructions  Discharge Orders   Future Appointments Provider Department Dept Phone   10/01/2013 9:00 AM Ria Bush, MD [place] at [facility] [phone]   Future Orders Complete By Expires   Discharge instructions  As directed    Comments:     Take medications as prescribed Follow a heart healthy diet Arrange follow up with PCP in 1 week Please set up appointment with pulmonology service as instructed   For home use only DME oxygen  As directed    Comments:     Especially on exertion   Questions:     Mode or (Route):  Nasal cannula   Liters per Minute:  2   Frequency:     Oxygen conserving device:         Medication List    STOP taking these medications       amoxicillin-clavulanate 875-125 MG per tablet  Commonly known as:  AUGMENTIN      TAKE these medications       acetaminophen 500 MG tablet  Commonly known as:  TYLENOL  Take 1,000 mg by mouth every 6 (six) hours as needed for moderate pain.     amLODipine-benazepril 10-40 MG per capsule  Commonly known as:  LOTREL  Take 1 capsule by mouth daily.     aspirin 81 MG chewable tablet  Chew 1 tablet (81 mg total) by mouth daily.     budesonide-formoterol 160-4.5 MCG/ACT inhaler  Commonly known as:  SYMBICORT  Inhale 2 puffs into the lungs 2 (two) times daily.     clonazePAM 0.5 MG tablet  Commonly known as:  KLONOPIN  Take 0.5 mg by mouth at bedtime.     flecainide 100 MG tablet  Commonly known as:  TAMBOCOR  Take 1 tablet (100 mg total) by mouth 2 (two) times daily.     furosemide 40 MG tablet  Commonly known as:  LASIX  Take 1 tablet (40 mg total) by mouth 2 (two) times daily.     guaiFENesin-dextromethorphan 100-10 MG/5ML syrup  Commonly known as:  ROBITUSSIN DM  Take 5 mLs by mouth every 4 (four) hours as needed for cough.     lansoprazole 15 MG capsule  Commonly  known as:  PREVACID  Take 1 capsule (15 mg total) by mouth daily.     levalbuterol 45 MCG/ACT inhaler  Commonly known as:  XOPENEX HFA  Inhale 1-2 puffs into the lungs every 4 (four) hours as needed for wheezing or shortness of breath.     levofloxacin 750 MG tablet  Commonly known as:  LEVAQUIN  Take 1 tablet (750 mg total) by mouth daily.     levothyroxine 25 MCG tablet  Commonly known as:  SYNTHROID, LEVOTHROID  Take 1 tablet (25 mcg total) by mouth daily.     metoprolol 50 MG tablet  Commonly known as:  LOPRESSOR  Take 1 tablet (50 mg total) by mouth 2 (two) times daily.     predniSONE 20 MG tablet  Commonly known as:  DELTASONE  Take 3 tabs by mouth daily X 1 day; then 2 tabs by mouth daily X 2 days; then 1 tab by mouth daily X 2 days; then 1/2 tab by mouth daily X 2 days and  stop prednisone     simvastatin 20 MG tablet  Commonly known as:  ZOCOR  Take 0.5 tablets (10 mg total) by mouth every evening.       Allergies  Allergen Reactions  . Paxil [Paroxetine Hcl] Other (See Comments)    Shaking   . Clindamycin/Lincomycin Other (See Comments)    unknown  . Codeine Other (See Comments)    unknown  . Doxycycline Other (See Comments)    unknown  . Promethazine Other (See Comments)    unknown  . Sulfa Antibiotics Hives  . Valium Other (See Comments)    unknown       Follow-up Information   Follow up with Ria Bush, MD. Schedule an appointment as soon as possible for a visit in 1 week.   Specialty:  Family Medicine   Contact information:   [place] [address] [phone]       Follow up with [facility]. Schedule an appointment as soon as possible for a visit in 2 weeks. (to set up appointment for PFT's and evaluation in 2 weeks)    Specialty:  Pulmonology   Contact information:   [place] [address] [phone]      The results of significant diagnostics from this hospitalization (including  imaging, microbiology, ancillary and laboratory) are listed below for reference.    Significant Diagnostic Studies: Ct Abdomen Pelvis Wo Contrast  09/16/2013   CLINICAL DATA:  Evaluate for possible drain removal.  EXAM: CT ABDOMEN AND PELVIS WITHOUT CONTRAST  TECHNIQUE: Multidetector CT imaging of the abdomen and pelvis was performed following the standard protocol without intravenous contrast.  COMPARISON:  CT of the abdomen and pelvis 08/30/2013.  FINDINGS: Lung Bases: Small to moderate sized hiatal hernia.  Abdomen/Pelvis: Status post cholecystectomy. The unenhanced appearance of the liver, pancreas, spleen and bilateral adrenal glands is unremarkable. In the anterior aspect of the lower pole of the left kidney there is a 7 mm fatty attenuation lesion, suspicious for a small angiomyolipoma. Sub cm high density lesion in the lateral aspect of the upper pole of the left kidney may represent a proteinaceous or hemorrhagic cyst. On the right, there is a right sided percutaneous nephrostomy tube in position with the tip of the tube reformed in the right renal pelvis. Previously noted right hydroureteronephrosis has resolved.  There continues to be a large collection of high attenuation material in the subperitoneal space along the right pelvic side wall, similar in size to the prior study measuring approximately 16.2 x 9.6 cm on today's examination. This continues to exert significant mass effect on adjacent structures, including displacement of the urinary bladder into the left side of the pelvis. The urinary bladder appears very distorted, particularly in the region of the right ureterovesicular junction. The distal right ureter is displaced significantly posteriorly around this large right pelvic sidewall hematoma. The intramuscular hemorrhage in the lower right rectus abdominis musculature appears slightly decreased compared to the prior study, currently measuring up to 6.3 x 3.1 cm (image 65 of series 2).  No  significant volume of ascites. No pneumoperitoneum. No pathologic distention of small bowel. There are several colonic diverticulae are noted, without surrounding inflammatory changes to suggest an acute diverticulitis at this time. No definite lymphadenopathy identified within the abdomen or pelvis on today's non contrast CT examination. 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  Musculoskeletal: Again noted are numerous intermediate to high attenuation collections within  the subcutaneous fat of the lower anterior abdominal wall bilaterally, likely to represent resolving soft tissue hematomas.  IMPRESSION: 1. Interval placement of right-sided nephrostomy tube with resolution of previously noted right-sided hydroureteronephrosis. 2. Decreased size of intramuscular hemorrhage within the lower right rectus abdominis musculature. However, the extension of hemorrhage into the subperitoneal space along the right pelvic side wall appears very similar to the prior study, with a large amount of persistent mass effect upon the urinary bladder and right ureterovesicular junction, suggesting that the nephrostomy tube on the right side remains necessary at this time. 3. Multiple intermediate to high attenuation collections in the subcutaneous fat of the lower anterior abdominal wall appear similar to the prior study, likely to represent subcutaneous hemorrhages. 4. Additional incidental findings, as above, similar prior studies.   Electronically Signed   By: Vinnie Langton M.D.   On: 09/16/2013 16:29   Ct Abdomen Pelvis Wo Contrast  08/30/2013   CLINICAL DATA:  Followup evaluation for rectus sheath hematoma. Increasing abdominal pain.  EXAM: CT ABDOMEN AND PELVIS WITHOUT CONTRAST  TECHNIQUE: Multidetector CT imaging of the abdomen and pelvis was performed following the standard protocol without intravenous contrast.  COMPARISON:  CT of the abdomen and pelvis  08/27/2013.  FINDINGS: Lung Bases: Moderate hiatal hernia. Atherosclerosis, including calcified atherosclerotic plaque in the left anterior descending and right coronary arteries.  Abdomen/Pelvis: Diffusely decreased attenuation throughout the hepatic parenchyma, compatible with hepatic steatosis. Status post cholecystectomy. The unenhanced appearance of the pancreas, spleen and bilateral adrenal glands is unremarkable. 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 New mild to moderate right hydroureteronephrosis which appears to be related to extensive mass effect upon the distal 3rd of the right ureter (discussed below).  When compared to the prior examination, the amount of high attenuation material within the right side of the rectus abdominis musculature has significantly increased in volume, currently spanning 16.7 cm in length, measuring up to 8.1 x 4.5 cm transaxially. However, this hemorrhage has also dissected into the subperitoneal spaces of the right side of the anatomic pelvis, where there is a very large extraperitoneal high attenuation fluid collection which measures up to 15.6 x 9.9 cm trans axially, extending over a length of approximately 12.3 cm craniocaudally. Notably, it is this extraperitoneal pelvic hemorrhage which exerts mass effect upon the urinary bladder displacing it into the far left side of the pelvis, and also exerting mass effect upon the distal 3rd of the right ureter near the right ureterovesicular junction, likely accounting for the right-sided hydroureteronephrosis. No definite intraperitoneal extension of this hemorrhage is noted at this time. Some of this hemorrhage is dissecting up the right side of the retroperitoneum.  Trace volume of ascites. No pneumoperitoneum. No pathologic distention of small bowel. Atherosclerosis throughout the abdominal and pelvic vasculature. Numerous colonic diverticulae noted,  without surrounding inflammatory changes to suggest an acute diverticulitis at this time. Status post hysterectomy. Ovaries are not confidently identified may be surgically absent or atrophic.  Musculoskeletal: Multifocal high attenuation foci within the subcutaneous fat of the anterior abdominal wall bilaterally, similar to the prior study, with compatible with multiple additional foci of hemorrhage. There is obscuration of the normal intervening fat planes in the right flank musculature, with surrounding soft tissue stranding in the overlying subcutaneous fat, likely to reflect edema/inflammation related to resolving hematoma. There are no aggressive appearing lytic or blastic lesions noted in the visualized portions of the skeleton.  IMPRESSION: 1. Significant interval enlargement of the previously noted hematoma in the right rectus abdominis musculature, now with extension of hemorrhage into the subperitoneal spaces in the right hemipelvis where there is a large extraperitoneal hematoma exerting significant mass effect upon the urinary bladder and distal right ureter resulting in mild to moderate right hydroureteronephrosis. 2. Multiple small hemorrhages in the subcutaneous fat of the anterior abdominal wall redemonstrated. 3. Extensive atherosclerosis, including at least 2 vessel coronary artery disease, as above. 4. Colonic diverticulosis without definite findings to suggest acute diverticulitis at this time. 5. Hepatic steatosis. 6. Postoperative changes, as above.   Electronically Signed   By: Vinnie Langton M.D.   On: 08/30/2013 08:13   Ct Abdomen Pelvis Wo Contrast  08/27/2013   CLINICAL DATA:  Mid abdominal right lower quadrant pain, bruising, sigmoid diverticulitis, on anticoagulant therapy  EXAM: CT ABDOMEN AND PELVIS WITHOUT CONTRAST  TECHNIQUE: Multidetector CT imaging of the abdomen and pelvis was performed following the standard protocol without intravenous contrast. Sagittal and coronal MPR  images reconstructed from axial data set. Dilute oral contrast was administered for this exam.  COMPARISON:  08/12/2013  FINDINGS: Lung bases clear.  Gallbladder surgically absent.  Moderate-sized hiatal hernia.  Within limits of a nonenhanced exam no focal abnormalities of the liver, spleen, pancreas, kidneys, or adrenal glands.  Scattered atherosclerotic calcifications.  New right rectus sheath hematoma, 8.6 x 4.4 cm image 59 at least 12.4 cm length.  Multiple subcutaneous nodular opacities are identified likely small subcutaneous hematomas in the anterior abdominal wall bilaterally greater on right, also new.  Stranding of subcutaneous fat at the lateral aspect of the mid to inferior right abdominal wall likely related to above hemorrhages.  Small amount of high attenuation blood seen within the prevesical space likely from leak of the adjacent right rectus sheath hematoma.  No additional intra-abdominal, intra pelvic, or retroperitoneal hemorrhage.  Sigmoid diverticulosis without evidence of diverticulitis.  Bladder contains minimal urine.  Stomach and bowel loops otherwise normal appearance.  Appendix not visualized.  No mass, adenopathy, or free fluid.  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  IMPRESSION: Right rectus sheath hematoma with extension of a small amount of hemorrhage into the prevesical space.  Multiple subcutaneous hematomas.  Moderate-sized hiatal hernia.  Sigmoid diverticulosis with resolution of the sigmoid diverticulitis changes seen on the previous exam.   Electronically Signed   By: Lavonia Dana M.D.   On: 08/27/2013 14:29   US Renal  08/30/2013   CLINICAL DATA:  Acute renal failure.  Hematuria.  Hypertension.  EXAM: RENAL/URINARY TRACT ULTRASOUND COMPLETE  COMPARISON:  08/27/2013 CT.  FINDINGS: Right Kidney:  Length: 11.2 cm.  Moderate hydronephrosis.  Etiology indeterminate.  Left Kidney:  Length: 10.6 cm. No left-sided hydronephrosis. 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  Bladder:  Patient voided prior to present exam and evaluation of the bladder is limited.  IMPRESSION: Moderate right-sided hydronephrosis.  Etiology indeterminate.  No evidence of left-sided hydronephrosis.  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   Electronically Signed   By: Chauncey Cruel M.D.   On: 08/30/2013 07:44  Ir Perc Nephrostomy Right  08/31/2013   CLINICAL DATA:  73 year old with right hydronephrosis due to a large retroperitoneal hematoma.  EXAM: PERCUTANEOUS RIGHT NEPHROSTOMY TUBE PLACEMENT WITH ULTRASOUND AND FLUOROSCOPIC GUIDANCE  Physician: Stephan Minister. Henn, MD  MEDICATIONS: Versed 1.5 mg, fentanyl 100 mcg. Ancef. A radiology nurse monitored the patient for moderate sedation.  ANESTHESIA/SEDATION: Moderate sedation time: 30 min  FLUOROSCOPY TIME:  5 min and 36 seconds  PROCEDURE: The procedure was explained to the patient. The risks and benefits of the procedure were discussed and the patient's questions were addressed. Informed consent was obtained from the patient. The patient was placed prone on the interventional table. Ultrasound demonstrated right hydronephrosis. The right flank was prepped and draped in a sterile fashion. Maximal barrier sterile technique was utilized including caps, mask, sterile gowns, sterile gloves, sterile drape, hand hygiene and skin antiseptic. The skin and soft tissues were anesthetized with 1% lidocaine. A 22 gauge Chiba needle was directed into the right lower pole collecting system with ultrasound guidance. Urine was identified at the catheter hub.  Small amount of contrast was injected to confirm placement in the collecting system. A 0.018 wire was advanced into the renal pelvis and proximal ureter. The tract was dilated with an Accustick dilator set. A 10 [place] multipurpose drain was reconstituted in the renal pelvis. Catheter was sutured to the skin and attached to gravity bag. Fluoroscopic and ultrasound images were taken and saved for documentation.  COMPLICATIONS: None  FINDINGS: Moderate right hydronephrosis. Catheter placement in the renal pelvis.  IMPRESSION: Successful placement of right percutaneous nephrostomy tube with ultrasound and fluoroscopic guidance.   Electronically Signed   By: Markus Daft M.D.   On: 08/31/2013 16:02   Ir US Guide Bx Asp/drain  08/31/2013   CLINICAL DATA:  73 year old with right hydronephrosis due to a large retroperitoneal hematoma.  EXAM: PERCUTANEOUS RIGHT NEPHROSTOMY TUBE PLACEMENT WITH ULTRASOUND AND FLUOROSCOPIC GUIDANCE  Physician: Stephan Minister. Henn, MD  MEDICATIONS: Versed 1.5 mg, fentanyl 100 mcg. Ancef. A radiology nurse monitored the patient for moderate sedation.  ANESTHESIA/SEDATION: Moderate sedation time: 30 min  FLUOROSCOPY TIME:  5 min and 36 seconds  PROCEDURE: The procedure was explained to the patient. The risks and benefits of the procedure were discussed and the patient's questions were addressed. Informed consent was obtained from the patient. The patient was placed prone on the interventional table. Ultrasound demonstrated right hydronephrosis. The right flank was prepped and draped in a sterile fashion. Maximal barrier sterile technique was utilized including caps, mask, sterile gowns, sterile gloves, sterile drape, hand hygiene and skin antiseptic. The skin and soft tissues were anesthetized with 1% lidocaine. A 22 gauge Chiba needle was directed into the right lower pole collecting system with ultrasound guidance. Urine was identified at the catheter hub. Small amount of contrast was injected to  confirm placement in the collecting system. A 0.018 wire was advanced into the renal pelvis and proximal ureter. The tract was dilated with an Accustick dilator set. A 10 [place] multipurpose drain was reconstituted in the renal pelvis. Catheter was sutured to the skin and attached to gravity bag. Fluoroscopic and ultrasound images were taken and saved for documentation.  COMPLICATIONS: None  FINDINGS: Moderate right hydronephrosis. Catheter placement in the renal pelvis.  IMPRESSION: Successful placement of right percutaneous nephrostomy tube with ultrasound and fluoroscopic guidance.   Electronically Signed   By: Markus Daft M.D.   On: 08/31/2013 16:02   Dg Chest Port 1 View  09/14/2013  CLINICAL DATA:  Shortness of breath and cough  EXAM: PORTABLE CHEST - 1 VIEW  COMPARISON:  None.  FINDINGS: The heart size and mediastinal contours are within normal limits. Both lungs are clear. The visualized skeletal structures are unremarkable.  IMPRESSION: No active disease.   Electronically Signed   By: Inez Catalina M.D.   On: 09/14/2013 21:18    Microbiology: Recent Results (from the past 240 hour(s))  RESPIRATORY VIRUS PANEL     Status: Abnormal   Collection Time    09/15/13  3:13 AM      Result Value Range Status   Source - [facility] NASAL SWAB   Corrected   Comment: CORRECTED ON 01/14 AT 2128: PREVIOUSLY REPORTED AS NASAL SWAB   Respiratory Syncytial Virus A NOT DETECTED   Final   Respiratory Syncytial Virus B DETECTED (*)  Final   Influenza A NOT DETECTED   Final   Influenza B NOT DETECTED   Final   Parainfluenza 1 NOT DETECTED   Final   Parainfluenza 2 NOT DETECTED   Final   Parainfluenza 3 NOT DETECTED   Final   Metapneumovirus NOT DETECTED   Final   Rhinovirus NOT DETECTED   Final   Adenovirus NOT DETECTED   Final   Influenza A H1 NOT DETECTED   Final   Influenza A H3 NOT DETECTED   Final   Comment: (NOTE)           Normal Reference Range for each Analyte: NOT DETECTED     Testing performed  using the Lum[employer] xTAG Respiratory Viral Panel test     kit.     This test was developed and its performance characteristics determined     by [employer]. It has not been cleared or approved by the [place]     Food and Drug Administration. This test is used for clinical purposes.     It should not be regarded as investigational or for research. This     laboratory is certified under the [place] (CLIA) as qualified to perform high complexity     clinical laboratory testing.     Performed at [facility]: Basic Metabolic Panel:  Recent Labs Lab 09/15/13 0344 09/16/13 0510 09/17/13 0520 09/18/13 0454 09/19/13 0608  NA 137 141 140 142 140  K 3.4* 3.5* 3.7 4.7 4.0  CL 97 100 99 98 98  CO2 $Re'20 22 25 28 29  'ivv$ GLUCOSE 188* 218* 163* 182* 139*  BUN 16 21 26* 25* 27*  CREATININE 0.82 0.79 0.81 0.84 0.80  CALCIUM 8.7 8.7 8.6 8.6 8.8   Liver Function Tests:  Recent Labs Lab 09/15/13 0344 09/16/13 0510 09/17/13 0520 09/18/13 0454 09/19/13 0608  AST $Re'19 15 15 27 26  'exz$ ALT $R'20 17 19 31 'QP$ 41*  ALKPHOS 135* 117 111 118* 104  BILITOT 0.7 0.6 0.6 0.7 0.8  PROT 6.8 6.3 6.3 6.5 6.3  ALBUMIN 3.3* 3.1* 3.2* 3.3* 3.3*   CBC:  Recent Labs Lab 09/14/13 2132 09/15/13 0344 09/16/13 0510 09/17/13 0520 09/18/13 0454 09/19/13 0608  WBC 5.0 5.5 13.8* 16.1* 11.7* 8.5  NEUTROABS 2.9  --   --   --   --   --   HGB 10.9* 10.5* 10.0* 10.2* 10.9* 11.5*  HCT 34.5* 32.5* 32.0* 32.0* 35.4* 35.9*  MCV 88.9 87.4 89.6 89.1 91.2 88.2  PLT 283 315 336 337 348 323   Cardiac  Enzymes:  Recent Labs Lab 09/14/13 2132  TROPONINI <0.30   BNP: BNP (last 3 results)  Recent Labs  09/14/13 2132  PROBNP 357.3*    Signed:  Jaleesa Cervi  [facility] 09/19/2013, 11:47 AM

## 2013-09-19 NOTE — Progress Notes (Signed)
Patient having c/o dry hacking cough. PRN Robitussin administered as ordered. Patient currently resting comfortably, will continue to monitor. Mckenzie Frank

## 2013-09-19 NOTE — Progress Notes (Signed)
   CARE MANAGEMENT NOTE 09/19/2013  Patient:  Mckenzie Frank, Mckenzie Frank   Account Number:  [id number]  Date Initiated:  09/17/2013  Documentation initiated by:  HUTCHINSON,CRYSTAL  Subjective/Objective Assessment:   Admitted with shortness of breath -  acute resp failure with hypoxia due to RSV B and bronchitis/early PNA:     Action/Plan:   CM will follow for disposition needs.   Anticipated DC Date:  09/17/2013   Anticipated DC Plan:  [place]  CM consult      [facility] Choice  HOME HEALTH   Choice offered to / List presented to:  NA   DME arranged  [facility]      DME agency  [place]     [place] arranged  HH-1 RN  [place] [facility]      [place] agency  [place].   Status of service:  Completed, signed off Medicare Important Message given?   (If response is "NO", the following Medicare IM given date fields will be blank) Date Medicare IM given:   Date Additional Medicare IM given:    Discharge Disposition:  [place]  Per UR Regulation:  Reviewed for med. necessity/level of care/duration of stay  If discussed at [place] of Stay Meetings, dates discussed:    Comments:  09/19/13 11:00 CM received call notifying of pt discharge. Pt's insurance is [employer] and [facility] does not accept [employer]. Referral faxed to [facility] with request oxygen be delivered to pt's room prior to discharge.  Call placed again at 14:00 to check on 02 delivery and [employer] stated they are processing request.  Call made again at 15:49 and [employer] states 02 is on its way to being delivered.  Pt kept abreast of each call.  HHRN/PT will be rendered by [facility]. Notification of discharge made to [facility].  No other CM needs were communicated.  Mariane Masters, BSN, [place] [phone].  09/17/2013 Last Review Date 09/15/2013 Social:  From home HHS:  Active with [facility] for RN, PT services [facility]: Services have been accepted 09/15/2012 ADD: 1+ CM has contacted MD re:  if patient is d/c  home on O2 and nebs to prepare for possible weekend d/c. ___________ Mariann Laster RN, BSN, [place], CCM 09/17/2013

## 2013-09-20 MED ORDER — LEVALBUTEROL TARTRATE 45 MCG/ACT IN AERO: 1.0000 | INHALATION_SPRAY | RESPIRATORY_TRACT | Status: DC | PRN

## 2013-09-21 ENCOUNTER — Telehealth: Payer: Self-pay | Admitting: Family Medicine

## 2013-09-21 NOTE — Telephone Encounter (Signed)
Noted. Thanks.

## 2013-09-21 NOTE — Telephone Encounter (Signed)
See TCC note.

## 2013-09-21 NOTE — Telephone Encounter (Signed)
Transitional Care Call.  D/C'd from hospital on 09/19/13.  D/C dx: Acute resp failure due to RSV B bronchitis/bronchiolitis and early PNA, Atrial fibrillation, Hydronephrosis, Rectal hematoma sheath, HLD, Hypothyroidism, and Physical deconditioning.  Spoke with pt.  She says that she is "doing great" besides having the O2 equipment that was brought to her one bedroom apartment that she doesn't need.  She has been trying to contact the company to come and pick it back up.  She said that the MD at the hospital checked her O2 sats and told her that she no longer required O2.  States that her breathing is "good" but that she sometimes wheezes at night, which subsides when she takes Robitussin.  She is ambulating independently, relies on her daughter to drive her to her doctor's appts.  Home Health is expected to come today to set up nurse visits and PT.  Her daughter is supposed to call to schedule urology appt next week.  Denies questions r/t d/c instructions or medication list.  Denies questions for Dr. Danise Mina.  Follow up appt scheduled for 10/01/13.

## 2013-09-24 ENCOUNTER — Ambulatory Visit (INDEPENDENT_AMBULATORY_CARE_PROVIDER_SITE_OTHER): Payer: Medicare HMO | Admitting: Family Medicine

## 2013-09-24 ENCOUNTER — Encounter: Payer: Self-pay | Admitting: Family Medicine

## 2013-09-24 VITALS — BP 130/78 | HR 72 | Temp 98.5°F | Wt 270.0 lb

## 2013-09-24 DIAGNOSIS — J209 Acute bronchitis, unspecified: Secondary | ICD-10-CM

## 2013-09-24 DIAGNOSIS — I4891 Unspecified atrial fibrillation: Secondary | ICD-10-CM

## 2013-09-24 DIAGNOSIS — J205 Acute bronchitis due to respiratory syncytial virus: Secondary | ICD-10-CM

## 2013-09-24 DIAGNOSIS — B338 Other specified viral diseases: Secondary | ICD-10-CM

## 2013-09-24 DIAGNOSIS — S301XXA Contusion of abdominal wall, initial encounter: Secondary | ICD-10-CM

## 2013-09-24 DIAGNOSIS — B974 Respiratory syncytial virus as the cause of diseases classified elsewhere: Secondary | ICD-10-CM

## 2013-09-24 DIAGNOSIS — J45909 Unspecified asthma, uncomplicated: Secondary | ICD-10-CM

## 2013-09-24 NOTE — Assessment & Plan Note (Addendum)
Recent hospitalization for this - treated with prednisone course and levaquin course as well to cover possible early PNA. On xopenex as well as symbicort started. No h/o RAD/asthma/COPD.  Will appreciate pulm eval. Will refer in 6wks once acute illness has resolved. I have also asked Rosaria Ferries to call [facility] today to f/u on PT - pt needs therapy given deconditioning in hospital and current weakness.  I also want their opinion prior to prescribing lift chair as these can increase fall risk.  Discussed with patient.

## 2013-09-24 NOTE — Progress Notes (Signed)
Pre-visit discussion using our clinic review tool. No additional management support is needed unless otherwise documented below in the visit note.  

## 2013-09-24 NOTE — Progress Notes (Signed)
Subjective:    Patient ID: Linna Hoff, female    DOB: 1941-04-07, 73 y.o.   MRN: [id number]  HPI CC: hosp f/u  Recent hospitalization for RSV bronchiolitis/early PNA with possible asthma/COPD.  Treated with levaquin course, prednisone taper, and symbicort was initiated along with xopenex.  Has not needed home oxygen - this has never been picked up.  Plan is to establish with pulm down the road for further evaluation of reactive airway disease in this never smoker.  Did have passive smoke exposure (husband smoked).  Minimal cough.  No dyspnea.  Mild wheezing.  No fevers/chills.  Appetite ok.  Taking symbicort 2 puffs bid, and xopenex nebulizer as needed.  Will set up with pulmonologist in 1-2 months for PFTs.  Wt Readings from Last 3 Encounters:  09/24/13 270 lb (122.471 kg)  09/19/13 280 lb 13.9 oz (127.4 kg)  09/13/13 291 lb 4 oz (132.11 kg)    Home health has not been coming out to the house (only RN once but no physical therapist).  [facility].  Prior rectus sheath hematoma - f/u CT in hospital with persistent hematoma and mild compression of bladder/kidney, so pt will f/u with urology prior to restarting coumadin.  To schedule appt with Dr. Jeffie Pollock next week.  Admit date: 09/14/2013  Discharge date: 09/19/2013  F/u phone call: 09/21/2013 Recommendations for Outpatient Follow-up:  CBC to follow WBC's trend and Hgb  BMET to follow electrolytes and  Follow up with pulmonologist and urologist  Discharge Diagnoses:  Acute resp failure due to RSV B bronchitis/bronchiolitis and early PNA  Atrial fibrillation  Hydronephrosis  Rectal hematoma sheath  HLD  Hypothyroidism  Physical deconditioning   History of present illness:  73 y.o. year old female with significant past medical history of HTN, afib, diverticulosis with recent diverticulitis, as recent admission for urinary obstruction in setting of abdominal wall hematoma s/p nephrectomy tube placement here with  resp distress and wheezing. Pt is noted to have had a fairly complicated medical course including admission for diverticulitis w/ resolution and development of abdominal wall hematoma in setting on anticoagulation bridiging w/ secondary urinary obstruction s/p perc nephrectomy tube placement. Pt was seen at her PCPs office 09/13/13 when pt reported having some URI sxs with cough and wheezing. Pt was given depomedrol 80mg  IM x1 as well as course of augmentin. No prior hx/o asthma, COPD or smoking in the past. + flu shot this year. No fevers or chills. Minimal myalgias. Mild rhinorrhea and nasal congestion. Pt states that she had persistent wheezing, cough and resp distress despite treatment. Pt then reported to ER because of worsening sxs.  In the ER, CXR was obtained that showed no focal infiltrate. Pt was given multiple CAT treatments as well as IV solumedrol with mild improvement in sxs, though with still persistent increased WOB.  Hospital Course:  1-Acute resp failure with hypoxia due to RSV B and bronchitis/early PNA:  -patient continue improving.  -minimally wheezing at discharge  -will discharge on tapering prednisone, levaquin treatment and will start symbicort and PRN xopenex  -patient with some findings on CXR that suggest asthma/COPD  -will benefit of PFT's and pulmonology follow up in outpatient setting  -will discharge for oxygen use at home especially on exertion.  2-Atrial fibrillation: stable.  -HR controlled  -CHADS score 3 -4  -continue antiarrhythmic  -when ok per urology plan is to resume coumadin  3-Rectus sheath hematoma: no further episodes of bleeding or dropping on Hgb appreciated.  -  CT demonstrating improvement, but still significant pressure on her ureter and bladder from hematoma  -coumadin still on hold  4-Hydronephrosis: status post nephrostomy tube.  -CT w/o contrast reveal resolution of hydronephrosis  -due to still presence of significant pressure of her ureter and  bladder unable to remove nephrostomy tube at this time  -continue dressing changes and follow up with Dr. Jeffie Pollock for further rec's  5-HLD: continue statins  6-hypothyroidism: continue synthroid  7-physical deconditioning: per PT rec's will arrange [facility] services.  8-leukocytosis: due to bronchitis/early PNA and/or steroids use.  Procedures:  See below for x-ray reports  Consultations:  None   Past Medical History  Diagnosis Date  . Hyperlipidemia   . Hypertension   . Hypothyroidism     borderline  . Osteoarthritis of knee     s/p replacement  . Breast mass     left  . Benign head tremor   . Atrial fibrillation 2010    s/p DC cardioversion. on coumadin  . Insomnia   . GERD (gastroesophageal reflux disease)   . Chronic bronchitis     "used to get it alot; not so much anymore" (08/27/2013)  . Sleep apnea     "does not wear mask" (08/27/2013)  . Rectus sheath hematoma 08/27/2013    R after bridging with lovenox  . H/O hiatal hernia     moderate sized/notes  . History of diverticulitis of colon 08/2013    sigmoid with possible microperf/abscess s/p hospitalization complicated by rectus sheath hematoma during lovenox bridge, leading to ARF from hematoma compression on kidney     Review of Systems Per HPI    Objective:   Physical Exam  Nursing note and vitals reviewed. Constitutional: She appears well-developed and well-nourished. No distress.  HENT:  Mouth/Throat: Oropharynx is clear and moist. No oropharyngeal exudate.  Cardiovascular: Normal rate, regular rhythm, normal heart sounds and intact distal pulses.   No murmur heard. Pulmonary/Chest: Effort normal and breath sounds normal. No respiratory distress. She has no wheezes. She has no rales.  Musculoskeletal: She exhibits no edema.       Assessment & Plan:

## 2013-09-24 NOTE — Assessment & Plan Note (Signed)
Sounds regular today. Continue aspirin 81mg  daily.  Continue to hold coumadin. chadsvasc2 = 3 Cards agrees with avoiding lovenox bridge on next recommencement of anticoagulation.

## 2013-09-24 NOTE — Assessment & Plan Note (Signed)
Recheck CBC today. Will await clearing by urology prior to restarting coumadin.

## 2013-09-24 NOTE — Patient Instructions (Addendum)
Pass by Mckenzie Frank's office to check with [facility] on status of physical therapy We will also schedule lung doctor evaluation in 1-2 months. Have Mckenzie Frank call on Monday to schedule urology appointment and let us know when they clear you to be able to restart coumadin. Check blood count today. Good to see you, call us with quesitons.

## 2013-09-25 LAB — CBC WITH DIFFERENTIAL/PLATELET
BASOS ABS: 0 10*3/uL (ref 0.0–0.1)
Basophils Relative: 0 % (ref 0–1)
EOS ABS: 0 10*3/uL (ref 0.0–0.7)
Eosinophils Relative: 0 % (ref 0–5)
HCT: 42.2 % (ref 36.0–46.0)
HEMOGLOBIN: 14 g/dL (ref 12.0–15.0)
Lymphocytes Relative: 10 % — ABNORMAL LOW (ref 12–46)
Lymphs Abs: 1.2 10*3/uL (ref 0.7–4.0)
MCH: 28 pg (ref 26.0–34.0)
MCHC: 33.2 g/dL (ref 30.0–36.0)
MCV: 84.4 fL (ref 78.0–100.0)
MONOS PCT: 5 % (ref 3–12)
Monocytes Absolute: 0.6 10*3/uL (ref 0.1–1.0)
NEUTROS PCT: 85 % — AB (ref 43–77)
Neutro Abs: 10 10*3/uL — ABNORMAL HIGH (ref 1.7–7.7)
Platelets: 403 10*3/uL — ABNORMAL HIGH (ref 150–400)
RBC: 5 MIL/uL (ref 3.87–5.11)
RDW: 16.6 % — AB (ref 11.5–15.5)
WBC: 11.9 10*3/uL — ABNORMAL HIGH (ref 4.0–10.5)

## 2013-09-27 ENCOUNTER — Other Ambulatory Visit: Payer: Self-pay | Admitting: Urology

## 2013-09-27 DIAGNOSIS — N1330 Unspecified hydronephrosis: Secondary | ICD-10-CM

## 2013-10-01 ENCOUNTER — Ambulatory Visit (INDEPENDENT_AMBULATORY_CARE_PROVIDER_SITE_OTHER): Payer: Self-pay | Admitting: Family Medicine

## 2013-10-01 ENCOUNTER — Encounter: Payer: Self-pay | Admitting: Family Medicine

## 2013-10-01 VITALS — BP 140/80 | HR 64 | Temp 98.1°F | Wt 280.5 lb

## 2013-10-01 DIAGNOSIS — S301XXA Contusion of abdominal wall, initial encounter: Secondary | ICD-10-CM

## 2013-10-01 NOTE — Progress Notes (Signed)
Pre-visit discussion using our clinic review tool. No additional management support is needed unless otherwise documented below in the visit note.  

## 2013-10-01 NOTE — Assessment & Plan Note (Signed)
Advised she ask Dr. Jeffie Pollock about restarting coumadin then call us to recommence without bridging for h/o afib.

## 2013-10-01 NOTE — Patient Instructions (Addendum)
Check with Dr. Jeffie Pollock about coumadin and call us when we will restart it.  I would expect restarting once we see blood collection has decreased significantly in size. No charge today.

## 2013-10-01 NOTE — Progress Notes (Signed)
Accidental appointment today - did not cancel.  Next appt recommended in 2-3 months per last office visit. Pt has appt with urology on 2/9 with nephrostomy tube replacement then hopeful for removal 10/18/2013

## 2013-10-11 ENCOUNTER — Other Ambulatory Visit (HOSPITAL_COMMUNITY): Payer: Medicare HMO

## 2013-10-18 DIAGNOSIS — J45909 Unspecified asthma, uncomplicated: Secondary | ICD-10-CM

## 2013-10-18 DIAGNOSIS — S301XXA Contusion of abdominal wall, initial encounter: Secondary | ICD-10-CM

## 2013-10-18 DIAGNOSIS — I4891 Unspecified atrial fibrillation: Secondary | ICD-10-CM

## 2013-10-20 ENCOUNTER — Ambulatory Visit (HOSPITAL_COMMUNITY)
Admission: RE | Admit: 2013-10-20 | Discharge: 2013-10-20 | Disposition: A | Discharge status: Home / Self Care | Payer: Medicare HMO | Source: Ambulatory Visit | Attending: Urology | Admitting: Urology

## 2013-10-20 DIAGNOSIS — N1330 Unspecified hydronephrosis: Secondary | ICD-10-CM

## 2013-10-20 DIAGNOSIS — Z436 Encounter for attention to other artificial openings of urinary tract: Secondary | ICD-10-CM | POA: Insufficient documentation

## 2013-10-20 MED ORDER — IOHEXOL 300 MG/ML  SOLN
10.0000 mL | Freq: Once | INTRAMUSCULAR | Status: AC | PRN
  Administered 2013-10-20: 15 mL

## 2013-10-21 ENCOUNTER — Other Ambulatory Visit (HOSPITAL_COMMUNITY): Payer: Medicare HMO

## 2013-10-27 ENCOUNTER — Other Ambulatory Visit: Payer: Self-pay | Admitting: Urology

## 2013-10-27 DIAGNOSIS — N1330 Unspecified hydronephrosis: Secondary | ICD-10-CM

## 2013-11-01 ENCOUNTER — Ambulatory Visit (HOSPITAL_COMMUNITY)
Admission: RE | Admit: 2013-11-01 | Discharge: 2013-11-01 | Disposition: A | Discharge status: Home / Self Care | Payer: Medicare HMO | Source: Ambulatory Visit | Attending: Urology | Admitting: Urology

## 2013-11-01 DIAGNOSIS — N1330 Unspecified hydronephrosis: Secondary | ICD-10-CM

## 2013-11-01 NOTE — Procedures (Signed)
Patient came to IR to have Right PCN capped.  Leg bag was removed, cap placed on the Right PCN.  New dressing applied to PCN site.  Patient given a new leg bag and instructed to call IR and attach the bag to the PCN if any increase in urine drainage from the site, pain, or fevers.  Patient's family expressed concern that patient lives alone and would not be able to follow these instructions.  Patient has [place] that comes weekly, and patient will call the [facility] RN or the IR department if she needs assistance with the bag.

## 2013-11-04 ENCOUNTER — Ambulatory Visit (INDEPENDENT_AMBULATORY_CARE_PROVIDER_SITE_OTHER): Payer: Medicare HMO | Admitting: Family Medicine

## 2013-11-04 ENCOUNTER — Encounter: Payer: Self-pay | Admitting: Family Medicine

## 2013-11-04 VITALS — BP 140/82 | HR 63 | Temp 97.9°F | Wt 282.2 lb

## 2013-11-04 DIAGNOSIS — J209 Acute bronchitis, unspecified: Secondary | ICD-10-CM

## 2013-11-04 DIAGNOSIS — S3011XA Contusion of abdominal wall, initial encounter: Secondary | ICD-10-CM

## 2013-11-04 DIAGNOSIS — S301XXA Contusion of abdominal wall, initial encounter: Secondary | ICD-10-CM

## 2013-11-04 MED ORDER — AMOXICILLIN-POT CLAVULANATE 875-125 MG PO TABS: 1.0000 | ORAL_TABLET | Freq: Two times a day (BID) | ORAL | Status: AC

## 2013-11-04 NOTE — Assessment & Plan Note (Signed)
Anticipate viral given short duration. advised stop amox she had started at home. rec start xopenex prn wheezing, restart symbicort daily, and provided with [facility] script for augmentin to fill in case not improving or any worsening. Anticipate RAD flares with bronchitis, advised I do want her to see pulm for formal PFTs once feeling better.

## 2013-11-04 NOTE — Progress Notes (Signed)
BP 140/82  Pulse 63  Temp(Src) 97.9 F (36.6 C) (Oral)  Wt 282 lb 4 oz (128.028 kg)  SpO2 97%   CC: "i've picked up a cold"  Subjective:    Patient ID: Mckenzie Frank, female    DOB: 1941-05-08, 73 y.o.   MRN: [id number]  HPI: Mckenzie Frank is a 73 y.o. female presenting on 11/04/2013 with Cough, Nasal Congestion and Wheezing   [place] returns today with concern for respiratory infection.  Hospitalized 09/2013 with RSV bronchitis and early PNA.  Over last 4 days picked up a cold, worried about bronchitis because she is wheezing at night time.  She had leftover amoxicillin and started taking 3d ago.  Cough productive of white sputum.  Wheezy worse at night time.  No dyspnea.    No fevers/chills, abd pain, nausea, head congestion, ear or tooth pain.  No headache.  No ST, no dyspnea. No h/o asthma or COPD.  Does get wheeze with bronchitis, ?RAD.  Ran out of symbicort yesterday.  Has not used xopenex inhaler because didn't felt she needed.  H/o rectus sheath hematoma - latest check by CT (10/2013) decreasing in size from 16 - 13 cm, but rec continued nephrostomy tube.  Still off coumadin.  Will not bridge when we restart coumadin.  Relevant past medical, surgical, family and social history reviewed and updated as indicated.  Allergies and medications reviewed and updated. Current Outpatient Prescriptions on File Prior to Visit  Medication Sig  . acetaminophen (TYLENOL) 500 MG tablet Take 1,000 mg by mouth every 6 (six) hours as needed for moderate pain.  Marland Kitchen amLODipine-benazepril (LOTREL) 10-40 MG per capsule Take 1 capsule by mouth daily.  Marland Kitchen aspirin 81 MG chewable tablet Chew 1 tablet (81 mg total) by mouth daily.  . budesonide-formoterol (SYMBICORT) 160-4.5 MCG/ACT inhaler Inhale 2 puffs into the lungs 2 (two) times daily.  . clonazePAM (KLONOPIN) 0.5 MG tablet Take 0.5 mg by mouth at bedtime.  . flecainide (TAMBOCOR) 100 MG tablet Take 1 tablet (100 mg total) by mouth 2 (two) times  daily.  Marland Kitchen guaiFENesin-dextromethorphan (ROBITUSSIN DM) 100-10 MG/5ML syrup Take 5 mLs by mouth every 4 (four) hours as needed for cough.  . lansoprazole (PREVACID) 15 MG capsule Take 1 capsule (15 mg total) by mouth daily.  Marland Kitchen levalbuterol (XOPENEX HFA) 45 MCG/ACT inhaler Inhale 1-2 puffs into the lungs every 4 (four) hours as needed for wheezing or shortness of breath.  . levothyroxine (SYNTHROID, LEVOTHROID) 25 MCG tablet Take 1 tablet (25 mcg total) by mouth daily.  . metoprolol (LOPRESSOR) 50 MG tablet Take 1 tablet (50 mg total) by mouth 2 (two) times daily.  . simvastatin (ZOCOR) 20 MG tablet Take 0.5 tablets (10 mg total) by mouth every evening.   No current facility-administered medications on file prior to visit.    Review of Systems Per HPI unless specifically indicated above    Objective:    BP 140/82  Pulse 63  Temp(Src) 97.9 F (36.6 C) (Oral)  Wt 282 lb 4 oz (128.028 kg)  SpO2 97%  Physical Exam  Nursing note and vitals reviewed. Constitutional: She appears well-developed and well-nourished. No distress.  HENT:  Head: Normocephalic and atraumatic.  Right Ear: Hearing, tympanic membrane, external ear and ear canal normal.  Left Ear: Hearing, tympanic membrane, external ear and ear canal normal.  Nose: Mucosal edema present. No rhinorrhea. Right sinus exhibits no maxillary sinus tenderness and no frontal sinus tenderness. Left sinus exhibits no maxillary sinus tenderness  and no frontal sinus tenderness.  Mouth/Throat: Uvula is midline, oropharynx is clear and moist and mucous membranes are normal. No oropharyngeal exudate, posterior oropharyngeal edema, posterior oropharyngeal erythema or tonsillar abscesses.  nasla mucosal irritation  Eyes: Conjunctivae and EOM are normal. Pupils are equal, round, and reactive to light. No scleral icterus.  Neck: Normal range of motion. Neck supple.  Cardiovascular: Normal rate, regular rhythm, normal heart sounds and intact distal  pulses.   No murmur heard. regular  Pulmonary/Chest: Effort normal and breath sounds normal. No respiratory distress. She has no wheezes. She has no rales.  Lungs clear  Lymphadenopathy:    She has no cervical adenopathy.  Skin: Skin is warm and dry. No rash noted.       Assessment & Plan:   Problem List Items Addressed This Visit   Acute bronchitis - Primary     Anticipate viral given short duration. advised stop amox she had started at home. rec start xopenex prn wheezing, restart symbicort daily, and provided with [facility] script for augmentin to fill in case not improving or any worsening. Anticipate RAD flares with bronchitis, advised I do want her to see pulm for formal PFTs once feeling better.    Rectus sheath hematoma     Still awaiting urological clearance prior to restarting coumadin.        Follow up plan: Return if symptoms worsen or fail to improve.

## 2013-11-04 NOTE — Progress Notes (Signed)
Pre visit review using our clinic review tool, if applicable. No additional management support is needed unless otherwise documented below in the visit note. 

## 2013-11-04 NOTE — Assessment & Plan Note (Signed)
Still awaiting urological clearance prior to restarting coumadin.

## 2013-11-04 NOTE — Patient Instructions (Signed)
I do want you to see lung doctors, but may wait until you're feeling better - call us to schedule this appointment. I think you do have upper respiratory infection - use xopenex as needed for wheezing.Marland Kitchen  Restart symbicort (should be available at pharmacy). Push fluids and rest, may continue cough syrup at night time. Stop amoxicillin.  Hold augmentin prescription in case not improving with above.

## 2013-11-10 ENCOUNTER — Telehealth: Payer: Self-pay

## 2013-11-10 NOTE — Telephone Encounter (Signed)
Noted. Will route to [facility] to re establish with coumadin clinic for afib - with no bridging. Will also route to [place] referral placed 09/24/2013

## 2013-11-10 NOTE — Telephone Encounter (Signed)
Appt scheduled with Coumadin Clinic.

## 2013-11-10 NOTE — Telephone Encounter (Signed)
Pt said tube removed on 11/08/13 and pt was to notify Dr Danise Mina so Coumadin would be restarted. Pt request cb. Pt said OK to do referral for pulmonologist; pt can go for appt next week. (this note also sent to [facility] for referral).

## 2013-11-12 ENCOUNTER — Institutional Professional Consult (permissible substitution): Payer: Medicare HMO | Admitting: Internal Medicine

## 2013-11-15 ENCOUNTER — Telehealth: Payer: Self-pay | Admitting: Internal Medicine

## 2013-11-15 ENCOUNTER — Encounter: Payer: Self-pay | Admitting: Internal Medicine

## 2013-11-15 ENCOUNTER — Emergency Department: Payer: Self-pay | Admitting: Emergency Medicine

## 2013-11-15 ENCOUNTER — Ambulatory Visit (INDEPENDENT_AMBULATORY_CARE_PROVIDER_SITE_OTHER): Payer: Commercial Managed Care - HMO | Admitting: Internal Medicine

## 2013-11-15 ENCOUNTER — Ambulatory Visit (INDEPENDENT_AMBULATORY_CARE_PROVIDER_SITE_OTHER): Payer: Medicare HMO | Admitting: Family Medicine

## 2013-11-15 VITALS — BP 146/88 | HR 64 | Temp 97.6°F | Ht 67.5 in | Wt 284.0 lb

## 2013-11-15 DIAGNOSIS — Z7901 Long term (current) use of anticoagulants: Secondary | ICD-10-CM

## 2013-11-15 DIAGNOSIS — Z5181 Encounter for therapeutic drug level monitoring: Secondary | ICD-10-CM

## 2013-11-15 DIAGNOSIS — I10 Essential (primary) hypertension: Secondary | ICD-10-CM

## 2013-11-15 DIAGNOSIS — J45909 Unspecified asthma, uncomplicated: Secondary | ICD-10-CM

## 2013-11-15 DIAGNOSIS — I4891 Unspecified atrial fibrillation: Secondary | ICD-10-CM

## 2013-11-15 LAB — POCT INR: INR: 1

## 2013-11-15 NOTE — Patient Instructions (Signed)
Try off symbicort to see if it makes any difference and if you find you can't do without it then you will need a trial off Lotrel as the next step then another trial off the symbicort when your present prescription runs out.

## 2013-11-15 NOTE — Progress Notes (Signed)
Subjective:    Patient ID: Mckenzie Frank, female    DOB: 1941/04/26   MRN: [id number]  HPI  73 yowf never smoker with no previous tendency to allergy/ resp problems until 1st week in Jan 2015 dx as RSV B pneumonia and dx as persistent post viral bronchiolitis referred by Dr Chryl Heck .11/15/13 to pulmonary after the following admit:   Admit date: 09/14/2013  Discharge date: 09/19/2013  Recommendations for Outpatient Follow-up:  CBC to follow WBC's trend and Hgb  BMET to follow electrolytes and  Follow up with pulmonologist and urologist  Discharge Diagnoses:  Acute resp failure due to RSV B bronchitis/bronchiolitis and early PNA  Atrial fibrillation  Hydronephrosis  Rectal hematoma sheath  HLD  Hypothyroidism  Physical deconditioning  Discharge Condition: stable and improved. Will discharge home with home health services  Diet recommendation: heart healthy diet  Filed Weights    09/17/13 0356  09/18/13 0514  09/19/13 0634   Weight:  130.001 kg (286 lb 9.6 oz)  128.2 kg (282 lb 10.1 oz)  127.4 kg (280 lb 13.9 oz)   History of present illness:  73 y.o. year old female with significant past medical history of HTN, afib, diverticulosis with recent diverticulitis, as recent admission for urinary obstruction in setting of abdominal wall hematoma s/p nephrectomy tube placement here with resp distress and wheezing. Pt is noted to have had a fairly complicated medical course including admission for diverticulitis w/ resolution and development of abdominal wall hematoma in setting on anticoagulation bridiging w/ secondary urinary obstruction s/p perc nephrectomy tube placement. Pt was seen at her PCPs office 09/13/13 when pt reported having some URI sxs with cough and wheezing. Pt was given depomedrol 80mg  IM x1 as well as course of augmentin. No prior hx/o asthma, COPD or smoking in the past. + flu shot this year. No fevers or chills. Minimal myalgias. Mild rhinorrhea and nasal congestion. Pt  states that she had persistent wheezing, cough and resp distress despite treatment. Pt then reported to ER because of worsening sxs.  In the ER, CXR was obtained that showed no focal infiltrate. Pt was given multiple CAT treatments as well as IV solumedrol with mild improvement in sxs, though with still persistent increased WOB.  Hospital Course:  1-Acute resp failure with hypoxia due to RSV B and bronchitis/early PNA:  -patient continue improving.  -minimally wheezing at discharge  -will discharge on tapering prednisone, levaquin treatment and will start symbicort and PRN xopenex  -patient with some findings on CXR that suggest asthma/COPD  -will benefit of PFT's and pulmonology follow up in outpatient setting  -will discharge for oxygen use at home especially on exertion.  2-Atrial fibrillation: stable.  -HR controlled  -CHADS score 3 -4  -continue antiarrhythmic  -when ok per urology plan is to resume coumadin  3-Rectus sheath hematoma: no further episodes of bleeding or dropping on Hgb appreciated.  -CT demonstrating improvement, but still significant pressure on her ureter and bladder from hematoma  -coumadin still on hold  4-Hydronephrosis: status post nephrostomy tube.  -CT w/o contrast reveal resolution of hydronephrosis  -due to still presence of significant pressure of her ureter and bladder unable to remove nephrostomy tube at this time  -continue dressing changes and follow up with Dr. Jeffie Pollock for further rec's  5-HLD: continue statins  6-hypothyroidism: continue synthroid  7-physical deconditioning: per PT rec's will arrange [facility] services.  8-leukocytosis: due to bronchitis/early PNA and/or steroids use   11/15/2013 1st [facility] office visit/  Mckenzie Frank  Chief Complaint  Patient presents with  . Pulmonary Consult    Referred per Dr. Danise Mina for eval of reactive airway dz.  Pt reports has had bronchitis recently. She denies any respiratory co's today.    still taking  symbicort but did not take day of ov and not sure she needs it now and no cough and Not limited by breathing from desired activities    No obvious other patterns in day to day or daytime variabilty or assoc chronic cough or cp or chest tightness, subjective wheeze overt sinus or hb symptoms. No unusual exp hx or h/o childhood pna/ asthma or knowledge of premature birth.  Sleeping ok without nocturnal  or early am exacerbation  of respiratory  c/o's or need for noct saba. Also denies any obvious fluctuation of symptoms with weather or environmental changes or other aggravating or alleviating factors except as outlined above   Current Medications, Allergies, Complete Past Medical History, Past Surgical History, Family History, and Social History were reviewed in [employer] record.            Review of Systems  Constitutional: Negative for fever, chills and unexpected weight change.  HENT: Negative for congestion, dental problem, ear pain, nosebleeds, postnasal drip, rhinorrhea, sinus pressure, sneezing, sore throat, trouble swallowing and voice change.   Eyes: Negative for visual disturbance.  Respiratory: Negative for cough, choking and shortness of breath.   Cardiovascular: Negative for chest pain and leg swelling.  Gastrointestinal: Negative for vomiting, abdominal pain and diarrhea.  Genitourinary: Negative for difficulty urinating.  Musculoskeletal: Negative for arthralgias.  Skin: Negative for rash.  Neurological: Negative for tremors, syncope and headaches.  Hematological: Does not bruise/bleed easily.       Objective:   Physical Exam  amb wf nad who failed to answer a single question asked in a straightforward manner, tending to go off on tangents or answer questions with ambiguous medical terms or diagnoses and seemed aggravated  when asked the same question more than once for clarification.  "my bronchitis picked up bronchiolitis"   Wt Readings from  Last 3 Encounters:  11/15/13 284 lb (128.822 kg)  11/04/13 282 lb 4 oz (128.028 kg)  10/01/13 280 lb 8 oz (127.234 kg)      HEENT: nl dentition, turbinates, and orophanx. Nl external ear canals without cough reflex   NECK :  without JVD/Nodes/TM/ nl carotid upstrokes bilaterally   LUNGS: no acc muscle use, clear to A and P bilaterally without cough on insp or exp maneuvers   CV:  RRR  no s3 or murmur or increase in P2, no edema   ABD:  soft and nontender with nl excursion in the supine position. No bruits or organomegaly, bowel sounds nl  MS:  warm without deformities, calf tenderness, cyanosis or clubbing  SKIN: warm and dry without lesions    NEURO:  alert, approp, no deficits    cxr 09/14/13 No active disease.   Spirometry 11/15/13 wnl        Assessment & Plan:

## 2013-11-15 NOTE — Telephone Encounter (Signed)
Spoke with pt in lobby.  Pt has appt for consult with Dr Melvyn Novas at 2:15.  Pt concerned about rash type bumps on her face, arms and legs.  No rash, redness or irritation noted.  No signs pf pulmonary distress.  Re assured pt we will have Dr wert address these issues when she sees him today.  She verbalized understanding.

## 2013-11-16 NOTE — Assessment & Plan Note (Signed)
ACE inhibitors are problematic in  pts with airway complaints because  even experienced pulmonologists can't always distinguish ace effects from copd/asthma/pnds/ allergies etc.  By themselves they don't actually cause a problem, much like oxygen can't by itself start a fire, but they certainly serve as a powerful catalyst or enhancer for any "fire"  or inflammatory process in the upper airway, be it caused by an ET  tube or more commonly reflux (especially in the obese or pts with known GERD or who are on biphoshonates) or URI's, due to interference with bradykinin clearance.  The effects of acei on bradykinin levels occurs in 100% of pt's on acei (unless they surreptitiously stop the med!) but the classic cough is only reported in 5%.  This leaves 95% of pts on acei's  with a variety of syndromes including no identifiable symptom in most  vs non-specific symptoms that wax and wane depending on what other insult is occuring at the level of the upper airway, which may be the case here - ? Viral  related, as she no longer has symptoms.  Strongly rec trial off acei if symptoms flare off symbicort rather than committing to longterm rx for a dz she didn't have until 2 months prior to OV

## 2013-11-16 NOTE — Assessment & Plan Note (Signed)
-   Spriometry 10/31/13 FEV1  1.83 (76%) ratio 70 s symbicort in over 18 h - The proper method of use, as well as anticipated side effects, of a metered-dose inhaler are discussed and demonstrated to the patient. Improved effectiveness after extensive coaching during this visit to a level of approximately  50% but no better  From baseline of < 10%   Based on how poorly she used hfa at baseline I'm not sure she's really needed it and question whether she has asthma vs   c Upper airway cough syndrome, so named because it's frequently impossible to sort out how much is  CR/sinusitis with freq throat clearing (which can be related to primary GERD)   vs  causing  secondary (" extra esophageal")  GERD from wide swings in gastric pressure that occur with throat clearing, often  promoting self use of mint and menthol lozenges that reduce the lower esophageal sphincter tone and exacerbate the problem further in a cyclical fashion.   These are the same pts (now being labeled as having "irritable larynx syndrome" by some cough centers) who not infrequently have a history of having failed to tolerate ace inhibitors,  dry powder inhalers or biphosphonates or report having atypical reflux symptoms that don't respond to standard doses of PPI , and are easily confused as having aecopd or asthma flares by even experienced allergists/ pulmonologists.   Would rec trial off symbicort and if symptoms flare then stop lotrel x 4 weeks and rechallenge off symbicort again before I woiuld commit her to longterm rx with ics/laba as prior to her recent illness she never had any asthma at all.

## 2013-11-19 ENCOUNTER — Ambulatory Visit (INDEPENDENT_AMBULATORY_CARE_PROVIDER_SITE_OTHER): Payer: Medicare HMO | Admitting: Family Medicine

## 2013-11-19 ENCOUNTER — Encounter: Payer: Self-pay | Admitting: Family Medicine

## 2013-11-19 VITALS — BP 146/84 | HR 68 | Temp 97.4°F | Wt 286.5 lb

## 2013-11-19 DIAGNOSIS — I10 Essential (primary) hypertension: Secondary | ICD-10-CM

## 2013-11-19 DIAGNOSIS — I4891 Unspecified atrial fibrillation: Secondary | ICD-10-CM

## 2013-11-19 DIAGNOSIS — J45909 Unspecified asthma, uncomplicated: Secondary | ICD-10-CM

## 2013-11-19 DIAGNOSIS — G4700 Insomnia, unspecified: Secondary | ICD-10-CM

## 2013-11-19 DIAGNOSIS — T7840XA Allergy, unspecified, initial encounter: Secondary | ICD-10-CM

## 2013-11-19 DIAGNOSIS — S301XXA Contusion of abdominal wall, initial encounter: Secondary | ICD-10-CM

## 2013-11-19 MED ORDER — LOSARTAN POTASSIUM 50 MG PO TABS: 50.0000 mg | ORAL_TABLET | Freq: Every day | ORAL | Status: DC

## 2013-11-19 MED ORDER — AMLODIPINE BESYLATE 10 MG PO TABS: 10.0000 mg | ORAL_TABLET | Freq: Every day | ORAL | Status: DC

## 2013-11-19 NOTE — Assessment & Plan Note (Signed)
Deteriorated with prednisone use.

## 2013-11-19 NOTE — Assessment & Plan Note (Signed)
Recently restarted coumadin today.  I do want to touch base with Dr. Jeffie Pollock regarding her plan for restarting coumadin as f/u CT was not done. I've asked her to schedule appt with Dr. Fletcher Anon for f/u a fib.

## 2013-11-19 NOTE — Progress Notes (Signed)
Pre visit review using our clinic review tool, if applicable. No additional management support is needed unless otherwise documented below in the visit note. 

## 2013-11-19 NOTE — Assessment & Plan Note (Signed)
See above.  Unclear if cleared by urology.  I have a phone call in to Dr. Ralene Muskrat office as I'd like to touch base with him re any needed f/u for hematoma. S/p nephrostomy tube removal.

## 2013-11-19 NOTE — Assessment & Plan Note (Signed)
Per pulm possible airway irritation from ACEI - so will stop lotrel and start amlodipine 10mg  daily along with losartan 50mg  daily. Pt agrees with plan.

## 2013-11-19 NOTE — Assessment & Plan Note (Signed)
Recent allergy with urticaria evaluated at [facility] ER.  Unclear inciting cause. ?amox as she had just completed augmentin course.   Currently without any rash/reaction. Will hold off on allergist referral for now, ok to stop prednisone, will monitor closely for return of rash/hives. She has epi pen at home.

## 2013-11-19 NOTE — Patient Instructions (Addendum)
Pass by Marion's office to schedule urology appointment for 12/09/2013. Ok to stop symbicort. Continue albuterol just if needed. ok to stop prednisone now.  Watch for return of rash or hives.  If this happens then let me know for referral to allergist. Stop amlodipine/benazepril (lotrel).  Start plain amlodipine 10mg  and losartan 50mg  daily. Continue other medicines as up to now. Let me know how blood pressure is doing.

## 2013-11-19 NOTE — Progress Notes (Signed)
BP 146/84  Pulse 68  Temp(Src) 97.4 F (36.3 C) (Oral)  Wt 286 lb 8 oz (129.956 kg)   CC: ERf/u  Subjective:    Patient ID: Mckenzie Frank, female    DOB: 07/13/1941, 73 y.o.   MRN: [id number]  HPI: Mckenzie Frank is a 73 y.o. female presenting on 11/19/2013 for Follow-up and Referral   Mckenzie Frank presents today for ER f/u visit.  She was recently seen at [facility] ER on 11/16/2013 with hives.  Records reviewed.  Allergic reaction to unidentified substance.  Treated with benadryl and prednisone.  Provided with epi pen.  Prednisone causing restlessness.  Denies dyspnea or throat swelling or fevers.  No new meds, lotions, detergents, soaps or shampoos that she knows of.  She did just recently start showering again (had not previously 2/2 nephrostomy status).  Denies new foods.  She had just finished 10d course of augmentin for bronchitis.  See prior note for details - nephrostomy tube finally removed (for retroperitoneal hematoma after lovenox bridge for coumadin).  Coumadin has been restarted 11/15/2013.  She did not have f/u CT scan to monitor retroperitoneal bleed. Lab Results  Component Value Date   INR 1.0 11/15/2013   INR 1.33 08/31/2013   INR 1.47 08/30/2013    She recently saw Dr. Melvyn Novas with concern not for asthma but rather possible ACEI induced airway reactivity.  Suggested trial off ACEI.  Rec trial off symbicort.   Requests referral for rpt urology appt on 12/09/2013 with NP.  Relevant past medical, surgical, family and social history reviewed and updated as indicated.  Allergies and medications reviewed and updated. Current Outpatient Prescriptions on File Prior to Visit  Medication Sig  . acetaminophen (TYLENOL) 500 MG tablet Take 1,000 mg by mouth every 6 (six) hours as needed for moderate pain.  . clonazePAM (KLONOPIN) 0.5 MG tablet Take 0.5 mg by mouth at bedtime.  . flecainide (TAMBOCOR) 100 MG tablet Take 1 tablet (100 mg total) by mouth 2 (two) times daily.  . lansoprazole  (PREVACID) 15 MG capsule Take 1 capsule (15 mg total) by mouth daily.  Marland Kitchen levalbuterol (XOPENEX HFA) 45 MCG/ACT inhaler Inhale 1-2 puffs into the lungs every 4 (four) hours as needed for wheezing or shortness of breath.  . levothyroxine (SYNTHROID, LEVOTHROID) 25 MCG tablet Take 1 tablet (25 mcg total) by mouth daily.  . metoprolol (LOPRESSOR) 50 MG tablet Take 1 tablet (50 mg total) by mouth 2 (two) times daily.  . simvastatin (ZOCOR) 20 MG tablet Take 0.5 tablets (10 mg total) by mouth every evening.  . warfarin (COUMADIN) 5 MG tablet Take 5 mg by mouth as directed.   No current facility-administered medications on file prior to visit.    Review of Systems Per HPI unless specifically indicated above    Objective:    BP 146/84  Pulse 68  Temp(Src) 97.4 F (36.3 C) (Oral)  Wt 286 lb 8 oz (129.956 kg)  Physical Exam  Nursing note and vitals reviewed. Constitutional: She appears well-developed and well-nourished. No distress.  obese  HENT:  Mouth/Throat: Oropharynx is clear and moist. No oropharyngeal exudate.  Cardiovascular: Normal rate, regular rhythm, normal heart sounds and intact distal pulses.   No murmur heard. Regular today  Pulmonary/Chest: Effort normal and breath sounds normal. No respiratory distress. She has no wheezes. She has no rales.  Musculoskeletal: She exhibits no edema.  Lymphadenopathy:    She has no cervical adenopathy.   Results for orders placed in visit on  11/15/13  POCT INR      Result Value Ref Range   INR 1.0        Assessment & Plan:   Problem List Items Addressed This Visit   Allergic reaction     Recent allergy with urticaria evaluated at [facility] ER.  Unclear inciting cause. ?amox as she had just completed augmentin course.   Currently without any rash/reaction. Will hold off on allergist referral for now, ok to stop prednisone, will monitor closely for return of rash/hives. She has epi pen at home.    Atrial fibrillation     Recently  restarted coumadin today.  I do want to touch base with Dr. Jeffie Pollock regarding her plan for restarting coumadin as f/u CT was not done. I've asked her to schedule appt with Dr. Fletcher Anon for f/u a fib.    Relevant Medications      amLODIpine (NORVASC) tablet      losartan (COZAAR) tablet   Hypertension     Per pulm possible airway irritation from ACEI - so will stop lotrel and start amlodipine 10mg  daily along with losartan 50mg  daily. Pt agrees with plan.    Insomnia     Deteriorated with prednisone use.    Intrinsic asthma     Negative evaluation for asthma at pulm clinic 10/2013. Will do trial off symbicort, and also stop lotrel.  See above.    Relevant Medications      predniSONE (STERAPRED UNI-PAK) 10 MG tablet   Rectus sheath hematoma - Primary     See above.  Unclear if cleared by urology.  I have a phone call in to Dr. Ralene Muskrat office as I'd like to touch base with him re any needed f/u for hematoma. S/p nephrostomy tube removal.    Relevant Orders      Ambulatory referral to Urology       Follow up plan: Return if symptoms worsen or fail to improve, for as needed.

## 2013-11-19 NOTE — Assessment & Plan Note (Signed)
Negative evaluation for asthma at pulm clinic 10/2013. Will do trial off symbicort, and also stop lotrel.  See above.

## 2013-11-22 ENCOUNTER — Telehealth: Payer: Self-pay | Admitting: Family Medicine

## 2013-11-22 ENCOUNTER — Telehealth: Payer: Self-pay

## 2013-11-22 NOTE — Telephone Encounter (Signed)
Relevant patient education assigned to patient using Emmi. ° °

## 2013-11-22 NOTE — Telephone Encounter (Signed)
Pt left v/m; pt was seen 11/19/13 and discussed problem with present med not helping insomnia; pt request different med sent to [place] for insomnia.Please advise.

## 2013-11-22 NOTE — Telephone Encounter (Signed)
Has sleep gotten any better with discontinuing prednisone? May try trazodone 25mg  nightly for sleep, reassess next visit.

## 2013-11-23 ENCOUNTER — Telehealth: Payer: Self-pay | Admitting: Family Medicine

## 2013-11-23 MED ORDER — TRAZODONE 25 MG HALF TABLET: 25.0000 mg | ORAL_TABLET | Freq: Every day | ORAL | Status: DC

## 2013-11-23 NOTE — Telephone Encounter (Signed)
Pt's friend left vm saying that Dr. Danise Mina wrote a letter for pt to get a 2 bedroom apt but the wording needs to say that she "needs space for a medical apparatus".  Cb [id number].

## 2013-11-23 NOTE — Telephone Encounter (Signed)
What medical apparatus does she need?  Has she restarted using CPAP machine?

## 2013-11-23 NOTE — Telephone Encounter (Signed)
Spoke to pts daughter Madlyn Frankel who states that she has not received any medical equipment as of yet. She was wanting the letter in the event that she receives O2 tanks and she would need space for an air mattress should a family member need to stay with her.

## 2013-11-24 NOTE — Telephone Encounter (Signed)
Left vm letting pt know letter ready for pick up at front desk.

## 2013-11-24 NOTE — Telephone Encounter (Signed)
Pt says she has been able to sleep well for the past 2 nights.  Informed her that I would call Trazadone in to pharmacy (it was printed) and she could use that if she needed it. Pt verbalized understanding.

## 2013-11-24 NOTE — Telephone Encounter (Signed)
Spoke with patient and daughter.  New letter written and placed in Kim's box.

## 2013-11-25 ENCOUNTER — Ambulatory Visit (INDEPENDENT_AMBULATORY_CARE_PROVIDER_SITE_OTHER): Payer: Commercial Managed Care - HMO | Admitting: Cardiovascular Disease

## 2013-11-25 ENCOUNTER — Encounter: Payer: Self-pay | Admitting: Cardiovascular Disease

## 2013-11-25 VITALS — BP 110/78 | HR 58 | Ht 67.0 in | Wt 281.5 lb

## 2013-11-25 DIAGNOSIS — I10 Essential (primary) hypertension: Secondary | ICD-10-CM

## 2013-11-25 DIAGNOSIS — I4891 Unspecified atrial fibrillation: Secondary | ICD-10-CM

## 2013-11-25 NOTE — Patient Instructions (Signed)
Continue same medications.   Your physician wants you to follow-up in: 6 months.  You will receive a reminder letter in the mail two months in advance. If you don't receive a letter, please call our office to schedule the follow-up appointment.  

## 2013-11-25 NOTE — Progress Notes (Signed)
Primary care physician: Dr. Danise Mina  HPI  This is a 73 year old female who is here today for a follow up visit regarding atrial fibrillation.  She has known history of paroxysmal atrial fibrillation diagnosed in 2010. She had cardioversion done once at that time. It seems that she had recurrent atrial fibrillation in 2011 and was placed on flecainide by Dr. Gillian Shields her previous cardiologist.  She reports having a cardiac catheterization many years ago by Dr. Clayborn Bigness which showed no significant coronary artery disease.  Echocardiogram in April of 2014 showed normal LV systolic function, mild left ventricular hypertrophy and only mild mitral regurgitation.  She was hospitalized in January with diverticulitis. She developed rectus sheath hematoma from Lovenox injection which was given as bridging. She had multiple complications related to that and required nephrostomy tubes. She is not doing better. She denied chest pain, dyspnea or palpitations.  Allergies  Allergen Reactions  . Paxil [Paroxetine Hcl] Other (See Comments)    Shaking   . Clindamycin/Lincomycin Other (See Comments)    unknown  . Codeine Other (See Comments)    unknown  . Doxycycline Other (See Comments)    unknown  . Promethazine Other (See Comments)    unknown  . Sulfa Antibiotics Hives  . Valium Other (See Comments)    unknown     Current Outpatient Prescriptions on File Prior to Visit  Medication Sig Dispense Refill  . acetaminophen (TYLENOL) 500 MG tablet Take 1,000 mg by mouth every 6 (six) hours as needed for moderate pain.      Marland Kitchen amLODipine (NORVASC) 10 MG tablet Take 1 tablet (10 mg total) by mouth daily.  30 tablet  6  . clonazePAM (KLONOPIN) 0.5 MG tablet Take 0.5 mg by mouth at bedtime.      . flecainide (TAMBOCOR) 100 MG tablet Take 1 tablet (100 mg total) by mouth 2 (two) times daily.  180 tablet  0  . lansoprazole (PREVACID) 15 MG capsule Take 1 capsule (15 mg total) by mouth daily.  90 capsule  0  .  levalbuterol (XOPENEX HFA) 45 MCG/ACT inhaler Inhale 1-2 puffs into the lungs every 4 (four) hours as needed for wheezing or shortness of breath.  1 Inhaler  1  . levothyroxine (SYNTHROID, LEVOTHROID) 25 MCG tablet Take 1 tablet (25 mcg total) by mouth daily.  90 tablet  1  . losartan (COZAAR) 50 MG tablet Take 1 tablet (50 mg total) by mouth daily.  30 tablet  3  . metoprolol (LOPRESSOR) 50 MG tablet Take 1 tablet (50 mg total) by mouth 2 (two) times daily.  180 tablet  0  . simvastatin (ZOCOR) 20 MG tablet Take 0.5 tablets (10 mg total) by mouth every evening.  45 tablet  0  . traZODone (DESYREL) 25 mg TABS tablet Take 0.5 tablets (25 mg total) by mouth at bedtime.  30 tablet  3  . warfarin (COUMADIN) 5 MG tablet Take 5 mg by mouth as directed.       No current facility-administered medications on file prior to visit.     Past Medical History  Diagnosis Date  . Hyperlipidemia   . Hypertension   . Hypothyroidism     borderline  . Osteoarthritis of knee     s/p replacement  . Breast mass     left  . Benign head tremor   . Atrial fibrillation 2010    s/p DC cardioversion. on coumadin  . Insomnia   . GERD (gastroesophageal reflux disease)   .  Chronic bronchitis     "used to get it alot; not so much anymore" (08/27/2013)  . Sleep apnea     "does not wear mask" (08/27/2013)  . Rectus sheath hematoma 08/27/2013    R after bridging with lovenox  . H/O hiatal hernia     moderate sized/notes  . History of diverticulitis of colon 08/2013    sigmoid with possible microperf/abscess s/p hospitalization complicated by rectus sheath hematoma during lovenox bridge, leading to ARF from hematoma compression on kidney     Past Surgical History  Procedure Laterality Date  . Replacement total knee Bilateral 2005  . Appendectomy  1947  . Dilation and curettage of uterus    . Cholecystectomy  1980  . Cardiac catheterization      [facility];Dr. Clayborn Bigness  . Dexa  10/2012    WNL  . Tonsillectomy      . Joint replacement    . Abdominal exploration surgery  1971    "to see if I needed hysterectomy; dr thought qthing looked too good so he didn't do a hysterectomy at this time" (08/27/2013)  . Vaginal hysterectomy  1971    "2 wks after 1st dr said it was ok" (08/27/2013)  . Cardioversion      [facility] 08/27/2013  . Percutaneous nephrostomy  08/2014    ARF from hematoma compression     Family History  Problem Relation Age of Onset  . CAD Mother     angina  . Cancer Paternal Grandfather     ?  Marland Kitchen Alzheimer's disease Father     died from PNA  . Diabetes Mother   . Stroke Mother 89     History   Social History  . Marital Status: Widowed    Spouse Name: N/A    Number of Children: N/A  . Years of Education: N/A   Occupational History  . Retired    Social History Main Topics  . Smoking status: Never Smoker   . Smokeless tobacco: Never Used  . Alcohol Use: No  . Drug Use: No  . Sexual Activity: No   Other Topics Concern  . Not on file   Social History Narrative   Caffeine: 1 cup coffee/day   Lives alone in senior living.  Daughter in [place]   Occupation: retired, Scientist, research (medical)   Edu: HS   Activity: walks the track   Diet:      PHYSICAL EXAM   BP 110/78  Pulse 58  Ht 5\' 7"  (1.702 m)  Wt 281 lb 8 oz (127.688 kg)  BMI 44.08 kg/m2 Constitutional: She is oriented to person, place, and time. She appears well-developed and well-nourished. No distress.  HENT: No nasal discharge.  Head: Normocephalic and atraumatic.  Eyes: Pupils are equal and round. Right eye exhibits no discharge. Left eye exhibits no discharge.  Neck: Normal range of motion. Neck supple. No JVD present. No thyromegaly present.  Cardiovascular: Normal rate, regular rhythm, normal heart sounds. Exam reveals no gallop and no friction rub. No murmur heard.  Pulmonary/Chest: Effort normal and breath sounds normal. No stridor. No respiratory distress. She has no wheezes. She has no rales. She exhibits no  tenderness.  Abdominal: Soft. Bowel sounds are normal. She exhibits no distension. There is no tenderness. There is no rebound and no guarding.  Musculoskeletal: Normal range of motion. She exhibits no edema and no tenderness.  Neurological: She is alert and oriented to person, place, and time. Coordination normal.  Skin: Skin is warm and dry.  No rash noted. She is not diaphoretic. No erythema. No pallor.  Psychiatric: She has a normal mood and affect. Her behavior is normal. Judgment and thought content normal.     EKG: Sinus  Bradycardia  WITHIN NORMAL LIMITS    ASSESSMENT AND PLAN

## 2013-11-26 ENCOUNTER — Encounter: Payer: Self-pay | Admitting: Cardiovascular Disease

## 2013-11-26 NOTE — Assessment & Plan Note (Signed)
She is stable from a cardiac standpoint in maintaining normal sinus rhythm on flecainide and metoprolol. She has no symptoms suggestive of breakthrough arrhythmia. She is on long-term anticoagulation with warfarin. She has no previous cardioembolic complications. I do not think she requires bridging in the future especially with all the complications she had with Lovenox injections. The other option would be to switch her to a NOAC medication in the future.

## 2013-11-26 NOTE — Assessment & Plan Note (Signed)
Blood pressure was controlled on current medications. 

## 2013-11-29 ENCOUNTER — Telehealth: Payer: Self-pay | Admitting: Family Medicine

## 2013-11-29 ENCOUNTER — Ambulatory Visit (INDEPENDENT_AMBULATORY_CARE_PROVIDER_SITE_OTHER): Payer: Medicare HMO | Admitting: Family Medicine

## 2013-11-29 DIAGNOSIS — Z7901 Long term (current) use of anticoagulants: Secondary | ICD-10-CM

## 2013-11-29 DIAGNOSIS — I4891 Unspecified atrial fibrillation: Secondary | ICD-10-CM

## 2013-11-29 DIAGNOSIS — Z5181 Encounter for therapeutic drug level monitoring: Secondary | ICD-10-CM

## 2013-11-29 LAB — POCT INR: INR: 2.5

## 2013-11-29 NOTE — Telephone Encounter (Signed)
Patient notified as instructed by telephone. 

## 2013-11-29 NOTE — Telephone Encounter (Signed)
Pt states she has been taking Trazadone 1/2 tablet since Wednesday of last week and it has not helped her to sleep.  She did stop taking the Clonazepam and wonders if she should start that back or increase Trazadone dosage.

## 2013-11-29 NOTE — Telephone Encounter (Signed)
Let's increase trazodone to 1 whole tablet daily for next several days then call us with update.

## 2013-12-10 ENCOUNTER — Telehealth: Payer: Self-pay

## 2013-12-10 NOTE — Telephone Encounter (Signed)
Pt request refill of "all meds" to [facility]. I asked pt the names of the medicines she needed refilled and pt brought Maudie Mercury a list of meds for her next refill with paperwork from [facility] to be submitted when next refill due. Pt said she has about 2 weeks of med left and to leave message for Maudie Mercury. Not sure where paper work is kept. Pt request cb when refilled.

## 2013-12-13 MED ORDER — LANSOPRAZOLE 15 MG PO CPDR: 15.0000 mg | DELAYED_RELEASE_CAPSULE | Freq: Every day | ORAL | Status: DC

## 2013-12-13 MED ORDER — LEVOTHYROXINE SODIUM 25 MCG PO TABS: 25.0000 ug | ORAL_TABLET | Freq: Every day | ORAL | Status: DC

## 2013-12-13 MED ORDER — METOPROLOL TARTRATE 50 MG PO TABS: 50.0000 mg | ORAL_TABLET | Freq: Two times a day (BID) | ORAL | Status: DC

## 2013-12-13 MED ORDER — LOSARTAN POTASSIUM 50 MG PO TABS: 50.0000 mg | ORAL_TABLET | Freq: Every day | ORAL | Status: DC

## 2013-12-13 MED ORDER — WARFARIN SODIUM 5 MG PO TABS: 5.0000 mg | ORAL_TABLET | ORAL | Status: AC

## 2013-12-13 MED ORDER — AMLODIPINE BESYLATE 10 MG PO TABS: 10.0000 mg | ORAL_TABLET | Freq: Every day | ORAL | Status: DC

## 2013-12-13 MED ORDER — FLECAINIDE ACETATE 100 MG PO TABS: 100.0000 mg | ORAL_TABLET | Freq: Two times a day (BID) | ORAL | Status: DC

## 2013-12-13 MED ORDER — SIMVASTATIN 20 MG PO TABS: 10.0000 mg | ORAL_TABLET | Freq: Every evening | ORAL | Status: DC

## 2013-12-13 NOTE — Telephone Encounter (Signed)
Meds refilled to [facility]. Message left notifying patient.

## 2013-12-17 ENCOUNTER — Telehealth: Payer: Self-pay | Admitting: Family Medicine

## 2013-12-17 NOTE — Telephone Encounter (Signed)
[facility] faxed over paperwork to be looked over and signed for pt billing purposes.  Requested to be faxed before Friday at [place].  Given to Dr. Damita Dunnings since Dr. Danise Mina is out of town.

## 2013-12-21 NOTE — Telephone Encounter (Signed)
I honestly don't know.  I completed everything on Friday before I left and placed the things to be scanned in the folder at [employer].  It may have been in that stack and I don't remember the name.

## 2013-12-21 NOTE — Telephone Encounter (Signed)
Mckenzie Frank-I wasn't here Friday. Do you remember if this got taken care of?

## 2013-12-22 NOTE — Telephone Encounter (Signed)
It was signed at clinic and then put in my outbox, but that was after 5PM and it wasn't picked up until the first run on Monday.

## 2013-12-22 NOTE — Telephone Encounter (Signed)
Dr. Damita Dunnings was going to take this home on Friday and work on it over the weekend.

## 2013-12-27 ENCOUNTER — Ambulatory Visit (INDEPENDENT_AMBULATORY_CARE_PROVIDER_SITE_OTHER): Payer: Medicare HMO | Admitting: Family Medicine

## 2013-12-27 DIAGNOSIS — Z7901 Long term (current) use of anticoagulants: Secondary | ICD-10-CM

## 2013-12-27 DIAGNOSIS — Z5181 Encounter for therapeutic drug level monitoring: Secondary | ICD-10-CM

## 2013-12-27 DIAGNOSIS — I4891 Unspecified atrial fibrillation: Secondary | ICD-10-CM

## 2013-12-27 LAB — POCT INR: INR: 2.2

## 2013-12-30 ENCOUNTER — Encounter: Payer: Self-pay | Admitting: Family Medicine

## 2013-12-30 ENCOUNTER — Ambulatory Visit (INDEPENDENT_AMBULATORY_CARE_PROVIDER_SITE_OTHER): Payer: Commercial Managed Care - HMO | Admitting: Family Medicine

## 2013-12-30 VITALS — BP 138/72 | HR 64 | Temp 97.5°F | Wt 284.0 lb

## 2013-12-30 DIAGNOSIS — G252 Other specified forms of tremor: Secondary | ICD-10-CM

## 2013-12-30 DIAGNOSIS — I10 Essential (primary) hypertension: Secondary | ICD-10-CM

## 2013-12-30 DIAGNOSIS — Z1231 Encounter for screening mammogram for malignant neoplasm of breast: Secondary | ICD-10-CM

## 2013-12-30 DIAGNOSIS — G250 Essential tremor: Secondary | ICD-10-CM

## 2013-12-30 DIAGNOSIS — J309 Allergic rhinitis, unspecified: Secondary | ICD-10-CM

## 2013-12-30 MED ORDER — FLUTICASONE PROPIONATE 50 MCG/ACT NA SUSP: 2.0000 | Freq: Every day | NASAL | Status: DC

## 2013-12-30 MED ORDER — TRIAMCINOLONE ACETONIDE 0.1 % EX CREA: 1.0000 "application " | TOPICAL_CREAM | Freq: Two times a day (BID) | CUTANEOUS | Status: DC

## 2013-12-30 NOTE — Assessment & Plan Note (Signed)
Stable on current regimen - continue. 

## 2013-12-30 NOTE — Progress Notes (Signed)
BP 138/72  Pulse 64  Temp(Src) 97.5 F (36.4 C) (Oral)  Wt 284 lb (128.822 kg)  SpO2 96%   CC: cough  Subjective:    Patient ID: Mckenzie Frank, female    DOB: [birth year], 73 y.o.   MRN: [id number]  HPI: Mckenzie Frank is a 73 y.o. female presenting on 12/30/2013 for Acute Visit   Pleasant 73 yo presents today with 2wk h/o cough, congestion.  Yesterday awoke with puffy eyelids and watery eyes.  At night time feeling some wheezing as well.  Has used xopenex inhaler prn - may have caused rapid heart rate and foot swelling.  sxs worse with increase in pollen over last 2 weeks.  + PNdrainage.  Cough productive of mild white sputum.  No h/o allergic rhinitis.  No h/o asthma. No fevers/chills, ear or tooth pain, headaches.  Trazodone didn't help with sleep so she has stopped med.  Now on klonopin 0.5mg  nightly.  Last visit we changed bp meds around - brings a few readings - 130-144/50-80.  Off symbicort and lotrel.  H/o hospitalization earlier in the year for acute resp failure due to RSV B bronchitis/bronchiolitis and early PNA.  Seen pulm in f/u, advised did not have asthma or RAD, rec trial off symbicort and lotrel.  H/o intensely pruritic dermatitis worse in summers - treated with TCI cream which pt requests today.  Relevant past medical, surgical, family and social history reviewed and updated as indicated.  Allergies and medications reviewed and updated. Current Outpatient Prescriptions on File Prior to Visit  Medication Sig  . amLODipine (NORVASC) 10 MG tablet Take 1 tablet (10 mg total) by mouth daily.  . clonazePAM (KLONOPIN) 0.5 MG tablet Take 0.5 mg by mouth at bedtime.  . flecainide (TAMBOCOR) 100 MG tablet Take 1 tablet (100 mg total) by mouth 2 (two) times daily.  . lansoprazole (PREVACID) 15 MG capsule Take 1 capsule (15 mg total) by mouth daily.  Marland Kitchen levothyroxine (SYNTHROID, LEVOTHROID) 25 MCG tablet Take 1 tablet (25 mcg total) by mouth daily.  Marland Kitchen losartan (COZAAR)  50 MG tablet Take 1 tablet (50 mg total) by mouth daily.  . metoprolol (LOPRESSOR) 50 MG tablet Take 1 tablet (50 mg total) by mouth 2 (two) times daily.  . predniSONE (STERAPRED UNI-PAK) 10 MG tablet Take by mouth.   . simvastatin (ZOCOR) 20 MG tablet Take 0.5 tablets (10 mg total) by mouth every evening.  . warfarin (COUMADIN) 5 MG tablet Take 1 tablet (5 mg total) by mouth as directed.  Marland Kitchen acetaminophen (TYLENOL) 500 MG tablet Take 1,000 mg by mouth every 6 (six) hours as needed for moderate pain.  Marland Kitchen levalbuterol (XOPENEX HFA) 45 MCG/ACT inhaler Inhale 1-2 puffs into the lungs every 4 (four) hours as needed for wheezing or shortness of breath.   No current facility-administered medications on file prior to visit.    Review of Systems Per HPI unless specifically indicated above    Objective:    BP 138/72  Pulse 64  Temp(Src) 97.5 F (36.4 C) (Oral)  Wt 284 lb (128.822 kg)  SpO2 96%  Physical Exam  Nursing note and vitals reviewed. Constitutional: She appears well-developed and well-nourished. No distress.  HENT:  Head: Normocephalic and atraumatic.  Right Ear: Hearing, tympanic membrane, external ear and ear canal normal.  Left Ear: Hearing, tympanic membrane, external ear and ear canal normal.  Nose: No mucosal edema or rhinorrhea. Right sinus exhibits no maxillary sinus tenderness and no frontal sinus tenderness. Left  sinus exhibits no maxillary sinus tenderness and no frontal sinus tenderness.  Mouth/Throat: Uvula is midline, oropharynx is clear and moist and mucous membranes are normal. No oropharyngeal exudate, posterior oropharyngeal edema, posterior oropharyngeal erythema or tonsillar abscesses.  Mild mucosal erythema  Eyes: Conjunctivae and EOM are normal. Pupils are equal, round, and reactive to light. No scleral icterus.  Neck: Normal range of motion. Neck supple.  Cardiovascular: Normal rate, regular rhythm, normal heart sounds and intact distal pulses.   No murmur  heard. Pulmonary/Chest: Effort normal and breath sounds normal. No respiratory distress. She has no wheezes. She has no rales.  No wheezing or pseudowheezing appreciated today  Lymphadenopathy:    She has no cervical adenopathy.  Skin: Skin is warm and dry. No rash noted.       Assessment & Plan:   Problem List Items Addressed This Visit   Hypertension     Stable on current regimen - continue    Benign head tremor     Noted stable persistent titubations     Allergic rhinitis     Describes symptoms of allergic rhinitis, predominant rhinitis. Treat with zyrtec, flonase, and nasal saline irrigation - allergen avoidance precautions discussed. See pt instructions.     Other Visit Diagnoses   Other screening mammogram    -  Primary    Relevant Orders       MM DIGITAL SCREENING BILATERAL        Follow up plan: Return if symptoms worsen or fail to improve.

## 2013-12-30 NOTE — Assessment & Plan Note (Signed)
Noted stable persistent titubations

## 2013-12-30 NOTE — Progress Notes (Signed)
Pre visit review using our clinic review tool, if applicable. No additional management support is needed unless otherwise documented below in the visit note. 

## 2013-12-30 NOTE — Assessment & Plan Note (Signed)
Describes symptoms of allergic rhinitis, predominant rhinitis. Treat with zyrtec, flonase, and nasal saline irrigation - allergen avoidance precautions discussed. See pt instructions.

## 2013-12-30 NOTE — Patient Instructions (Signed)
May use triamcinolone cream for itchy leg rash I have placed referral for mammogram at [facility].  If you haven't heard from [place] in 1-2 weeks call us to follow up. I do think this is allergy related - carry nasal saline irrigation with you and if you're outside in yard for a long period of time, when you come in take a shower. Start claritin or zyrtec daily (over the counter), start flonase daily as well (sent to pharmacy). Let me know if not improving with above.

## 2013-12-31 ENCOUNTER — Telehealth: Payer: Self-pay | Admitting: Family Medicine

## 2013-12-31 NOTE — Telephone Encounter (Signed)
Relevant patient education assigned to patient using Emmi. ° °

## 2014-01-21 ENCOUNTER — Other Ambulatory Visit: Payer: Self-pay | Admitting: Family Medicine

## 2014-01-21 NOTE — Telephone Encounter (Signed)
plz phone in. 

## 2014-01-21 NOTE — Telephone Encounter (Signed)
Ok to refill 

## 2014-01-21 NOTE — Telephone Encounter (Signed)
Rx called in as directed.   

## 2014-01-27 ENCOUNTER — Ambulatory Visit (INDEPENDENT_AMBULATORY_CARE_PROVIDER_SITE_OTHER): Payer: Commercial Managed Care - HMO | Admitting: Family Medicine

## 2014-01-27 ENCOUNTER — Telehealth: Payer: Self-pay | Admitting: Family Medicine

## 2014-01-27 DIAGNOSIS — Z5181 Encounter for therapeutic drug level monitoring: Secondary | ICD-10-CM

## 2014-01-27 DIAGNOSIS — Z7901 Long term (current) use of anticoagulants: Secondary | ICD-10-CM

## 2014-01-27 DIAGNOSIS — I4891 Unspecified atrial fibrillation: Secondary | ICD-10-CM

## 2014-01-27 LAB — POCT INR: INR: 2.5

## 2014-01-28 ENCOUNTER — Telehealth: Payer: Self-pay | Admitting: *Deleted

## 2014-01-28 ENCOUNTER — Ambulatory Visit (INDEPENDENT_AMBULATORY_CARE_PROVIDER_SITE_OTHER): Payer: Commercial Managed Care - HMO | Admitting: Family Medicine

## 2014-01-28 ENCOUNTER — Encounter: Payer: Self-pay | Admitting: Family Medicine

## 2014-01-28 VITALS — BP 148/90 | HR 61 | Temp 97.5°F | Wt 288.8 lb

## 2014-01-28 DIAGNOSIS — N644 Mastodynia: Secondary | ICD-10-CM

## 2014-01-28 MED ORDER — CEPHALEXIN 500 MG PO CAPS: 500.0000 mg | ORAL_CAPSULE | Freq: Three times a day (TID) | ORAL | Status: DC

## 2014-01-28 NOTE — Telephone Encounter (Signed)
Faxed medical expense verification to our billing dept 5/29.

## 2014-01-28 NOTE — Addendum Note (Signed)
Addended by: Ria Bush on: 01/28/2014 10:21 AM   Modules accepted: Orders
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
## 2014-01-28 NOTE — Patient Instructions (Addendum)
Pass by [facility]'s office to schedule diagnostic mammogram for today if able - we will call you with results. As we are unable to get sooner mammogram, we will start antibiotic for possible infection. Let me know if worsening pain, spreading redness, fevers. Start keflex 500mg  three times daily - call coumadin clinic to notify them you're on this med and for any changes in coumadin dosing needed.

## 2014-01-28 NOTE — Progress Notes (Signed)
Pre visit review using our clinic review tool, if applicable. No additional management support is needed unless otherwise documented below in the visit note. 

## 2014-01-28 NOTE — Assessment & Plan Note (Addendum)
Exam with marked tenderness at L breast and slight induration but no obvious fluctuance/abscess. Unclear etiology.  Order diagnostic R breast mammo with R [place] if needed, then discuss plan.  We were unable to schedule mammogram sooner than 10 days at breast center or [place] - so I will start keflex 500mg  tid for 7 d course to treat possible mild breast cellulitis. Red flags to return discussed. Doxy, sulfa, clinda allergies in chart. Lab Results  Component Value Date   CREATININE 0.80 09/19/2013

## 2014-02-07 ENCOUNTER — Other Ambulatory Visit: Payer: Commercial Managed Care - HMO

## 2014-02-11 NOTE — Telephone Encounter (Signed)
error 

## 2014-02-16 ENCOUNTER — Other Ambulatory Visit: Payer: Self-pay | Admitting: Family Medicine

## 2014-02-16 NOTE — Telephone Encounter (Signed)
Last filled 01/21/14

## 2014-02-17 NOTE — Telephone Encounter (Signed)
plz phoen in. 

## 2014-02-17 NOTE — Telephone Encounter (Signed)
Rx called in as directed.   

## 2014-02-21 ENCOUNTER — Ambulatory Visit: Payer: Self-pay | Admitting: Family Medicine

## 2014-02-24 ENCOUNTER — Ambulatory Visit: Payer: Commercial Managed Care - HMO

## 2014-02-25 ENCOUNTER — Other Ambulatory Visit: Payer: Self-pay | Admitting: Family Medicine

## 2014-03-03 ENCOUNTER — Other Ambulatory Visit: Payer: Self-pay | Admitting: Family Medicine

## 2014-09-20 ENCOUNTER — Encounter: Payer: Self-pay | Admitting: Family Medicine

## 2014-09-20 ENCOUNTER — Ambulatory Visit (INDEPENDENT_AMBULATORY_CARE_PROVIDER_SITE_OTHER): Payer: Commercial Managed Care - HMO | Admitting: Family Medicine

## 2014-09-20 VITALS — BP 144/86 | HR 68 | Temp 97.6°F | Wt 293.5 lb

## 2014-09-20 DIAGNOSIS — I482 Chronic atrial fibrillation, unspecified: Secondary | ICD-10-CM

## 2014-09-20 DIAGNOSIS — I4891 Unspecified atrial fibrillation: Secondary | ICD-10-CM | POA: Diagnosis not present

## 2014-09-20 DIAGNOSIS — R21 Rash and other nonspecific skin eruption: Secondary | ICD-10-CM | POA: Diagnosis not present

## 2014-09-20 DIAGNOSIS — Z7901 Long term (current) use of anticoagulants: Secondary | ICD-10-CM | POA: Diagnosis not present

## 2014-09-20 DIAGNOSIS — Z5181 Encounter for therapeutic drug level monitoring: Secondary | ICD-10-CM | POA: Diagnosis not present

## 2014-09-20 LAB — POCT INR: INR: 3.7

## 2014-09-20 MED ORDER — DEXAMETHASONE SODIUM PHOSPHATE 10 MG/ML IJ SOLN
10.0000 mg | Freq: Once | INTRAMUSCULAR | Status: AC
  Administered 2014-09-20: 10 mg via INTRAMUSCULAR

## 2014-09-20 MED ORDER — CLOBETASOL PROPIONATE 0.05 % EX CREA: 1.0000 "application " | TOPICAL_CREAM | Freq: Every evening | CUTANEOUS | Status: DC | PRN
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
## 2014-09-20 NOTE — Progress Notes (Signed)
BP 144/86 mmHg  Pulse 68  Temp(Src) 97.6 F (36.4 C) (Oral)  Wt 293 lb 8 oz (133.131 kg)   CC: check skin burn  Subjective:    Patient ID: Mckenzie Frank, female    DOB: [birth year], 74 y.o.   MRN: [id number]  HPI: Mckenzie Frank is a 74 y.o. female presenting on 09/20/2014 for Burn   Not seen since 12/2013. Moved to [place]. In process of returning here.  Burn bilateral lower legs from hot water in the shower on Thursday morning. Since then has had pruritic rash L>R lower leg. No pain, no fever. TCI cream has helped but only temporarily.  Requests PT/INR checked today. Takes warfarin 5mg  daily with 1/2 tab on Fridays. Unsure last INR but states normal, advised to f/u 5 wks (today is 5 wks).  Relevant past medical, surgical, family and social history reviewed and updated as indicated. Interim medical history since our last visit reviewed. Allergies and medications reviewed and updated. Current Outpatient Prescriptions on File Prior to Visit  Medication Sig  . acetaminophen (TYLENOL) 500 MG tablet Take 1,000 mg by mouth every 6 (six) hours as needed for moderate pain.  Marland Kitchen amLODipine (NORVASC) 10 MG tablet Take 1 tablet (10 mg total) by mouth daily.  . cetirizine (ZYRTEC) 10 MG tablet Take 10 mg by mouth daily.  . clonazePAM (KLONOPIN) 0.5 MG tablet TAKE 1 TABLET BY MOUTH EVERY NIGHT AT BEDTIME  . flecainide (TAMBOCOR) 100 MG tablet Take 1 tablet (100 mg total) by mouth 2 (two) times daily.  . fluticasone (FLONASE) 50 MCG/ACT nasal spray Place 2 sprays into both nostrils daily.  . lansoprazole (PREVACID) 15 MG capsule Take 1 capsule (15 mg total) by mouth daily.  Marland Kitchen levalbuterol (XOPENEX HFA) 45 MCG/ACT inhaler Inhale 1-2 puffs into the lungs every 4 (four) hours as needed for wheezing or shortness of breath.  . levothyroxine (SYNTHROID, LEVOTHROID) 25 MCG tablet Take 1 tablet (25 mcg total) by mouth daily.  Marland Kitchen losartan (COZAAR) 50 MG tablet Take 1 tablet (50 mg total) by mouth  daily.  . metoprolol (LOPRESSOR) 50 MG tablet Take 1 tablet (50 mg total) by mouth 2 (two) times daily.  . simvastatin (ZOCOR) 20 MG tablet Take 0.5 tablets (10 mg total) by mouth every evening.  . triamcinolone cream (KENALOG) 0.1 % APPLY TO AFFECTED AREA TOPICALLY TWICE DAILY  . warfarin (COUMADIN) 5 MG tablet Take 1 tablet (5 mg total) by mouth as directed. (Patient taking differently: Take 5 mg by mouth as directed. 5 mg daily and then one-half on Friday)   No current facility-administered medications on file prior to visit.    Review of Systems Per HPI unless specifically indicated above     Objective:    BP 144/86 mmHg  Pulse 68  Temp(Src) 97.6 F (36.4 C) (Oral)  Wt 293 lb 8 oz (133.131 kg)  Wt Readings from Last 3 Encounters:  09/20/14 293 lb 8 oz (133.131 kg)  01/28/14 288 lb 12 oz (130.976 kg)  12/30/13 284 lb (128.822 kg)    Physical Exam  Constitutional: She appears well-developed and well-nourished. No distress.  Morbidly obese  Skin: Skin is warm and dry. Rash noted.  Pruritic petechial rash bilateral lower legs L>R Excoriations with scabs lower part of lower legs  Nursing note and vitals reviewed.  Results for orders placed or performed in visit on 01/27/14  POCT INR  Result Value Ref Range   INR 2.5  Assessment & Plan:  I've asked her to return for medicare wellness visit at her convenience Problem List Items Addressed This Visit    Skin rash - Primary    Petechial pruritic rash due to burn with scalding water last week - with burst capillaries as skin damage.  Given pruritis - treat with dexamethasone 1mg  shot IM today.  Discussed regular moisturizing during the day for added skin protection. Add on clotrimazole cream PRN QHS for pruritis. Discussed red flags to indicate need for urgent evaluation (streaking redness, fevers, etc)      Atrial fibrillation    Check INR today, dose accordingly.          Follow up plan: Return if symptoms  worsen or fail to improve.

## 2014-09-20 NOTE — Addendum Note (Signed)
Addended by: Daralene Milch C on: 09/20/2014 12:06 PM   Modules accepted: Orders

## 2014-09-20 NOTE — Assessment & Plan Note (Signed)
Petechial pruritic rash due to burn with scalding water last week - with burst capillaries as skin damage.  Given pruritis - treat with dexamethasone 1mg  shot IM today.  Discussed regular moisturizing during the day for added skin protection. Add on clotrimazole cream PRN QHS for pruritis. Discussed red flags to indicate need for urgent evaluation (streaking redness, fevers, etc)

## 2014-09-20 NOTE — Assessment & Plan Note (Signed)
Check INR today, dose accordingly.

## 2014-09-20 NOTE — Progress Notes (Signed)
Pre visit review using our clinic review tool, if applicable. No additional management support is needed unless otherwise documented below in the visit note. 

## 2014-10-04 ENCOUNTER — Other Ambulatory Visit (INDEPENDENT_AMBULATORY_CARE_PROVIDER_SITE_OTHER): Payer: Commercial Managed Care - HMO

## 2014-10-04 DIAGNOSIS — I482 Chronic atrial fibrillation, unspecified: Secondary | ICD-10-CM

## 2014-10-04 DIAGNOSIS — Z5181 Encounter for therapeutic drug level monitoring: Secondary | ICD-10-CM

## 2014-10-04 DIAGNOSIS — I4891 Unspecified atrial fibrillation: Secondary | ICD-10-CM

## 2014-10-04 DIAGNOSIS — Z7901 Long term (current) use of anticoagulants: Secondary | ICD-10-CM

## 2014-10-04 LAB — POCT INR: INR: 3.4

## 2014-10-20 ENCOUNTER — Telehealth: Payer: Self-pay | Admitting: *Deleted

## 2014-10-20 NOTE — Telephone Encounter (Signed)
  Pt calling stating that she needs to be seen that, she's just not feeling well.  Gets out of breath when she takes a few steps.  She is taking her lasix daily  .Her breathing is worse at night when she goes to bed  She changes positions but it does not help  Her SOB is better when sitting  She wants to see Dr Fletcher Anon         Patient scheduled to see Dr. Fletcher Anon 2/18 Patient instructed to contact EMS if her situation becomes emergent

## 2014-10-20 NOTE — Telephone Encounter (Signed)
Pt calling stating that she needs to be seen that, she's just not feeling well.  Gets out of breath when she takes a few steps.  Has heart shocked Is on heart medication Just gotten worst. Changes at night when she goes to bed. Changes sides doesn't feel like that is working.  When sitting, feels the same.  But wants to just come in to see him She states she is on the way.  She thinks her heart may be out of rhythm . Please advise  Pt c/o Shortness Of Breath: STAT if SOB developed within the last 24 hours or pt is noticeably SOB on the phone  1. Are you currently SOB (can you hear that pt is SOB on the phone)? no  2. How long have you been experiencing SOB? 2 to 3 days   3. Are you SOB when sitting or when up moving around? both  4. Are you currently experiencing any other symptoms? Chest is heavy.

## 2014-10-21 ENCOUNTER — Encounter: Payer: Self-pay | Admitting: *Deleted

## 2014-10-21 ENCOUNTER — Encounter: Payer: Self-pay | Admitting: Cardiovascular Disease

## 2014-10-21 ENCOUNTER — Ambulatory Visit: Payer: Self-pay | Admitting: Cardiovascular Disease

## 2014-10-21 ENCOUNTER — Ambulatory Visit (INDEPENDENT_AMBULATORY_CARE_PROVIDER_SITE_OTHER): Payer: Commercial Managed Care - HMO | Admitting: Cardiovascular Disease

## 2014-10-21 ENCOUNTER — Telehealth: Payer: Self-pay | Admitting: *Deleted

## 2014-10-21 VITALS — BP 169/89 | HR 100 | Ht 67.5 in | Wt 286.2 lb

## 2014-10-21 DIAGNOSIS — R079 Chest pain, unspecified: Secondary | ICD-10-CM

## 2014-10-21 DIAGNOSIS — I10 Essential (primary) hypertension: Secondary | ICD-10-CM

## 2014-10-21 DIAGNOSIS — I4891 Unspecified atrial fibrillation: Secondary | ICD-10-CM

## 2014-10-21 NOTE — Telephone Encounter (Signed)
Informed patient her cardioversion is scheduled for 10/25/14 at 0830 Patient to arrive at 0730 Dr. Rockey Situ to perform  Patient verbalized understanding

## 2014-10-21 NOTE — Progress Notes (Signed)
Primary care physician: Dr. Danise Mina  HPI  This is a 74 year old female who is here today for a follow up visit regarding atrial fibrillation.  She has known history of persistent atrial fibrillation diagnosed in 2010. She had cardioversion done once at that time. It seems that she had recurrent atrial fibrillation in 2011 and was placed on flecainide by Dr. Gillian Shields her previous cardiologist.  She reports having a cardiac catheterization many years ago by Dr. Clayborn Bigness which showed no significant coronary artery disease.  Echocardiogram in April of 2014 showed normal LV systolic function, mild left ventricular hypertrophy and only mild mitral regurgitation.  She was hospitalized in January, 2015 with diverticulitis. She developed rectus sheath hematoma from Lovenox injection which was given as bridging. She had multiple complications related to that and required nephrostomy tubes.  She moved to live close to her children and has not been seen since March. She might be moving back to [place] and requested to be seen today as she has not been feeling well over the last few weeks with increased dyspnea and palpitations. She is noted to be in atrial fibrillation today.   Allergies  Allergen Reactions  . Paxil [Paroxetine Hcl] Other (See Comments)    Shaking   . Clindamycin/Lincomycin Other (See Comments)    unknown  . Codeine Other (See Comments)    unknown  . Doxycycline Other (See Comments)    unknown  . Promethazine Other (See Comments)    unknown  . Sulfa Antibiotics Hives  . Valium Other (See Comments)    unknown     Current Outpatient Prescriptions on File Prior to Visit  Medication Sig Dispense Refill  . acetaminophen (TYLENOL) 500 MG tablet Take 1,000 mg by mouth every 6 (six) hours as needed for moderate pain.    Marland Kitchen amLODipine (NORVASC) 10 MG tablet Take 1 tablet (10 mg total) by mouth daily. 90 tablet 1  . clobetasol cream (TEMOVATE) 3.88 % Apply 1 application topically at  bedtime as needed. Apply to AA nightly for no more than 1 wk at a time 45 g 0  . clonazePAM (KLONOPIN) 0.5 MG tablet TAKE 1 TABLET BY MOUTH EVERY NIGHT AT BEDTIME 30 tablet 0  . flecainide (TAMBOCOR) 100 MG tablet Take 1 tablet (100 mg total) by mouth 2 (two) times daily. 180 tablet 1  . lansoprazole (PREVACID) 15 MG capsule Take 1 capsule (15 mg total) by mouth daily. 90 capsule 1  . levothyroxine (SYNTHROID, LEVOTHROID) 25 MCG tablet Take 1 tablet (25 mcg total) by mouth daily. 90 tablet 1  . losartan (COZAAR) 50 MG tablet Take 1 tablet (50 mg total) by mouth daily. 90 tablet 1  . metoprolol (LOPRESSOR) 50 MG tablet Take 1 tablet (50 mg total) by mouth 2 (two) times daily. 180 tablet 1  . simvastatin (ZOCOR) 20 MG tablet Take 0.5 tablets (10 mg total) by mouth every evening. 45 tablet 1  . triamcinolone cream (KENALOG) 0.1 % APPLY TO AFFECTED AREA TOPICALLY TWICE DAILY (Patient taking differently: APPLY TO AFFECTED AREA TOPICALLY AS NEEDED) 120 g 0  . warfarin (COUMADIN) 5 MG tablet Take 1 tablet (5 mg total) by mouth as directed. (Patient taking differently: Take 5 mg by mouth as directed. 5 mg daily and then one-half on Friday) 90 tablet 1  . fluticasone (FLONASE) 50 MCG/ACT nasal spray Place 2 sprays into both nostrils daily. (Patient not taking: Reported on 10/21/2014) 16 g 3   No current facility-administered medications on file prior to  visit.     Past Medical History  Diagnosis Date  . Hyperlipidemia   . Hypertension   . Hypothyroidism     borderline  . Osteoarthritis of knee     s/p replacement  . Abnormal mammogram     left medial breast - benign fat necrosis with residual calcification  . Benign head tremor   . Atrial fibrillation 2010    s/p DC cardioversion. on coumadin  . Insomnia   . GERD (gastroesophageal reflux disease)   . Chronic bronchitis     "used to get it alot; not so much anymore" (08/27/2013)  . Sleep apnea     "does not wear mask" (08/27/2013)  . Rectus  sheath hematoma 08/27/2013    R after bridging with lovenox  . H/O hiatal hernia     moderate sized/notes  . History of diverticulitis of colon 08/2013    sigmoid with possible microperf/abscess s/p hospitalization complicated by rectus sheath hematoma during lovenox bridge, leading to ARF from hematoma compression on kidney     Past Surgical History  Procedure Laterality Date  . Replacement total knee Bilateral 2005  . Appendectomy  1947  . Dilation and curettage of uterus    . Cholecystectomy  1980  . Cardiac catheterization      [facility];Dr. Clayborn Bigness  . Dexa  10/2012    WNL  . Tonsillectomy    . Joint replacement    . Abdominal exploration surgery  1971    "to see if I needed hysterectomy; dr thought qthing looked too good so he didn't do a hysterectomy at this time" (08/27/2013)  . Vaginal hysterectomy  1971    "2 wks after 1st dr said it was ok" (08/27/2013)  . Cardioversion      Archie Endo 08/27/2013  . Percutaneous nephrostomy  08/2013    ARF from hematoma compression     Family History  Problem Relation Age of Onset  . CAD Mother     angina  . Cancer Paternal Grandfather     ?  Marland Kitchen Alzheimer's disease Father     died from PNA  . Diabetes Mother   . Stroke Mother 10     History   Social History  . Marital Status: Widowed    Spouse Name: N/A  . Number of Children: N/A  . Years of Education: N/A   Occupational History  . Retired    Social History Main Topics  . Smoking status: Never Smoker   . Smokeless tobacco: Never Used  . Alcohol Use: No  . Drug Use: No  . Sexual Activity: No   Other Topics Concern  . Not on file   Social History Narrative   Caffeine: 1 cup coffee/day   Lives alone in senior living.  Daughter in [place]   Occupation: retired, Scientist, research (medical)   Edu: HS   Activity: walks the track   Diet:      PHYSICAL EXAM   BP 169/89 mmHg  Pulse 100  Ht 5' 7.5" (1.715 m)  Wt 286 lb 4 oz (129.842 kg)  BMI 44.15 kg/m2  SpO2 98% Constitutional: She  is oriented to person, place, and time. She appears well-developed and well-nourished. No distress.  HENT: No nasal discharge.  Head: Normocephalic and atraumatic.  Eyes: Pupils are equal and round. Right eye exhibits no discharge. Left eye exhibits no discharge.  Neck: Normal range of motion. Neck supple. No JVD present. No thyromegaly present.  Cardiovascular: Normal rate, regular rhythm, normal heart sounds. Exam reveals  no gallop and no friction rub. No murmur heard.  Pulmonary/Chest: Effort normal and breath sounds normal. No stridor. No respiratory distress. She has no wheezes. She has no rales. She exhibits no tenderness.  Abdominal: Soft. Bowel sounds are normal. She exhibits no distension. There is no tenderness. There is no rebound and no guarding.  Musculoskeletal: Normal range of motion. She exhibits no edema and no tenderness.  Neurological: She is alert and oriented to person, place, and time. Coordination normal.  Skin: Skin is warm and dry. No rash noted. She is not diaphoretic. No erythema. No pallor.  Psychiatric: She has a normal mood and affect. Her behavior is normal. Judgment and thought content normal.     EKG: Atrial fibrillation  -Nonspecific QRS widening.   ABNORMAL RHYTHM    ASSESSMENT AND PLAN

## 2014-10-21 NOTE — Patient Instructions (Signed)
Your physician has recommended that you have a Cardioversion (DCCV). Electrical Cardioversion uses a jolt of electricity to your heart either through paddles or wired patches attached to your chest. This is a controlled, usually prescheduled, procedure. Defibrillation is done under light anesthesia in the hospital, and you usually go home the day of the procedure. This is done to get your heart back into a normal rhythm. You are not awake for the procedure. Please see the instruction sheet given to you today.  Your physician recommends that you return for lab work in:  CBC  BMP  INR

## 2014-10-22 NOTE — Assessment & Plan Note (Signed)
Blood pressure is elevated today but she seems to be anxious.

## 2014-10-22 NOTE — Assessment & Plan Note (Signed)
The patient is currently in atrial fibrillation and seems to be symptomatic. Continue treatment with metoprolol and flecainide. She is on anticoagulation with warfarin. Given her symptoms, I think the best option is to proceed with cardioversion. Risks and benefits were explained. Check labs today and if INR is above 2, proceed with cardioversion early next week. Her INR has been therapeutic the last 2 months.

## 2014-10-24 ENCOUNTER — Other Ambulatory Visit: Payer: Self-pay

## 2014-10-24 DIAGNOSIS — I4891 Unspecified atrial fibrillation: Secondary | ICD-10-CM

## 2014-10-24 NOTE — Telephone Encounter (Signed)
Pt scheduled for cardioversion with Dr. Rockey Situ 10/25/14 at 0830 am  Patient to arrive at 0730  Patient verbalized understanding   Orders faxed to cath lab  [facility] on specials conformed receipt

## 2014-10-25 ENCOUNTER — Ambulatory Visit: Payer: Self-pay | Admitting: Cardiovascular Disease

## 2014-10-25 DIAGNOSIS — I4891 Unspecified atrial fibrillation: Secondary | ICD-10-CM

## 2014-10-27 ENCOUNTER — Other Ambulatory Visit (INDEPENDENT_AMBULATORY_CARE_PROVIDER_SITE_OTHER): Payer: Commercial Managed Care - HMO

## 2014-10-27 ENCOUNTER — Other Ambulatory Visit: Payer: Self-pay | Admitting: *Deleted

## 2014-10-27 DIAGNOSIS — Z7901 Long term (current) use of anticoagulants: Secondary | ICD-10-CM

## 2014-10-27 DIAGNOSIS — Z5181 Encounter for therapeutic drug level monitoring: Secondary | ICD-10-CM

## 2014-10-27 DIAGNOSIS — I482 Chronic atrial fibrillation, unspecified: Secondary | ICD-10-CM

## 2014-10-27 DIAGNOSIS — I4891 Unspecified atrial fibrillation: Secondary | ICD-10-CM

## 2014-10-27 LAB — POCT INR: INR: 2.9

## 2014-10-28 MED ORDER — FUROSEMIDE 40 MG PO TABS: 40.0000 mg | ORAL_TABLET | Freq: Two times a day (BID) | ORAL | Status: DC

## 2014-10-28 MED ORDER — LOSARTAN POTASSIUM 50 MG PO TABS: 50.0000 mg | ORAL_TABLET | Freq: Every day | ORAL | Status: DC

## 2014-10-28 MED ORDER — AMLODIPINE BESYLATE 10 MG PO TABS: 10.0000 mg | ORAL_TABLET | Freq: Every day | ORAL | Status: DC

## 2014-10-28 MED ORDER — LEVOTHYROXINE SODIUM 25 MCG PO TABS: 25.0000 ug | ORAL_TABLET | Freq: Every day | ORAL | Status: DC

## 2014-10-28 MED ORDER — METOPROLOL TARTRATE 50 MG PO TABS: 50.0000 mg | ORAL_TABLET | Freq: Two times a day (BID) | ORAL | Status: DC

## 2014-10-28 MED ORDER — SIMVASTATIN 20 MG PO TABS: 10.0000 mg | ORAL_TABLET | Freq: Every evening | ORAL | Status: DC

## 2014-11-04 ENCOUNTER — Encounter: Payer: Self-pay | Admitting: Cardiovascular Disease

## 2014-11-07 ENCOUNTER — Ambulatory Visit (INDEPENDENT_AMBULATORY_CARE_PROVIDER_SITE_OTHER): Payer: Commercial Managed Care - HMO | Admitting: Cardiovascular Disease

## 2014-11-07 ENCOUNTER — Encounter: Payer: Self-pay | Admitting: Cardiovascular Disease

## 2014-11-07 VITALS — BP 134/80 | HR 58 | Ht 67.0 in | Wt 282.8 lb

## 2014-11-07 DIAGNOSIS — I10 Essential (primary) hypertension: Secondary | ICD-10-CM

## 2014-11-07 DIAGNOSIS — G4730 Sleep apnea, unspecified: Secondary | ICD-10-CM

## 2014-11-07 DIAGNOSIS — I482 Chronic atrial fibrillation, unspecified: Secondary | ICD-10-CM

## 2014-11-07 DIAGNOSIS — G478 Other sleep disorders: Secondary | ICD-10-CM

## 2014-11-07 NOTE — Assessment & Plan Note (Signed)
She is back into sinus rhythm after recent successful cardioversion. Continue current treatment with flecainide and metoprolol. I think we have to make sure that sleep apnea is being treated.

## 2014-11-07 NOTE — Patient Instructions (Signed)
Continue same medications.   Your physician wants you to follow-up in: 6 months.  You will receive a reminder letter in the mail two months in advance. If you don't receive a letter, please call our office to schedule the follow-up appointment.  

## 2014-11-07 NOTE — Assessment & Plan Note (Signed)
She reports some form of sleep disorder but currently is not being treated. If she does have significant sleep apnea, this might be contributing to episodes of atrial fibrillation. Thus, I advised her to follow-up with Dr. Danise Mina about this.

## 2014-11-07 NOTE — Assessment & Plan Note (Signed)
Blood pressure is well controlled on current medications. 

## 2014-11-07 NOTE — Progress Notes (Signed)
Primary care physician: Dr. Danise Mina  HPI  This is a 74 year old female who is here today for a follow up visit regarding atrial fibrillation.  She has known history of persistent atrial fibrillation diagnosed in 2010. She had cardioversion done once at that time. It seems that she had recurrent atrial fibrillation in 2011 and was placed on flecainide by Dr. Gillian Shields her previous cardiologist.  She reports having a cardiac catheterization many years ago by Dr. Clayborn Bigness which showed no significant coronary artery disease.  Echocardiogram in April of 2014 showed normal LV systolic function, mild left ventricular hypertrophy and only mild mitral regurgitation.  She was hospitalized in January, 2015 with diverticulitis. She developed rectus sheath hematoma from Lovenox injection which was given as bridging. She had multiple complications related to that and required nephrostomy tubes.  She was seen recently for atrial fibrillation. She was very symptomatic and thus we proceeded with cardioversion which was done without complications. She feels back to her baseline since then. She reports history of sleep apnea or unspecified sleep disorder but currently she does not use a CPAP machine.   Allergies  Allergen Reactions  . Paxil [Paroxetine Hcl] Other (See Comments)    Shaking   . Clindamycin/Lincomycin Other (See Comments)    unknown  . Codeine Other (See Comments)    unknown  . Doxycycline Other (See Comments)    unknown  . Promethazine Other (See Comments)    unknown  . Sulfa Antibiotics Hives  . Valium Other (See Comments)    unknown     Current Outpatient Prescriptions on File Prior to Visit  Medication Sig Dispense Refill  . acetaminophen (TYLENOL) 500 MG tablet Take 1,000 mg by mouth every 6 (six) hours as needed for moderate pain.    Marland Kitchen amLODipine (NORVASC) 10 MG tablet Take 1 tablet (10 mg total) by mouth daily. 90 tablet 1  . clobetasol cream (TEMOVATE) 5.78 % Apply 1 application  topically at bedtime as needed. Apply to AA nightly for no more than 1 wk at a time 45 g 0  . clonazePAM (KLONOPIN) 0.5 MG tablet TAKE 1 TABLET BY MOUTH EVERY NIGHT AT BEDTIME 30 tablet 0  . flecainide (TAMBOCOR) 100 MG tablet Take 1 tablet (100 mg total) by mouth 2 (two) times daily. 180 tablet 1  . fluticasone (FLONASE) 50 MCG/ACT nasal spray Place 2 sprays into both nostrils daily. 16 g 3  . furosemide (LASIX) 40 MG tablet Take 1 tablet (40 mg total) by mouth 2 (two) times daily. (Patient taking differently: Take 40 mg by mouth daily. ) 60 tablet 0  . lansoprazole (PREVACID) 15 MG capsule Take 1 capsule (15 mg total) by mouth daily. 90 capsule 1  . levothyroxine (SYNTHROID, LEVOTHROID) 25 MCG tablet Take 1 tablet (25 mcg total) by mouth daily. 90 tablet 1  . losartan (COZAAR) 50 MG tablet Take 1 tablet (50 mg total) by mouth daily. 90 tablet 1  . metoprolol (LOPRESSOR) 50 MG tablet Take 1 tablet (50 mg total) by mouth 2 (two) times daily. 180 tablet 1  . simvastatin (ZOCOR) 20 MG tablet Take 0.5 tablets (10 mg total) by mouth every evening. 45 tablet 1  . triamcinolone cream (KENALOG) 0.1 % APPLY TO AFFECTED AREA TOPICALLY TWICE DAILY (Patient taking differently: APPLY TO AFFECTED AREA TOPICALLY AS NEEDED) 120 g 0  . warfarin (COUMADIN) 5 MG tablet Take 1 tablet (5 mg total) by mouth as directed. (Patient taking differently: Take 5 mg by mouth as directed.  5 mg daily and then one-half on Friday) 90 tablet 1   No current facility-administered medications on file prior to visit.     Past Medical History  Diagnosis Date  . Hyperlipidemia   . Hypertension   . Hypothyroidism     borderline  . Osteoarthritis of knee     s/p replacement  . Abnormal mammogram     left medial breast - benign fat necrosis with residual calcification  . Benign head tremor   . Atrial fibrillation 2010    s/p DC cardioversion. on coumadin  . Insomnia   . GERD (gastroesophageal reflux disease)   . Chronic  bronchitis     "used to get it alot; not so much anymore" (08/27/2013)  . Sleep apnea     "does not wear mask" (08/27/2013)  . Rectus sheath hematoma 08/27/2013    R after bridging with lovenox  . H/O hiatal hernia     moderate sized/notes  . History of diverticulitis of colon 08/2013    sigmoid with possible microperf/abscess s/p hospitalization complicated by rectus sheath hematoma during lovenox bridge, leading to ARF from hematoma compression on kidney     Past Surgical History  Procedure Laterality Date  . Replacement total knee Bilateral 2005  . Appendectomy  1947  . Dilation and curettage of uterus    . Cholecystectomy  1980  . Cardiac catheterization      [facility];Dr. Clayborn Bigness  . Dexa  10/2012    WNL  . Tonsillectomy    . Joint replacement    . Abdominal exploration surgery  1971    "to see if I needed hysterectomy; dr thought qthing looked too good so he didn't do a hysterectomy at this time" (08/27/2013)  . Vaginal hysterectomy  1971    "2 wks after 1st dr said it was ok" (08/27/2013)  . Cardioversion      [facility] Endo 08/27/2013  . Percutaneous nephrostomy  08/2013    ARF from hematoma compression     Family History  Problem Relation Age of Onset  . CAD Mother     angina  . Cancer Paternal Grandfather     ?  Marland Kitchen Alzheimer's disease Father     died from PNA  . Diabetes Mother   . Stroke Mother 14     History   Social History  . Marital Status: Widowed    Spouse Name: N/A  . Number of Children: N/A  . Years of Education: N/A   Occupational History  . Retired    Social History Main Topics  . Smoking status: Never Smoker   . Smokeless tobacco: Never Used  . Alcohol Use: No  . Drug Use: No  . Sexual Activity: No   Other Topics Concern  . Not on file   Social History Narrative   Caffeine: 1 cup coffee/day   Lives alone in senior living.  Daughter in [place]   Occupation: retired, Scientist, research (medical)   Edu: HS   Activity: walks the track   Diet:      PHYSICAL  EXAM   BP 134/80 mmHg  Pulse 58  Ht 5\' 7"  (1.702 m)  Wt 282 lb 12 oz (128.255 kg)  BMI 44.27 kg/m2 Constitutional: She is oriented to person, place, and time. She appears well-developed and well-nourished. No distress.  HENT: No nasal discharge.  Head: Normocephalic and atraumatic.  Eyes: Pupils are equal and round. Right eye exhibits no discharge. Left eye exhibits no discharge.  Neck: Normal range of motion. Neck  supple. No JVD present. No thyromegaly present.  Cardiovascular: Normal rate, regular rhythm, normal heart sounds. Exam reveals no gallop and no friction rub. No murmur heard.  Pulmonary/Chest: Effort normal and breath sounds normal. No stridor. No respiratory distress. She has no wheezes. She has no rales. She exhibits no tenderness.  Abdominal: Soft. Bowel sounds are normal. She exhibits no distension. There is no tenderness. There is no rebound and no guarding.  Musculoskeletal: Normal range of motion. She exhibits no edema and no tenderness.  Neurological: She is alert and oriented to person, place, and time. Coordination normal.  Skin: Skin is warm and dry. No rash noted. She is not diaphoretic. No erythema. No pallor.  Psychiatric: She has a normal mood and affect. Her behavior is normal. Judgment and thought content normal.     EKG: Sinus  Bradycardia  -Nonspecific QRS widening.   BORDERLINE    ASSESSMENT AND PLAN

## 2014-11-08 ENCOUNTER — Ambulatory Visit: Payer: Commercial Managed Care - HMO | Admitting: Cardiovascular Disease

## 2014-11-24 ENCOUNTER — Other Ambulatory Visit (INDEPENDENT_AMBULATORY_CARE_PROVIDER_SITE_OTHER): Payer: Commercial Managed Care - HMO

## 2014-11-24 DIAGNOSIS — Z5181 Encounter for therapeutic drug level monitoring: Secondary | ICD-10-CM

## 2014-11-24 DIAGNOSIS — Z7901 Long term (current) use of anticoagulants: Secondary | ICD-10-CM | POA: Diagnosis not present

## 2014-11-24 DIAGNOSIS — I4891 Unspecified atrial fibrillation: Secondary | ICD-10-CM | POA: Diagnosis not present

## 2014-11-24 LAB — POCT INR: INR: 2.2

## 2014-12-12 ENCOUNTER — Other Ambulatory Visit: Payer: Self-pay | Admitting: Family Medicine

## 2014-12-12 NOTE — Telephone Encounter (Signed)
Ok to refill 

## 2014-12-12 NOTE — Telephone Encounter (Signed)
Rx called in as directed.   

## 2014-12-12 NOTE — Telephone Encounter (Signed)
plz phone in. 

## 2014-12-22 ENCOUNTER — Other Ambulatory Visit (INDEPENDENT_AMBULATORY_CARE_PROVIDER_SITE_OTHER): Payer: Commercial Managed Care - HMO

## 2014-12-22 DIAGNOSIS — Z7901 Long term (current) use of anticoagulants: Secondary | ICD-10-CM | POA: Diagnosis not present

## 2014-12-22 DIAGNOSIS — I482 Chronic atrial fibrillation, unspecified: Secondary | ICD-10-CM

## 2014-12-22 DIAGNOSIS — I4891 Unspecified atrial fibrillation: Secondary | ICD-10-CM

## 2014-12-22 DIAGNOSIS — Z5181 Encounter for therapeutic drug level monitoring: Secondary | ICD-10-CM

## 2014-12-22 LAB — POCT INR: INR: 2.4

## 2015-01-06 ENCOUNTER — Telehealth: Payer: Self-pay

## 2015-01-06 NOTE — Telephone Encounter (Signed)
Pt said she missed call on 01/05/15; pt cannot remember name of person who left v/m to call office. Have asked Kim,Tasha and Terri. May have been call to remind pt of 01/10/15 lab appt. Pt will keep appt and wait for another cb if needed.

## 2015-01-10 ENCOUNTER — Other Ambulatory Visit (INDEPENDENT_AMBULATORY_CARE_PROVIDER_SITE_OTHER): Payer: Commercial Managed Care - HMO

## 2015-01-10 DIAGNOSIS — Z5181 Encounter for therapeutic drug level monitoring: Secondary | ICD-10-CM

## 2015-01-10 DIAGNOSIS — I4891 Unspecified atrial fibrillation: Secondary | ICD-10-CM

## 2015-01-10 DIAGNOSIS — Z7901 Long term (current) use of anticoagulants: Secondary | ICD-10-CM | POA: Diagnosis not present

## 2015-01-10 LAB — POCT INR: INR: 2.3

## 2015-01-11 ENCOUNTER — Other Ambulatory Visit: Payer: Self-pay | Admitting: Family Medicine

## 2015-02-07 ENCOUNTER — Other Ambulatory Visit (INDEPENDENT_AMBULATORY_CARE_PROVIDER_SITE_OTHER): Payer: PPO

## 2015-02-07 DIAGNOSIS — I4891 Unspecified atrial fibrillation: Secondary | ICD-10-CM

## 2015-02-07 DIAGNOSIS — Z7901 Long term (current) use of anticoagulants: Secondary | ICD-10-CM

## 2015-02-07 DIAGNOSIS — Z5181 Encounter for therapeutic drug level monitoring: Secondary | ICD-10-CM | POA: Diagnosis not present

## 2015-02-07 LAB — POCT INR: INR: 2.3

## 2015-02-10 ENCOUNTER — Other Ambulatory Visit: Payer: Self-pay | Admitting: Family Medicine

## 2015-02-10 NOTE — Telephone Encounter (Signed)
plz phone in. 

## 2015-02-10 NOTE — Telephone Encounter (Signed)
Rx called in as directed.   

## 2015-02-10 NOTE — Telephone Encounter (Signed)
Ok to refill 

## 2015-03-16 ENCOUNTER — Ambulatory Visit (INDEPENDENT_AMBULATORY_CARE_PROVIDER_SITE_OTHER): Payer: PPO | Admitting: *Deleted

## 2015-03-16 DIAGNOSIS — I4891 Unspecified atrial fibrillation: Secondary | ICD-10-CM

## 2015-03-16 DIAGNOSIS — Z5181 Encounter for therapeutic drug level monitoring: Secondary | ICD-10-CM | POA: Diagnosis not present

## 2015-03-16 LAB — POCT INR: INR: 2.3

## 2015-03-16 NOTE — Progress Notes (Signed)
Pre visit review using our clinic review tool, if applicable. No additional management support is needed unless otherwise documented below in the visit note. 

## 2015-03-30 ENCOUNTER — Telehealth: Payer: Self-pay | Admitting: Family Medicine

## 2015-03-30 ENCOUNTER — Emergency Department (HOSPITAL_BASED_OUTPATIENT_CLINIC_OR_DEPARTMENT_OTHER)
Admission: EM | Admit: 2015-03-30 | Discharge: 2015-03-30 | Disposition: A | Discharge status: Home / Self Care | Payer: PPO | Attending: Emergency Medicine | Admitting: Emergency Medicine

## 2015-03-30 ENCOUNTER — Encounter (HOSPITAL_BASED_OUTPATIENT_CLINIC_OR_DEPARTMENT_OTHER): Payer: Self-pay | Admitting: *Deleted

## 2015-03-30 DIAGNOSIS — E785 Hyperlipidemia, unspecified: Secondary | ICD-10-CM | POA: Diagnosis not present

## 2015-03-30 DIAGNOSIS — G4700 Insomnia, unspecified: Secondary | ICD-10-CM | POA: Diagnosis not present

## 2015-03-30 DIAGNOSIS — Z7901 Long term (current) use of anticoagulants: Secondary | ICD-10-CM | POA: Insufficient documentation

## 2015-03-30 DIAGNOSIS — N6459 Other signs and symptoms in breast: Secondary | ICD-10-CM

## 2015-03-30 DIAGNOSIS — I4891 Unspecified atrial fibrillation: Secondary | ICD-10-CM | POA: Diagnosis not present

## 2015-03-30 DIAGNOSIS — Z8709 Personal history of other diseases of the respiratory system: Secondary | ICD-10-CM | POA: Insufficient documentation

## 2015-03-30 DIAGNOSIS — Z96659 Presence of unspecified artificial knee joint: Secondary | ICD-10-CM | POA: Insufficient documentation

## 2015-03-30 DIAGNOSIS — Z7952 Long term (current) use of systemic steroids: Secondary | ICD-10-CM | POA: Insufficient documentation

## 2015-03-30 DIAGNOSIS — N6452 Nipple discharge: Secondary | ICD-10-CM | POA: Diagnosis not present

## 2015-03-30 DIAGNOSIS — K219 Gastro-esophageal reflux disease without esophagitis: Secondary | ICD-10-CM | POA: Insufficient documentation

## 2015-03-30 DIAGNOSIS — Z79899 Other long term (current) drug therapy: Secondary | ICD-10-CM | POA: Diagnosis not present

## 2015-03-30 DIAGNOSIS — Z9889 Other specified postprocedural states: Secondary | ICD-10-CM | POA: Insufficient documentation

## 2015-03-30 DIAGNOSIS — Z7951 Long term (current) use of inhaled steroids: Secondary | ICD-10-CM | POA: Diagnosis not present

## 2015-03-30 DIAGNOSIS — I10 Essential (primary) hypertension: Secondary | ICD-10-CM | POA: Insufficient documentation

## 2015-03-30 LAB — PROTIME-INR
INR: 1.95 — AB (ref 0.00–1.49)
Prothrombin Time: 22.1 seconds — ABNORMAL HIGH (ref 11.6–15.2)

## 2015-03-30 LAB — CBC
HEMATOCRIT: 44.1 % (ref 36.0–46.0)
Hemoglobin: 14.3 g/dL (ref 12.0–15.0)
MCH: 28.1 pg (ref 26.0–34.0)
MCHC: 32.4 g/dL (ref 30.0–36.0)
MCV: 86.6 fL (ref 78.0–100.0)
PLATELETS: 239 10*3/uL (ref 150–400)
RBC: 5.09 MIL/uL (ref 3.87–5.11)
RDW: 14.9 % (ref 11.5–15.5)
WBC: 6 10*3/uL (ref 4.0–10.5)

## 2015-03-30 LAB — BASIC METABOLIC PANEL
Anion gap: 8 (ref 5–15)
BUN: 14 mg/dL (ref 6–20)
CO2: 28 mmol/L (ref 22–32)
CREATININE: 0.86 mg/dL (ref 0.44–1.00)
Calcium: 8.8 mg/dL — ABNORMAL LOW (ref 8.9–10.3)
Chloride: 106 mmol/L (ref 101–111)
GFR calc Af Amer: >60 mL/min (ref >60)
GFR calc non Af Amer: >60 mL/min (ref >60)
Glucose, Bld: 109 mg/dL — ABNORMAL HIGH (ref 65–99)
Potassium: 4.3 mmol/L (ref 3.5–5.1)
Sodium: 142 mmol/L (ref 135–145)

## 2015-03-30 NOTE — Telephone Encounter (Signed)
Left detailed message on voicemail.  

## 2015-03-30 NOTE — ED Provider Notes (Signed)
CSN: [id number]     Arrival date & time 03/30/15  0945 History   First MD Initiated Contact with Patient 03/30/15 1008     Chief Complaint  Patient presents with  . Breast Problem     (Consider location/radiation/quality/duration/timing/severity/associated sxs/prior Treatment) HPI  Pt presenting with c/o bleeding from right nipple.  She states bleeding began last week- symptoms have been intermittent.  This morning she noted more bleeding while in the shower.  No breast pain.  Has not noted any lumps in breast.  No fever/chills.  No redness overlying breast.  She takes coumadin for hx of afib.  Has not noted any other areas of easy bleeding or bruising.  There are no other associated systemic symptoms, there are no other alleviating or modifying factors.   Past Medical History  Diagnosis Date  . Hyperlipidemia   . Hypertension   . Hypothyroidism     borderline  . Osteoarthritis of knee     s/p replacement  . Abnormal mammogram     left medial breast - benign fat necrosis with residual calcification  . Benign head tremor   . Atrial fibrillation 2010    s/p DC cardioversion. on coumadin  . Insomnia   . GERD (gastroesophageal reflux disease)   . Chronic bronchitis     "used to get it alot; not so much anymore" (08/27/2013)  . Sleep apnea     "does not wear mask" (08/27/2013)  . Rectus sheath hematoma 08/27/2013    R after bridging with lovenox  . H/O hiatal hernia     moderate sized/notes  . History of diverticulitis of colon 08/2013    sigmoid with possible microperf/abscess s/p hospitalization complicated by rectus sheath hematoma during lovenox bridge, leading to ARF from hematoma compression on kidney   Past Surgical History  Procedure Laterality Date  . Replacement total knee Bilateral 2005  . Appendectomy  1947  . Dilation and curettage of uterus    . Cholecystectomy  1980  . Cardiac catheterization      [facility];Dr. Clayborn Bigness  . Dexa  10/2012    WNL  . Tonsillectomy     . Joint replacement    . Abdominal exploration surgery  1971    "to see if I needed hysterectomy; dr thought qthing looked too good so he didn't do a hysterectomy at this time" (08/27/2013)  . Vaginal hysterectomy  1971    "2 wks after 1st dr said it was ok" (08/27/2013)  . Cardioversion      [facility] Endo 08/27/2013  . Percutaneous nephrostomy  08/2013    ARF from hematoma compression   Family History  Problem Relation Age of Onset  . CAD Mother     angina  . Cancer Paternal Grandfather     ?  Marland Kitchen Alzheimer's disease Father     died from PNA  . Diabetes Mother   . Stroke Mother 47   History  Substance Use Topics  . Smoking status: Never Smoker   . Smokeless tobacco: Never Used  . Alcohol Use: No   OB History    No data available     Review of Systems  ROS reviewed and all otherwise negative except for mentioned in HPI    Allergies  Paxil; Clindamycin/lincomycin; Codeine; Doxycycline; Promethazine; Sulfa antibiotics; and Valium  Home Medications   Prior to Admission medications   Medication Sig Start Date End Date Taking? Authorizing Provider  acetaminophen (TYLENOL) 500 MG tablet Take 1,000 mg by mouth every 6 (six)  hours as needed for moderate pain.    Historical Provider, MD  amLODipine (NORVASC) 10 MG tablet Take 1 tablet (10 mg total) by mouth daily. 10/28/14   Ria Bush, MD  clonazePAM (KLONOPIN) 0.5 MG tablet TAKE 1 TABLET BY MOUTH AT BEDTIME 02/10/15   Ria Bush, MD  flecainide (TAMBOCOR) 100 MG tablet Take 1 tablet (100 mg total) by mouth 2 (two) times daily. 12/13/13   Ria Bush, MD  fluticasone [facility]) 50 MCG/ACT nasal spray USE 2 SPARYS IN [facility] NOSTRIL DAILY 01/12/15   Ria Bush, MD  lansoprazole (PREVACID) 15 MG capsule Take 1 capsule (15 mg total) by mouth daily. 12/13/13   Ria Bush, MD  levothyroxine (SYNTHROID, LEVOTHROID) 25 MCG tablet Take 1 tablet (25 mcg total) by mouth daily. 10/28/14   Ria Bush, MD  losartan  (COZAAR) 50 MG tablet Take 1 tablet (50 mg total) by mouth daily. 10/28/14   Ria Bush, MD  metoprolol (LOPRESSOR) 50 MG tablet Take 1 tablet (50 mg total) by mouth 2 (two) times daily. 10/28/14   Ria Bush, MD  simvastatin (ZOCOR) 20 MG tablet Take 0.5 tablets (10 mg total) by mouth every evening. 10/28/14   Ria Bush, MD  triamcinolone cream (KENALOG) 0.1 % APPLY TO AFFECTED AREA TOPICALLY TWICE DAILY Patient taking differently: APPLY TO AFFECTED AREA TOPICALLY AS NEEDED    Ria Bush, MD  warfarin (COUMADIN) 5 MG tablet Take 1 tablet (5 mg total) by mouth as directed. Patient taking differently: Take 5 mg by mouth as directed. 5 mg daily and then one-half on Friday 12/13/13   Ria Bush, MD   BP 133/73 mmHg  Pulse 54  Temp(Src) 98.2 F (36.8 C) (Oral)  Resp 20  Ht 5' 7.5" (1.715 m)  Wt 265 lb (120.203 kg)  BMI 40.87 kg/m2  SpO2 99%  Vitals reviewed Physical Exam  Physical Examination: General appearance - alert, well appearing, and in no distress Mental status - alert, oriented to person, place, and time Eyes - no conjunctiva injection, no scleral icterus Mouth - mucous membranes moist, pharynx normal without lesions Chest - clear to auscultation, no wheezes, rales or rhonchi, symmetric air entry Heart - normal rate, regular rhythm, normal S1, S2, no murmurs, rubs, clicks or gallops Breasts -right nipple with bleeding, controlled by pressure, small approx 1cm nodule lying underneat right nipply, no skin changes, no palpable axillary nodes Neurological - alert, oriented, normal speech, Extremities - peripheral pulses normal, no pedal edema, no clubbing or cyanosis Skin - normal coloration and turgor, no rashes  ED Course  Procedures (including critical care time) Labs Review Labs Reviewed  BASIC METABOLIC PANEL - Abnormal; Notable for the following:    Glucose, Bld 109 (*)    Calcium 8.8 (*)    All other components within normal limits  PROTIME-INR  - Abnormal; Notable for the following:    Prothrombin Time 22.1 (*)    INR 1.95 (*)    All other components within normal limits  CBC    Imaging Review No results found.   EKG Interpretation None      MDM   Final diagnoses:  Bleeding from nipple in female    Pt presenting with bleeding from right nipple, she is on coumadin for afib- INR 1.95- bleeding is controlled.    Prior records reviewed and considered during this visit 11:24 AM I have talked with breast center and arranged for patient to have mammogram, ductogram and breast ultrasound.  The earliest they can perform these  is August 2nd, Tuesday- pt has 9:20am appointment, the studies have been ordered at breast center request.  Pt will need to obtain her mammogram records from prior mamograms before the appointment.  If not able to obtain the appt will need to be rescheduled.    Alfonzo Beers, MD 04/01/15 [phone]

## 2015-03-30 NOTE — Discharge Instructions (Signed)
Return to the ED with any concerns including increased pain, bleeding that is not controlled by pressure, or any other alarming symptoms  An appointment has been scheduled for you at the [facility] for August 2nd at 9:20am.  You should bring copies of images of your prior mammogram with you to this appointment

## 2015-03-30 NOTE — ED Notes (Signed)
Pt amb to room 6 with slow, steady gait in nad. Pt reports bright red blood from her right nipple off and on x last week. Denies any pain or other c/o, right nipple has dried blood noted, no active bleeding, pt states "when I was in the shower it was pouring out..." nipple non-tender to palp.

## 2015-03-30 NOTE — Telephone Encounter (Signed)
[facility] Patient Name: Mckenzie Frank Gender: Female DOB: 1940-11-16 Age: 74 Y 11 M 19 D Return Phone Number: [phone] (Primary) Address: City/State/Zip: [place] Client [place] [facility] - Client Client Site [place] - Day Physician Ria Bush Contact Type Call Call Type Triage / Clinical Relationship To Patient Self Appointment Disposition EMR Appointment Not Necessary Info pasted into Epic Yes Return Phone Number [phone] (Primary) Chief Complaint BLEEDING - Uncontrollable (not vaginal) Initial Comment Caller states, uses blood thinners, today she is bleeding from her nipple, non stop, 10 min PreDisposition Call Doctor Nurse Assessment Nurse: Mechele Dawley, RN, Amy Date/Time Eilene Ghazi Time): 03/30/2015 9:12:44 AM Confirm and document reason for call. If symptomatic, describe symptoms. ---CALLER STATES THAT LAST WEEK - SHE WOKE UP AND BLOOD WAS ON HER GOWN. SHE GOT UP TO TAKE HER SHOWER AND THERE IS BLOOD ALL OVER HER GOWN. SHE STATES THAT SHE WAS DRIPPING FROM HER LEFT NIPPLE. SHE PUT A WET CLOTH AGAINST THE NIPPLE AND SHE GOT IN THE SHOWER AND GOT OUT. SHE FOLDED UP SOME TOILET PAPER AND SHE IS ON COUMADIN. AT THIS MOMENT IT HAS NOT SOAKED THROUGH. Has the patient traveled out of the country within the last 30 days? ---Not Applicable Does the patient require triage? ---Yes Related visit to physician within the last 2 weeks? ---No Does the PT have any chronic conditions? (i.e. diabetes, asthma, etc.) ---Yes List chronic conditions. ---AFIB, Guidelines Guideline Title Affirmed Question Affirmed Notes Nurse Date/Time (Eastern Time) Scrapes [1] Bleeding AND [2] won't stop after 10 minutes of direct pressure (using correct technique) Mechele Dawley, RN, Amy 03/30/2015 9:16:34 AM Disp. Time Eilene Ghazi Time) Disposition Final  User 03/30/2015 9:08:39 AM Send to Urgent Neta Ehlers PLEASE NOTE: All timestamps contained within this report are represented as [place] Standard Time. CONFIDENTIALTY NOTICE: This fax transmission is intended only for the addressee. It contains information that is legally privileged, confidential or otherwise protected from use or disclosure. If you are not the intended recipient, you are strictly prohibited from reviewing, disclosing, copying using or disseminating any of this information or taking any action in reliance on or regarding this information. If you have received this fax in error, please notify us immediately by telephone so that we can arrange for its return to [place]. Phone: [phone], Toll-Free: [phone], Fax: [phone] Page: 2 of 2 Call Id: [id number] 03/30/2015 9:16:58 AM Go to ED Now Yes Mechele Dawley, RN, Amy Caller Understands: Yes Disagree/Comply: Comply Care Advice Given Per Guideline GO TO ED NOW: You need to be seen in the Emergency Department. Go to the ER at ___________ [place] now. Drive carefully. BLEEDING: Continue direct pressure with a sterile gauze or clean cloth until seen. CARE ADVICE given per Scrapes (Adult) guideline. After Care Instructions Given Call Event Type User Date / Time Description Referrals GO TO FACILITY UNDECIDED

## 2015-03-30 NOTE — Telephone Encounter (Signed)
If she can control the bleeding, then can schedule for OV here.  If she can't control bleeding, is lightheaded, etc, then to ER.

## 2015-03-31 ENCOUNTER — Other Ambulatory Visit: Payer: Self-pay | Admitting: Emergency Medicine

## 2015-03-31 DIAGNOSIS — N6459 Other signs and symptoms in breast: Secondary | ICD-10-CM

## 2015-04-04 ENCOUNTER — Other Ambulatory Visit: Payer: PPO

## 2015-04-04 ENCOUNTER — Ambulatory Visit
Admission: RE | Admit: 2015-04-04 | Discharge: 2015-04-04 | Disposition: A | Discharge status: Home / Self Care | Payer: PPO | Source: Ambulatory Visit | Attending: Emergency Medicine | Admitting: Emergency Medicine

## 2015-04-04 ENCOUNTER — Other Ambulatory Visit: Payer: Self-pay | Admitting: Family Medicine

## 2015-04-04 DIAGNOSIS — Z1231 Encounter for screening mammogram for malignant neoplasm of breast: Secondary | ICD-10-CM

## 2015-04-04 DIAGNOSIS — N6459 Other signs and symptoms in breast: Secondary | ICD-10-CM

## 2015-04-05 ENCOUNTER — Telehealth: Payer: Self-pay | Admitting: *Deleted

## 2015-04-05 NOTE — Telephone Encounter (Signed)
Jeani Hawking, RN with [facility] called and said that patient is scheduled for diagnostic US and possible Bx on 04/11/15. She needs an order for her to stop her coumadin. If this is okay, she needs to know what she she can stop and restart. Call back is [phone]

## 2015-04-06 NOTE — Telephone Encounter (Signed)
rec stop warfarin 4d prior to procedure (Saturday), start back up on day of procedure. H/o bleed/hematoma with bridging. rec against bridging. plz notify pt and Jeani Hawking.

## 2015-04-06 NOTE — Telephone Encounter (Signed)
Pt called to inquire about stopping her coumadin. Her biopsy is scheduled for Tuesday afternoon and pt is wondering how many days to be off coumadin. Pt requests call back.

## 2015-04-07 NOTE — Telephone Encounter (Signed)
Patient notified and message left for Jeani Hawking to return my call.

## 2015-04-07 NOTE — Telephone Encounter (Signed)
Leigh from [facility] returned call and notified of instructions.

## 2015-04-10 ENCOUNTER — Other Ambulatory Visit: Payer: Self-pay | Admitting: Family Medicine

## 2015-04-10 NOTE — Telephone Encounter (Signed)
Rx called in as prescribed 

## 2015-04-10 NOTE — Telephone Encounter (Signed)
Will refill times one in pcp absence  Px written for call in   

## 2015-04-10 NOTE — Telephone Encounter (Signed)
Ok to refill in Dr. Synthia Innocent absence? Last filled 02/10/15 #30 1RF

## 2015-04-11 ENCOUNTER — Other Ambulatory Visit: Payer: Self-pay | Admitting: Emergency Medicine

## 2015-04-11 ENCOUNTER — Other Ambulatory Visit: Payer: Self-pay | Admitting: Family Medicine

## 2015-04-11 ENCOUNTER — Ambulatory Visit
Admission: RE | Admit: 2015-04-11 | Discharge: 2015-04-11 | Disposition: A | Discharge status: Home / Self Care | Payer: PPO | Source: Ambulatory Visit | Attending: Family Medicine | Admitting: Family Medicine

## 2015-04-11 DIAGNOSIS — Z1231 Encounter for screening mammogram for malignant neoplasm of breast: Secondary | ICD-10-CM

## 2015-04-11 DIAGNOSIS — N6452 Nipple discharge: Secondary | ICD-10-CM

## 2015-04-11 DIAGNOSIS — N6310 Unspecified lump in the right breast, unspecified quadrant: Secondary | ICD-10-CM

## 2015-04-15 ENCOUNTER — Other Ambulatory Visit: Payer: Self-pay | Admitting: Family Medicine

## 2015-04-15 DIAGNOSIS — I10 Essential (primary) hypertension: Secondary | ICD-10-CM

## 2015-04-15 DIAGNOSIS — K219 Gastro-esophageal reflux disease without esophagitis: Secondary | ICD-10-CM

## 2015-04-15 DIAGNOSIS — E039 Hypothyroidism, unspecified: Secondary | ICD-10-CM

## 2015-04-15 DIAGNOSIS — E785 Hyperlipidemia, unspecified: Secondary | ICD-10-CM

## 2015-04-17 ENCOUNTER — Ambulatory Visit (INDEPENDENT_AMBULATORY_CARE_PROVIDER_SITE_OTHER): Payer: PPO | Admitting: *Deleted

## 2015-04-17 ENCOUNTER — Other Ambulatory Visit (INDEPENDENT_AMBULATORY_CARE_PROVIDER_SITE_OTHER): Payer: PPO

## 2015-04-17 DIAGNOSIS — E785 Hyperlipidemia, unspecified: Secondary | ICD-10-CM

## 2015-04-17 DIAGNOSIS — I10 Essential (primary) hypertension: Secondary | ICD-10-CM

## 2015-04-17 DIAGNOSIS — Z5181 Encounter for therapeutic drug level monitoring: Secondary | ICD-10-CM

## 2015-04-17 DIAGNOSIS — K219 Gastro-esophageal reflux disease without esophagitis: Secondary | ICD-10-CM | POA: Diagnosis not present

## 2015-04-17 DIAGNOSIS — E039 Hypothyroidism, unspecified: Secondary | ICD-10-CM | POA: Diagnosis not present

## 2015-04-17 DIAGNOSIS — I4891 Unspecified atrial fibrillation: Secondary | ICD-10-CM

## 2015-04-17 LAB — COMPREHENSIVE METABOLIC PANEL
ALT: 21 U/L (ref 0–35)
AST: 18 U/L (ref 0–37)
Albumin: 4.3 g/dL (ref 3.5–5.2)
Alkaline Phosphatase: 112 U/L (ref 39–117)
BUN: 17 mg/dL (ref 6–23)
CHLORIDE: 103 meq/L (ref 96–112)
CO2: 31 mEq/L (ref 19–32)
Calcium: 9.1 mg/dL (ref 8.4–10.5)
Creatinine, Ser: 0.81 mg/dL (ref 0.40–1.20)
GFR: 73.06 mL/min (ref >60.00)
GLUCOSE: 102 mg/dL — AB (ref 70–99)
POTASSIUM: 3.9 meq/L (ref 3.5–5.1)
SODIUM: 141 meq/L (ref 135–145)
Total Bilirubin: 1 mg/dL (ref 0.2–1.2)
Total Protein: 7.2 g/dL (ref 6.0–8.3)

## 2015-04-17 LAB — LIPID PANEL
Cholesterol: 182 mg/dL (ref 0–200)
HDL: 48.4 mg/dL (ref >39.00)
LDL CALC: 107 mg/dL — AB (ref 0–99)
NonHDL: 133.89
Total CHOL/HDL Ratio: 4
Triglycerides: 132 mg/dL (ref 0.0–149.0)
VLDL: 26.4 mg/dL (ref 0.0–40.0)

## 2015-04-17 LAB — TSH: TSH: 2.07 u[IU]/mL (ref 0.35–4.50)

## 2015-04-17 LAB — POCT INR: INR: 1.5

## 2015-04-17 LAB — VITAMIN D 25 HYDROXY (VIT D DEFICIENCY, FRACTURES): VITD: 15.38 ng/mL — ABNORMAL LOW (ref 30.00–100.00)

## 2015-04-17 NOTE — Progress Notes (Signed)
Pre visit review using our clinic review tool, if applicable. No additional management support is needed unless otherwise documented below in the visit note. 

## 2015-04-20 ENCOUNTER — Ambulatory Visit: Payer: PPO

## 2015-04-20 ENCOUNTER — Other Ambulatory Visit: Payer: PPO

## 2015-04-27 ENCOUNTER — Ambulatory Visit (INDEPENDENT_AMBULATORY_CARE_PROVIDER_SITE_OTHER): Payer: PPO | Admitting: Family Medicine

## 2015-04-27 ENCOUNTER — Ambulatory Visit (INDEPENDENT_AMBULATORY_CARE_PROVIDER_SITE_OTHER): Payer: PPO | Admitting: *Deleted

## 2015-04-27 ENCOUNTER — Encounter: Payer: Self-pay | Admitting: Family Medicine

## 2015-04-27 VITALS — BP 120/88 | HR 54 | Temp 98.1°F | Wt 264.0 lb

## 2015-04-27 DIAGNOSIS — I4891 Unspecified atrial fibrillation: Secondary | ICD-10-CM

## 2015-04-27 DIAGNOSIS — Z5181 Encounter for therapeutic drug level monitoring: Secondary | ICD-10-CM

## 2015-04-27 DIAGNOSIS — Z0000 Encounter for general adult medical examination without abnormal findings: Secondary | ICD-10-CM

## 2015-04-27 DIAGNOSIS — N6310 Unspecified lump in the right breast, unspecified quadrant: Secondary | ICD-10-CM | POA: Insufficient documentation

## 2015-04-27 DIAGNOSIS — Z23 Encounter for immunization: Secondary | ICD-10-CM

## 2015-04-27 DIAGNOSIS — E785 Hyperlipidemia, unspecified: Secondary | ICD-10-CM

## 2015-04-27 DIAGNOSIS — E039 Hypothyroidism, unspecified: Secondary | ICD-10-CM

## 2015-04-27 DIAGNOSIS — Z1211 Encounter for screening for malignant neoplasm of colon: Secondary | ICD-10-CM

## 2015-04-27 DIAGNOSIS — Z7189 Other specified counseling: Secondary | ICD-10-CM | POA: Insufficient documentation

## 2015-04-27 DIAGNOSIS — I10 Essential (primary) hypertension: Secondary | ICD-10-CM

## 2015-04-27 LAB — POCT INR: INR: 2.1

## 2015-04-27 NOTE — Assessment & Plan Note (Signed)
Chronic, stable. Continue simvastatin.  

## 2015-04-27 NOTE — Patient Instructions (Addendum)
prevnar today. Flu shot today. Pass by lab to pick up stool kit. Good to see you today, call us with questions. Return in 6 months for follow up. We will monitor along with breast center.  Health Maintenance Adopting a healthy lifestyle and getting preventive care can go a long way to promote health and wellness. Talk with your health care provider about what schedule of regular examinations is right for you. This is a good chance for you to check in with your provider about disease prevention and staying healthy. In between checkups, there are plenty of things you can do on your own. Experts have done a lot of research about which lifestyle changes and preventive measures are most likely to keep you healthy. Ask your health care provider for more information. WEIGHT AND DIET  Eat a healthy diet  Be sure to include plenty of vegetables, fruits, low-fat dairy products, and lean protein.  Do not eat a lot of foods high in solid fats, added sugars, or salt.  Get regular exercise. This is one of the most important things you can do for your health.  Most adults should exercise for at least 150 minutes each week. The exercise should increase your heart rate and make you sweat (moderate-intensity exercise).  Most adults should also do strengthening exercises at least twice a week. This is in addition to the moderate-intensity exercise.  Maintain a healthy weight  Body mass index (BMI) is a measurement that can be used to identify possible weight problems. It estimates body fat based on height and weight. Your health care provider can help determine your BMI and help you achieve or maintain a healthy weight.  For females 71 years of age and older:   A BMI below 18.5 is considered underweight.  A BMI of 18.5 to 24.9 is normal.  A BMI of 25 to 29.9 is considered overweight.  A BMI of 30 and above is considered obese.  Watch levels of cholesterol and blood lipids  You should start having  your blood tested for lipids and cholesterol at 76 years of age, then have this test every 5 years.  You may need to have your cholesterol levels checked more often if:  Your lipid or cholesterol levels are high.  You are older than 76 years of age.  You are at high risk for heart disease.  CANCER SCREENING   Lung Cancer  Lung cancer screening is recommended for adults 54-35 years old who are at high risk for lung cancer because of a history of smoking.  A yearly low-dose CT scan of the lungs is recommended for people who:  Currently smoke.  Have quit within the past 15 years.  Have at least a 30-pack-year history of smoking. A pack year is smoking an average of one pack of cigarettes a day for 1 year.  Yearly screening should continue until it has been 15 years since you quit.  Yearly screening should stop if you develop a health problem that would prevent you from having lung cancer treatment.  Breast Cancer  Practice breast self-awareness. This means understanding how your breasts normally appear and feel.  It also means doing regular breast self-exams. Let your health care provider know about any changes, no matter how small.  If you are in your 20s or 30s, you should have a clinical breast exam (CBE) by a health care provider every 1-3 years as part of a regular health exam.  If you are 40 or older,  have a CBE every year. Also consider having a breast X-ray (mammogram) every year.  If you have a family history of breast cancer, talk to your health care provider about genetic screening.  If you are at high risk for breast cancer, talk to your health care provider about having an MRI and a mammogram every year.  Breast cancer gene (BRCA) assessment is recommended for women who have family members with BRCA-related cancers. BRCA-related cancers include:  Breast.  Ovarian.  Tubal.  Peritoneal cancers.  Results of the assessment will determine the need for genetic  counseling and BRCA1 and BRCA2 testing. Cervical Cancer Routine pelvic examinations to screen for cervical cancer are no longer recommended for nonpregnant women who are considered low risk for cancer of the pelvic organs (ovaries, uterus, and vagina) and who do not have symptoms. A pelvic examination may be necessary if you have symptoms including those associated with pelvic infections. Ask your health care provider if a screening pelvic exam is right for you.   The Pap test is the screening test for cervical cancer for women who are considered at risk.  If you had a hysterectomy for a problem that was not cancer or a condition that could lead to cancer, then you no longer need Pap tests.  If you are older than 65 years, and you have had normal Pap tests for the past 10 years, you no longer need to have Pap tests.  If you have had past treatment for cervical cancer or a condition that could lead to cancer, you need Pap tests and screening for cancer for at least 20 years after your treatment.  If you no longer get a Pap test, assess your risk factors if they change (such as having a new sexual partner). This can affect whether you should start being screened again.  Some women have medical problems that increase their chance of getting cervical cancer. If this is the case for you, your health care provider may recommend more frequent screening and Pap tests.  The human papillomavirus (HPV) test is another test that may be used for cervical cancer screening. The HPV test looks for the virus that can cause cell changes in the cervix. The cells collected during the Pap test can be tested for HPV.  The HPV test can be used to screen women 57 years of age and older. Getting tested for HPV can extend the interval between normal Pap tests from three to five years.  An HPV test also should be used to screen women of any age who have unclear Pap test results.  After 76 years of age, women should have  HPV testing as often as Pap tests.  Colorectal Cancer  This type of cancer can be detected and often prevented.  Routine colorectal cancer screening usually begins at 75 years of age and continues through 76 years of age.  Your health care provider may recommend screening at an earlier age if you have risk factors for colon cancer.  Your health care provider may also recommend using home test kits to check for hidden blood in the stool.  A small camera at the end of a tube can be used to examine your colon directly (sigmoidoscopy or colonoscopy). This is done to check for the earliest forms of colorectal cancer.  Routine screening usually begins at age 106.  Direct examination of the colon should be repeated every 5-10 years through 76 years of age. However, you may need to be screened  more often if early forms of precancerous polyps or small growths are found. Skin Cancer  Check your skin from head to toe regularly.  Tell your health care provider about any new moles or changes in moles, especially if there is a change in a mole's shape or color.  Also tell your health care provider if you have a mole that is larger than the size of a pencil eraser.  Always use sunscreen. Apply sunscreen liberally and repeatedly throughout the day.  Protect yourself by wearing long sleeves, pants, a wide-brimmed hat, and sunglasses whenever you are outside. HEART DISEASE, DIABETES, AND HIGH BLOOD PRESSURE   Have your blood pressure checked at least every 1-2 years. High blood pressure causes heart disease and increases the risk of stroke.  If you are between 63 years and 65 years old, ask your health care provider if you should take aspirin to prevent strokes.  Have regular diabetes screenings. This involves taking a blood sample to check your fasting blood sugar level.  If you are at a normal weight and have a low risk for diabetes, have this test once every three years after 76 years of  age.  If you are overweight and have a high risk for diabetes, consider being tested at a younger age or more often. PREVENTING INFECTION  Hepatitis B  If you have a higher risk for hepatitis B, you should be screened for this virus. You are considered at high risk for hepatitis B if:  You were born in a country where hepatitis B is common. Ask your health care provider which countries are considered high risk.  Your parents were born in a high-risk country, and you have not been immunized against hepatitis B (hepatitis B vaccine).  You have HIV or AIDS.  You use needles to inject street drugs.  You live with someone who has hepatitis B.  You have had sex with someone who has hepatitis B.  You get hemodialysis treatment.  You take certain medicines for conditions, including cancer, organ transplantation, and autoimmune conditions. Hepatitis C  Blood testing is recommended for:  Everyone born from [birth year] through 1967.  Anyone with known risk factors for hepatitis C. Sexually transmitted infections (STIs)  You should be screened for sexually transmitted infections (STIs) including gonorrhea and chlamydia if:  You are sexually active and are younger than 76 years of age.  You are older than 76 years of age and your health care provider tells you that you are at risk for this type of infection.  Your sexual activity has changed since you were last screened and you are at an increased risk for chlamydia or gonorrhea. Ask your health care provider if you are at risk.  If you do not have HIV, but are at risk, it may be recommended that you take a prescription medicine daily to prevent HIV infection. This is called pre-exposure prophylaxis (PrEP). You are considered at risk if:  You are sexually active and do not regularly use condoms or know the HIV status of your partner(s).  You take drugs by injection.  You are sexually active with a partner who has HIV. Talk with your health  care provider about whether you are at high risk of being infected with HIV. If you choose to begin PrEP, you should first be tested for HIV. You should then be tested every 3 months for as long as you are taking PrEP.  PREGNANCY   If you are premenopausal and you may  become pregnant, ask your health care provider about preconception counseling.  If you may become pregnant, take 400 to 800 micrograms (mcg) of folic acid every day.  If you want to prevent pregnancy, talk to your health care provider about birth control (contraception). OSTEOPOROSIS AND MENOPAUSE   Osteoporosis is a disease in which the bones lose minerals and strength with aging. This can result in serious bone fractures. Your risk for osteoporosis can be identified using a bone density scan.  If you are 16 years of age or older, or if you are at risk for osteoporosis and fractures, ask your health care provider if you should be screened.  Ask your health care provider whether you should take a calcium or vitamin D supplement to lower your risk for osteoporosis.  Menopause may have certain physical symptoms and risks.  Hormone replacement therapy may reduce some of these symptoms and risks. Talk to your health care provider about whether hormone replacement therapy is right for you.  HOME CARE INSTRUCTIONS   Schedule regular health, dental, and eye exams.  Stay current with your immunizations.   Do not use any tobacco products including cigarettes, chewing tobacco, or electronic cigarettes.  If you are pregnant, do not drink alcohol.  If you are breastfeeding, limit how much and how often you drink alcohol.  Limit alcohol intake to no more than 1 drink per day for nonpregnant women. One drink equals 12 ounces of beer, 5 ounces of wine, or 1 ounces of hard liquor.  Do not use street drugs.  Do not share needles.  Ask your health care provider for help if you need support or information about quitting  drugs.  Tell your health care provider if you often feel depressed.  Tell your health care provider if you have ever been abused or do not feel safe at home. Document Released: 03/04/2011 Document Revised: 01/03/2014 Document Reviewed: 07/21/2013 [facility] Patient Information 2015 [employer] [place], [place]. This information is not intended to replace advice given to you by your health care provider. Make sure you discuss any questions you have with your health care provider.

## 2015-04-27 NOTE — Progress Notes (Signed)
Pre visit review using our clinic review tool, if applicable. No additional management support is needed unless otherwise documented below in the visit note. 

## 2015-04-27 NOTE — Assessment & Plan Note (Signed)
Chronic, stable. Continue antihypertensives.  

## 2015-04-27 NOTE — Progress Notes (Signed)
BP 120/88 mmHg  Pulse 54  Temp(Src) 98.1 F (36.7 C) (Oral)  Wt 264 lb (119.75 kg)  SpO2 98%   CC: medicare wellness visit  Subjective:    Patient ID: Mckenzie Frank, female    DOB: 01/26/1941, 74 y.o.   MRN: [id number]  HPI: Mckenzie Frank is a 74 y.o. female presenting on 04/27/2015 for Medicare Wellness   Recent R breast discharge s/p abnormal dx mammo/US (intraductal mass) - but US guided biopsy unrevealing (benign adipose tissue and blood). Pending f/u R ductogram next week, followed by the Breast center. Continued bleeding.   Hearing screen failed R high frequency and L both low and high frequency. Sometimes notices need to increase volume on TV. Declines audiology eval currently. Vision screen at eye center last year Denies depression/anhedonia. No falls in last year.   Preventative: Colonoscopy - 2005 normal per pt. iFOB today. Well woman with prior PCP, h/o partial hysterectomy (1971) due to heavy bleeding. Unsure if ovaries remain. Denies pelvic pain. No further paps.  Mammogram abnormal recently - see above. DEXA Date: 10/2012 WNL Flu shot - yearly today Tdap - 2010 Pneumovax - ~2012 per patient, prevnar today zostavax - 10/2010 Advanced directives - thinks would want son Mckenzie Frank to be HCPOA. Doesn't have set up. Packet provided today. Seat belt use discussed Sunscreen use discussed. No changing moles on skin.  Caffeine: 1 cup coffee/day  Lives alone in senior living. Daughter in [place]  Occupation: retired, Scientist, research (medical)  Edu: HS  Activity: walks the track and mall daily Diet: good water, fruits/vegetables daily   Relevant past medical, surgical, family and social history reviewed and updated as indicated. Interim medical history since our last visit reviewed. Allergies and medications reviewed and updated. Current Outpatient Prescriptions on File Prior to Visit  Medication Sig  . acetaminophen (TYLENOL) 500 MG tablet Take 1,000 mg by mouth every 6 (six) hours as  needed for moderate pain.  Marland Kitchen amLODipine (NORVASC) 10 MG tablet Take 1 tablet (10 mg total) by mouth daily.  . clonazePAM (KLONOPIN) 0.5 MG tablet TAKE 1 TABLET BY MOUTH AT BEDTIME  . flecainide (TAMBOCOR) 100 MG tablet Take 1 tablet (100 mg total) by mouth 2 (two) times daily.  . fluticasone (FLONASE) 50 MCG/ACT nasal spray USE 2 SPARYS IN EACH NOSTRIL DAILY  . lansoprazole (PREVACID) 15 MG capsule Take 1 capsule (15 mg total) by mouth daily.  Marland Kitchen levothyroxine (SYNTHROID, LEVOTHROID) 25 MCG tablet Take 1 tablet (25 mcg total) by mouth daily.  Marland Kitchen losartan (COZAAR) 50 MG tablet Take 1 tablet (50 mg total) by mouth daily.  . metoprolol (LOPRESSOR) 50 MG tablet Take 1 tablet (50 mg total) by mouth 2 (two) times daily.  . simvastatin (ZOCOR) 20 MG tablet Take 0.5 tablets (10 mg total) by mouth every evening.  . triamcinolone cream (KENALOG) 0.1 % APPLY TO AFFECTED AREA TOPICALLY TWICE DAILY (Patient taking differently: APPLY TO AFFECTED AREA TOPICALLY AS NEEDED)  . warfarin (COUMADIN) 5 MG tablet Take 1 tablet (5 mg total) by mouth as directed. (Patient taking differently: Take 5 mg by mouth as directed. 5 mg daily and then one-half on Friday)   No current facility-administered medications on file prior to visit.    Review of Systems Per HPI unless specifically indicated above     Objective:    BP 120/88 mmHg  Pulse 54  Temp(Src) 98.1 F (36.7 C) (Oral)  Wt 264 lb (119.75 kg)  SpO2 98%  Wt Readings from Last 3  Encounters:  04/27/15 264 lb (119.75 kg)  03/30/15 265 lb (120.203 kg)  11/07/14 282 lb 12 oz (128.255 kg)    Physical Exam  Constitutional: She is oriented to person, place, and time. She appears well-developed and well-nourished. No distress.  HENT:  Head: Normocephalic and atraumatic.  Right Ear: Hearing, tympanic membrane, external ear and ear canal normal.  Left Ear: Hearing, tympanic membrane, external ear and ear canal normal.  Nose: Nose normal.  Mouth/Throat: Uvula is  midline, oropharynx is clear and moist and mucous membranes are normal. No oropharyngeal exudate, posterior oropharyngeal edema or posterior oropharyngeal erythema.  Eyes: Conjunctivae and EOM are normal. Pupils are equal, round, and reactive to light. No scleral icterus.  Neck: Normal range of motion. Neck supple. Carotid bruit is not present. No thyromegaly present.  Cardiovascular: Normal rate, regular rhythm, normal heart sounds and intact distal pulses.   No murmur heard. Pulses:      Radial pulses are 2+ on the right side, and 2+ on the left side.  Pulmonary/Chest: Effort normal and breath sounds normal. No respiratory distress. She has no wheezes. She has no rales.  Breast exam - deferred - ongoing evaluation R breast  Abdominal: Soft. Bowel sounds are normal. She exhibits no distension and no mass. There is no tenderness. There is no rebound and no guarding.  Musculoskeletal: Normal range of motion. She exhibits no edema.  Lymphadenopathy:    She has no cervical adenopathy.  Neurological: She is alert and oriented to person, place, and time.  CN grossly intact, station and gait intact Recall 2/3, 3/3 with cue Calculation 4/5 serial 3s Head tremor present  Skin: Skin is warm and dry. No rash noted.  Psychiatric: She has a normal mood and affect. Her behavior is normal. Judgment and thought content normal.  Nursing note and vitals reviewed.  Results for orders placed or performed in visit on 04/17/15  Lipid panel  Result Value Ref Range   Cholesterol 182 0 - 200 mg/dL   Triglycerides 132.0 0.0 - 149.0 mg/dL   HDL 48.40 >39.00 mg/dL   VLDL 26.4 0.0 - 40.0 mg/dL   LDL Cholesterol 107 (H) 0 - 99 mg/dL   Total CHOL/HDL Ratio 4    NonHDL 133.89   Comprehensive metabolic panel  Result Value Ref Range   Sodium 141 135 - 145 mEq/L   Potassium 3.9 3.5 - 5.1 mEq/L   Chloride 103 96 - 112 mEq/L   CO2 31 19 - 32 mEq/L   Glucose, Bld 102 (H) 70 - 99 mg/dL   BUN 17 6 - 23 mg/dL    Creatinine, Ser 0.81 0.40 - 1.20 mg/dL   Total Bilirubin 1.0 0.2 - 1.2 mg/dL   Alkaline Phosphatase 112 39 - 117 U/L   AST 18 0 - 37 U/L   ALT 21 0 - 35 U/L   Total Protein 7.2 6.0 - 8.3 g/dL   Albumin 4.3 3.5 - 5.2 g/dL   Calcium 9.1 8.4 - 10.5 mg/dL   GFR 73.06 >60.00 mL/min  TSH  Result Value Ref Range   TSH 2.07 0.35 - 4.50 uIU/mL  Vit D  25 hydroxy (rtn osteoporosis monitoring)  Result Value Ref Range   VITD 15.38 (L) 30.00 - 100.00 ng/mL      Assessment & Plan:   Problem List Items Addressed This Visit    Atrial fibrillation    Continue coumadin at our clinic. Also followed by cardiology.      Hyperlipidemia  Chronic, stable. Continue simvastatin.      Hypertension    Chronic, stable. Continue antihypertensives.      Hypothyroidism    Chronic, stable on low dose levothyroxine.      Medicare annual wellness visit, initial - Primary    I have personally reviewed the Medicare Annual Wellness questionnaire and have noted 1. The patient's medical and social history 2. Their use of alcohol, tobacco or illicit drugs 3. Their current medications and supplements 4. The patient's functional ability including ADL's, fall risks, home safety risks and hearing or visual impairment. Cognitive function has been assessed and addressed as indicated.  5. Diet and physical activity 6. Evidence for depression or mood disorders The patients weight, height, BMI have been recorded in the chart. I have made referrals, counseling and provided education to the patient based on review of the above and I have provided the pt with a written personalized care plan for preventive services. Provider list updated.. See scanned questionairre as needed for further documentation. Reviewed preventative protocols and updated unless pt declined.       Advanced care planning/counseling discussion    Advanced directives - thinks would want son Mckenzie Frank to be HCPOA. Doesn't have set up. Packet provided  today.      Breast mass, right    Intraductal mass with nipple bleeding - s/p unrevealing biopsy. Pending ductogram next week. Will follow along.       Other Visit Diagnoses    Special screening for malignant neoplasms, colon        Relevant Orders    Fecal occult blood, imunochemical        Follow up plan: Return in about 6 months (around 10/28/2015), or as needed, for follow up visit.

## 2015-04-27 NOTE — Assessment & Plan Note (Signed)

## 2015-04-27 NOTE — Assessment & Plan Note (Signed)
Chronic, stable on low dose levothyroxine.

## 2015-04-27 NOTE — Addendum Note (Signed)
Addended byCloyd Stagers B on: 04/27/2015 11:05 AM   Modules accepted: Orders

## 2015-04-27 NOTE — Assessment & Plan Note (Signed)
Advanced directives - thinks would want son Kasandra Knudsen to be HCPOA. Doesn't have set up. Packet provided today.

## 2015-04-27 NOTE — Assessment & Plan Note (Signed)
Continue coumadin at our clinic. Also followed by cardiology.

## 2015-04-27 NOTE — Assessment & Plan Note (Signed)
Intraductal mass with nipple bleeding - s/p unrevealing biopsy. Pending ductogram next week. Will follow along.

## 2015-05-02 ENCOUNTER — Ambulatory Visit
Admission: RE | Admit: 2015-05-02 | Discharge: 2015-05-02 | Disposition: A | Discharge status: Home / Self Care | Payer: PPO | Source: Ambulatory Visit | Attending: Emergency Medicine | Admitting: Emergency Medicine

## 2015-05-02 ENCOUNTER — Other Ambulatory Visit: Payer: Self-pay | Admitting: Emergency Medicine

## 2015-05-02 DIAGNOSIS — N6452 Nipple discharge: Secondary | ICD-10-CM

## 2015-05-03 ENCOUNTER — Other Ambulatory Visit: Payer: PPO

## 2015-05-03 DIAGNOSIS — Z1211 Encounter for screening for malignant neoplasm of colon: Secondary | ICD-10-CM

## 2015-05-03 LAB — FECAL OCCULT BLOOD, IMMUNOCHEMICAL: FECAL OCCULT BLD: NEGATIVE

## 2015-05-03 LAB — FECAL OCCULT BLOOD, GUAIAC: FECAL OCCULT BLD: NEGATIVE

## 2015-05-04 ENCOUNTER — Encounter: Payer: Self-pay | Admitting: *Deleted

## 2015-05-11 ENCOUNTER — Other Ambulatory Visit: Payer: Self-pay | Admitting: Family Medicine

## 2015-05-11 NOTE — Telephone Encounter (Signed)
Ok to refill 

## 2015-05-11 NOTE — Telephone Encounter (Signed)
Rx called in as directed.   

## 2015-05-11 NOTE — Telephone Encounter (Signed)
plz phone in. 

## 2015-05-12 ENCOUNTER — Ambulatory Visit: Payer: PPO | Admitting: Cardiovascular Disease

## 2015-05-15 ENCOUNTER — Telehealth: Payer: Self-pay

## 2015-05-15 ENCOUNTER — Ambulatory Visit: Payer: PPO

## 2015-05-15 NOTE — Telephone Encounter (Signed)
Pt states that she is having a hard time breathing today. States last time she felt this way she had to have her heart shocked. Please call.

## 2015-05-16 NOTE — Telephone Encounter (Signed)
S/w pt who states she was having difficulty breathing yesterday and "just didn't feel right".  Reports SOB when ambulating but indicates she is better today. Has had two cardioversions and was concerned it was afib. Reports taking flecainide and all other medications as prescribed. Advised pt to get pulse ox so that she can monitor oxygen level and heart rate and report findings to [place]. Confirmed 9/26 appt with Dr. Fletcher Anon and advised pt that if she thinks she is in afib, call back to come in for EKG. Pt verbalized understanding and states if she feels SOB again, she will call back

## 2015-05-18 ENCOUNTER — Telehealth: Payer: Self-pay | Admitting: Cardiovascular Disease

## 2015-05-18 NOTE — Telephone Encounter (Signed)
Feels like she is out of rhythm    Hx of cardioversion and has same feeling as before having trouble with sob on exertion .   Mckenzie Frank has an opening tomorrow .  Please call patient .  [place] !!!!

## 2015-05-18 NOTE — Telephone Encounter (Signed)
S/w pt who states she continues to feel sluggish and weak. Rode to the beach yesterday with her daughter who told pt she needed to come back to see Dr. Fletcher Anon because she's breathing heavily. States she thinks she is in afib. Takes 50mg  metoprolol BID; no missed doses. Offered for pt to come to office today for EKG but she states she will just wait until tomorrow to see Dr. Fletcher Anon.  Forward to MD

## 2015-05-18 NOTE — Telephone Encounter (Signed)
S/w pt regarding Dr. Tyrell Antonio recommendation. Pt verbalized understanding. Confirmed 9/16 appt. Pt had no further questions

## 2015-05-18 NOTE — Telephone Encounter (Signed)
Ask her to take an extra dose of Flecainide.

## 2015-05-19 ENCOUNTER — Encounter: Payer: Self-pay | Admitting: Cardiovascular Disease

## 2015-05-19 ENCOUNTER — Ambulatory Visit (INDEPENDENT_AMBULATORY_CARE_PROVIDER_SITE_OTHER): Payer: PPO | Admitting: Cardiovascular Disease

## 2015-05-19 VITALS — BP 122/78 | HR 82 | Ht 67.5 in | Wt 263.0 lb

## 2015-05-19 DIAGNOSIS — I4891 Unspecified atrial fibrillation: Secondary | ICD-10-CM

## 2015-05-19 DIAGNOSIS — I10 Essential (primary) hypertension: Secondary | ICD-10-CM

## 2015-05-19 DIAGNOSIS — R000 Tachycardia, unspecified: Secondary | ICD-10-CM | POA: Diagnosis not present

## 2015-05-19 DIAGNOSIS — G4730 Sleep apnea, unspecified: Secondary | ICD-10-CM

## 2015-05-19 DIAGNOSIS — Z01812 Encounter for preprocedural laboratory examination: Secondary | ICD-10-CM | POA: Diagnosis not present

## 2015-05-19 DIAGNOSIS — I481 Persistent atrial fibrillation: Secondary | ICD-10-CM

## 2015-05-19 DIAGNOSIS — I4819 Other persistent atrial fibrillation: Secondary | ICD-10-CM

## 2015-05-19 MED ORDER — FLECAINIDE ACETATE 150 MG PO TABS: 150.0000 mg | ORAL_TABLET | Freq: Two times a day (BID) | ORAL | Status: DC

## 2015-05-19 NOTE — Patient Instructions (Addendum)
Medication Instructions:  Your physician has recommended you make the following change in your medication:  INCREASE flecainide to 150mg  twice per day   Labwork: Your physician recommends that you have labs today: BMET, PT/INR, CBC   Testing/Procedures: Your physician has requested that you have an echocardiogram. Echocardiography is a painless test that uses sound waves to create images of your heart. It provides your doctor with information about the size and shape of your heart and how well your heart's chambers and valves are working. This procedure takes approximately one hour. There are no restrictions for this procedure.  Your physician has recommended that you have a Cardioversion (DCCV). Electrical Cardioversion uses a jolt of electricity to your heart either through paddles or wired patches attached to your chest. This is a controlled, usually prescheduled, procedure. Defibrillation is done under light anesthesia in the hospital, and you usually go home the day of the procedure. This is done to get your heart back into a normal rhythm. You are not awake for the procedure. Please see the instruction sheet given to you today.  You are scheduled for a Cardioversion on  September 22, 7:30am  with Dr. Fletcher Anon. Please arrive at the [facility] at 6:45 a.m. on the day of your procedure.   DIET INSTRUCTIONS:  Nothing to eat or drink after midnight except your medications with a sip of water.         1) Labs:  BMET, CBC, PT/INR  2) Medications:  YOU MAY TAKE ALL of your remaining medications with a small amount of water.  3) Must have a responsible person to drive you home.  4) Bring a current list of your medications and current insurance cards.    If you have any questions after you get home, please call the office at [phone]   Follow-Up: Your physician recommends that you schedule a follow-up appointment in: 3-4 weeks with Dr. Fletcher Anon.   Any Other Special Instructions  Will Be Listed Below (If Applicable). Pulmonary referral for sleep apnea  Echocardiogram An echocardiogram, or echocardiography, uses sound waves (ultrasound) to produce an image of your heart. The echocardiogram is simple, painless, obtained within a short period of time, and offers valuable information to your health care provider. The images from an echocardiogram can provide information such as:  Evidence of coronary artery disease (CAD).  Heart size.  Heart muscle function.  Heart valve function.  Aneurysm detection.  Evidence of a past heart attack.  Fluid buildup around the heart.  Heart muscle thickening.  Assess heart valve function. LET [facility] CARE PROVIDER KNOW ABOUT:  Any allergies you have.  All medicines you are taking, including vitamins, herbs, eye drops, creams, and over-the-counter medicines.  Previous problems you or members of your family have had with the use of anesthetics.  Any blood disorders you have.  Previous surgeries you have had.  Medical conditions you have.  Possibility of pregnancy, if this applies. BEFORE THE PROCEDURE  No special preparation is needed. Eat and drink normally.  PROCEDURE   In order to produce an image of your heart, gel will be applied to your chest and a wand-like tool (transducer) will be moved over your chest. The gel will help transmit the sound waves from the transducer. The sound waves will harmlessly bounce off your heart to allow the heart images to be captured in real-time motion. These images will then be recorded.  You may need an IV to receive a medicine that  improves the quality of the pictures. AFTER THE PROCEDURE You may return to your normal schedule including diet, activities, and medicines, unless your health care provider tells you otherwise. Document Released: 08/16/2000 Document Revised: 01/03/2014 Document Reviewed: 04/26/2013 [facility] Patient Information 2015 [place], [place]. This  information is not intended to replace advice given to you by your health care provider. Make sure you discuss any questions you have with your health care provider. Electrical Cardioversion Electrical cardioversion is the delivery of a jolt of electricity to change the rhythm of the heart. Sticky patches or metal paddles are placed on the chest to deliver the electricity from a device. This is done to restore a normal rhythm. A rhythm that is too fast or not regular keeps the heart from pumping well. Electrical cardioversion is done in an emergency if:   There is low or no blood pressure as a result of the heart rhythm.   Normal rhythm must be restored as fast as possible to protect the brain and heart from further damage.   It may save a life. Cardioversion may be done for heart rhythms that are not immediately life threatening, such as atrial fibrillation or flutter, in which:   The heart is beating too fast or is not regular.   Medicine to change the rhythm has not worked.   It is safe to wait in order to allow time for preparation.  Symptoms of the abnormal rhythm are bothersome.  The risk of stroke and other serious problems can be reduced. LET [facility] CARE PROVIDER KNOW ABOUT:   Any allergies you have.  All medicines you are taking, including vitamins, herbs, eye drops, creams, and over-the-counter medicines.  Previous problems you or members of your family have had with the use of anesthetics.   Any blood disorders you have.   Previous surgeries you have had.   Medical conditions you have. RISKS AND COMPLICATIONS  Generally, this is a safe procedure. However, problems can occur and include:   Breathing problems related to the anesthetic used.  A blood clot that breaks free and travels to other parts of your body. This could cause a stroke or other problems. The risk of this is lowered by use of blood-thinning medicine (anticoagulant) prior to the  procedure.  Cardiac arrest (rare). BEFORE THE PROCEDURE   You may have tests to detect blood clots in your heart and to evaluate heart function.  You may start taking anticoagulants so your blood does not clot as easily.   Medicines may be given to help stabilize your heart rate and rhythm. PROCEDURE  You will be given medicine through an IV tube to reduce discomfort and make you sleepy (sedative).   An electrical shock will be delivered. AFTER THE PROCEDURE Your heart rhythm will be watched to make sure it does not change.  Document Released: 08/09/2002 Document Revised: 01/03/2014 Document Reviewed: 03/03/2013 [facility] Patient Information 2015 [place], [place]. This information is not intended to replace advice given to you by your health care provider. Make sure you discuss any questions you have with your health care provider.

## 2015-05-19 NOTE — Assessment & Plan Note (Signed)
Blood pressure is controlled on current medications. 

## 2015-05-19 NOTE — Progress Notes (Signed)
Primary care physician: Dr. Danise Mina  HPI  This is a 74 year old female who is here today for a follow up visit regarding persistent atrial fibrillation.  She has known history of persistent atrial fibrillation diagnosed in 2010. She had cardioversion done once at that time. It seems that she had recurrent atrial fibrillation in 2011 and was placed on flecainide by Dr. Gillian Shields . She reports having a cardiac catheterization many years ago by Dr. Clayborn Bigness which showed no significant coronary artery disease.  Echocardiogram in April of 2014 showed normal LV systolic function, mild left ventricular hypertrophy and only mild mitral regurgitation.  She was hospitalized in January, 2015 with diverticulitis. She developed rectus sheath hematoma from Lovenox injection which was given as bridging. She had multiple complications related to that and required nephrostomy tubes.  She had recurrent atrial fibrillation in February which required another cardioversion. She felt very well after that up until one week ago when she felt palpitations with increased shortness of breath. She usually knows when she goes into atrial fibrillation and does not feel well. She feels tired throughout the day and has not been tested for sleep apnea.   Allergies  Allergen Reactions  . Paxil [Paroxetine Hcl] Other (See Comments)    Shaking   . Clindamycin/Lincomycin Other (See Comments)    unknown  . Codeine Other (See Comments)    unknown  . Doxycycline Other (See Comments)    unknown  . Promethazine Other (See Comments)    unknown  . Sulfa Antibiotics Hives  . Valium Other (See Comments)    unknown     Current Outpatient Prescriptions on File Prior to Visit  Medication Sig Dispense Refill  . acetaminophen (TYLENOL) 500 MG tablet Take 1,000 mg by mouth every 6 (six) hours as needed for moderate pain.    Marland Kitchen amLODipine (NORVASC) 10 MG tablet Take 1 tablet (10 mg total) by mouth daily. 90 tablet 1  . clonazePAM  (KLONOPIN) 0.5 MG tablet TAKE 1 TABLET BY MOUTH AT BEDTIME 30 tablet 0  . fluticasone (FLONASE) 50 MCG/ACT nasal spray USE 2 SPARYS IN EACH NOSTRIL DAILY 16 g 6  . lansoprazole (PREVACID) 15 MG capsule Take 1 capsule (15 mg total) by mouth daily. 90 capsule 1  . levothyroxine (SYNTHROID, LEVOTHROID) 25 MCG tablet Take 1 tablet (25 mcg total) by mouth daily. 90 tablet 1  . losartan (COZAAR) 50 MG tablet Take 1 tablet (50 mg total) by mouth daily. 90 tablet 1  . metoprolol (LOPRESSOR) 50 MG tablet Take 1 tablet (50 mg total) by mouth 2 (two) times daily. 180 tablet 1  . simvastatin (ZOCOR) 20 MG tablet Take 0.5 tablets (10 mg total) by mouth every evening. 45 tablet 1  . triamcinolone cream (KENALOG) 0.1 % APPLY TO AFFECTED AREA TOPICALLY TWICE DAILY (Patient taking differently: APPLY TO AFFECTED AREA TOPICALLY AS NEEDED) 120 g 0  . warfarin (COUMADIN) 5 MG tablet Take 1 tablet (5 mg total) by mouth as directed. (Patient taking differently: Take 5 mg by mouth as directed. 5 mg daily and then one-half on Friday) 90 tablet 1   No current facility-administered medications on file prior to visit.     Past Medical History  Diagnosis Date  . Hyperlipidemia   . Hypertension   . Hypothyroidism     borderline  . Osteoarthritis of knee     s/p replacement  . Abnormal mammogram     left medial breast - benign fat necrosis with residual calcification  .  Benign head tremor   . Atrial fibrillation 2010    s/p DC cardioversion. on coumadin  . Insomnia   . GERD (gastroesophageal reflux disease)   . Chronic bronchitis     "used to get it alot; not so much anymore" (08/27/2013)  . Sleep apnea     "does not wear mask" (08/27/2013)  . Rectus sheath hematoma 08/27/2013    R after bridging with lovenox  . H/O hiatal hernia     moderate sized/notes  . History of diverticulitis of colon 08/2013    sigmoid with possible microperf/abscess s/p hospitalization complicated by rectus sheath hematoma during  lovenox bridge, leading to ARF from hematoma compression on kidney  . RSV bronchitis 09/13/2013     Past Surgical History  Procedure Laterality Date  . Replacement total knee Bilateral 2005  . Appendectomy  1947  . Dilation and curettage of uterus    . Cholecystectomy  1980  . Cardiac catheterization      [facility];Dr. Clayborn Bigness  . Dexa  10/2012    WNL  . Tonsillectomy    . Joint replacement    . Abdominal exploration surgery  1971    "to see if I needed hysterectomy; dr thought qthing looked too good so he didn't do a hysterectomy at this time" (08/27/2013)  . Vaginal hysterectomy  1971    "2 wks after 1st dr said it was ok" (08/27/2013)  . Cardioversion      [facility] 08/27/2013  . Percutaneous nephrostomy  08/2013    ARF from hematoma compression     Family History  Problem Relation Age of Onset  . CAD Mother     angina  . Cancer Paternal Grandfather     ?  Marland Kitchen Alzheimer's disease Father     died from PNA  . Diabetes Mother   . Stroke Mother 19     Social History   Social History  . Marital Status: Widowed    Spouse Name: N/A  . Number of Children: N/A  . Years of Education: N/A   Occupational History  . Retired    Social History Main Topics  . Smoking status: Never Smoker   . Smokeless tobacco: Never Used  . Alcohol Use: No  . Drug Use: No  . Sexual Activity: No   Other Topics Concern  . Not on file   Social History Narrative   Caffeine: 1 cup coffee/day   Lives alone in senior living.  Daughter in [place]   Occupation: retired, Scientist, research (medical)   Edu: HS   Activity: walks the track and mall daily   Diet: good water, fruits/vegetables daily     PHYSICAL EXAM   BP 122/78 mmHg  Pulse 82  Ht 5' 7.5" (1.715 m)  Wt 263 lb (119.296 kg)  BMI 40.56 kg/m2 Constitutional: She is oriented to person, place, and time. She appears well-developed and well-nourished. No distress.  HENT: No nasal discharge.  Head: Normocephalic and atraumatic.  Eyes: Pupils are equal and  round. Right eye exhibits no discharge. Left eye exhibits no discharge.  Neck: Normal range of motion. Neck supple. No JVD present. No thyromegaly present.  Cardiovascular: Normal rate, regular rhythm, normal heart sounds. Exam reveals no gallop and no friction rub. No murmur heard.  Pulmonary/Chest: Effort normal and breath sounds normal. No stridor. No respiratory distress. She has no wheezes. She has no rales. She exhibits no tenderness.  Abdominal: Soft. Bowel sounds are normal. She exhibits no distension. There is no tenderness. There is  no rebound and no guarding.  Musculoskeletal: Normal range of motion. She exhibits no edema and no tenderness.  Neurological: She is alert and oriented to person, place, and time. Coordination normal.  Skin: Skin is warm and dry. No rash noted. She is not diaphoretic. No erythema. No pallor.  Psychiatric: She has a normal mood and affect. Her behavior is normal. Judgment and thought content normal.     EKG: atrial fibrillation  -Nonspecific QRS widening.   ABNORMAL RHYTHM   ASSESSMENT AND PLAN

## 2015-05-19 NOTE — Assessment & Plan Note (Signed)
The patient is back in atrial fibrillation and she is highly symptomatic from this. She is anticoagulation with warfarin. I increased the dose of flecainide 150 mg twice daily. Continue metoprolol at 50 mg twice daily. I'm going to repeat her echocardiogram. I recommend proceeding with cardioversion next week as long as her INR is therapeutic. I discussed risks and benefits. This patient has required multiple cardioversions in spite of being on antiarrhythmics medication. She has symptoms highly suggestive of sleep apnea which might be the culprit for her recurrent episodes. Thus, I referred the patient to pulmonary for evaluation of sleep apnea.  Ablation for atrial fibrillation can be considered. However, given her obesity and likely underlying untreated sleep apnea, she might not be a good candidate at the present time.

## 2015-05-20 LAB — CBC
Hematocrit: 41.6 % (ref 34.0–46.6)
Hemoglobin: 13.6 g/dL (ref 11.1–15.9)
MCH: 28.3 pg (ref 26.6–33.0)
MCHC: 32.7 g/dL (ref 31.5–35.7)
MCV: 87 fL (ref 79–97)
PLATELETS: 272 10*3/uL (ref 150–379)
RBC: 4.8 x10E6/uL (ref 3.77–5.28)
RDW: 14.8 % (ref 12.3–15.4)
WBC: 7.6 10*3/uL (ref 3.4–10.8)

## 2015-05-20 LAB — BASIC METABOLIC PANEL
BUN / CREAT RATIO: 14 (ref 11–26)
BUN: 12 mg/dL (ref 8–27)
CHLORIDE: 102 mmol/L (ref 97–108)
CO2: 22 mmol/L (ref 18–29)
Calcium: 8.9 mg/dL (ref 8.7–10.3)
Creatinine, Ser: 0.88 mg/dL (ref 0.57–1.00)
GFR, EST AFRICAN AMERICAN: 74 mL/min/{1.73_m2} (ref >59)
GFR, EST NON AFRICAN AMERICAN: 64 mL/min/{1.73_m2} (ref >59)
Glucose: 116 mg/dL — ABNORMAL HIGH (ref 65–99)
POTASSIUM: 4.4 mmol/L (ref 3.5–5.2)
SODIUM: 143 mmol/L (ref 134–144)

## 2015-05-20 LAB — PROTIME-INR
INR: 2.2 — AB (ref 0.8–1.2)
PROTHROMBIN TIME: 22.1 s — AB (ref 9.1–12.0)

## 2015-05-24 ENCOUNTER — Telehealth: Payer: Self-pay | Admitting: *Deleted

## 2015-05-24 NOTE — Telephone Encounter (Signed)
05/24/15 Lmom to sched appt with pulmonology for sleep apnea

## 2015-05-25 ENCOUNTER — Ambulatory Visit (INDEPENDENT_AMBULATORY_CARE_PROVIDER_SITE_OTHER): Payer: PPO | Admitting: *Deleted

## 2015-05-25 ENCOUNTER — Encounter: Payer: Self-pay | Admitting: *Deleted

## 2015-05-25 ENCOUNTER — Ambulatory Visit: Payer: PPO | Admitting: Anesthesiology

## 2015-05-25 ENCOUNTER — Ambulatory Visit
Admission: RE | Admit: 2015-05-25 | Discharge: 2015-05-25 | Disposition: A | Discharge status: Home / Self Care | Payer: PPO | Source: Ambulatory Visit | Attending: Cardiovascular Disease | Admitting: Cardiovascular Disease

## 2015-05-25 ENCOUNTER — Encounter
Admission: RE | Disposition: A | Discharge status: Home / Self Care | Payer: Self-pay | Source: Ambulatory Visit | Attending: Cardiovascular Disease

## 2015-05-25 DIAGNOSIS — Z5181 Encounter for therapeutic drug level monitoring: Secondary | ICD-10-CM

## 2015-05-25 DIAGNOSIS — G4700 Insomnia, unspecified: Secondary | ICD-10-CM | POA: Diagnosis not present

## 2015-05-25 DIAGNOSIS — Z8249 Family history of ischemic heart disease and other diseases of the circulatory system: Secondary | ICD-10-CM | POA: Diagnosis not present

## 2015-05-25 DIAGNOSIS — I481 Persistent atrial fibrillation: Secondary | ICD-10-CM | POA: Diagnosis not present

## 2015-05-25 DIAGNOSIS — E785 Hyperlipidemia, unspecified: Secondary | ICD-10-CM | POA: Diagnosis not present

## 2015-05-25 DIAGNOSIS — Z823 Family history of stroke: Secondary | ICD-10-CM | POA: Insufficient documentation

## 2015-05-25 DIAGNOSIS — E039 Hypothyroidism, unspecified: Secondary | ICD-10-CM | POA: Insufficient documentation

## 2015-05-25 DIAGNOSIS — Z9049 Acquired absence of other specified parts of digestive tract: Secondary | ICD-10-CM | POA: Diagnosis not present

## 2015-05-25 DIAGNOSIS — Z885 Allergy status to narcotic agent status: Secondary | ICD-10-CM | POA: Diagnosis not present

## 2015-05-25 DIAGNOSIS — I340 Nonrheumatic mitral (valve) insufficiency: Secondary | ICD-10-CM | POA: Diagnosis not present

## 2015-05-25 DIAGNOSIS — M179 Osteoarthritis of knee, unspecified: Secondary | ICD-10-CM | POA: Insufficient documentation

## 2015-05-25 DIAGNOSIS — Z96653 Presence of artificial knee joint, bilateral: Secondary | ICD-10-CM | POA: Diagnosis not present

## 2015-05-25 DIAGNOSIS — Z79899 Other long term (current) drug therapy: Secondary | ICD-10-CM | POA: Diagnosis not present

## 2015-05-25 DIAGNOSIS — Z8052 Family history of malignant neoplasm of bladder: Secondary | ICD-10-CM | POA: Diagnosis not present

## 2015-05-25 DIAGNOSIS — K219 Gastro-esophageal reflux disease without esophagitis: Secondary | ICD-10-CM | POA: Diagnosis not present

## 2015-05-25 DIAGNOSIS — Z7901 Long term (current) use of anticoagulants: Secondary | ICD-10-CM | POA: Insufficient documentation

## 2015-05-25 DIAGNOSIS — K449 Diaphragmatic hernia without obstruction or gangrene: Secondary | ICD-10-CM | POA: Diagnosis not present

## 2015-05-25 DIAGNOSIS — Z888 Allergy status to other drugs, medicaments and biological substances status: Secondary | ICD-10-CM | POA: Insufficient documentation

## 2015-05-25 DIAGNOSIS — Z833 Family history of diabetes mellitus: Secondary | ICD-10-CM | POA: Diagnosis not present

## 2015-05-25 DIAGNOSIS — Z8489 Family history of other specified conditions: Secondary | ICD-10-CM | POA: Insufficient documentation

## 2015-05-25 DIAGNOSIS — I10 Essential (primary) hypertension: Secondary | ICD-10-CM | POA: Diagnosis not present

## 2015-05-25 DIAGNOSIS — G4730 Sleep apnea, unspecified: Secondary | ICD-10-CM | POA: Diagnosis not present

## 2015-05-25 DIAGNOSIS — Z882 Allergy status to sulfonamides status: Secondary | ICD-10-CM | POA: Diagnosis not present

## 2015-05-25 DIAGNOSIS — Z90710 Acquired absence of both cervix and uterus: Secondary | ICD-10-CM | POA: Insufficient documentation

## 2015-05-25 DIAGNOSIS — I4891 Unspecified atrial fibrillation: Secondary | ICD-10-CM | POA: Diagnosis not present

## 2015-05-25 HISTORY — PX: ELECTROPHYSIOLOGIC STUDY: SHX172A

## 2015-05-25 LAB — PROTIME-INR
INR: 2.06
PROTHROMBIN TIME: 23.4 s — AB (ref 11.4–15.0)

## 2015-05-25 LAB — POCT INR: INR: 2.8

## 2015-05-25 SURGERY — CARDIOVERSION (CATH LAB)
Anesthesia: General

## 2015-05-25 MED ORDER — MIDAZOLAM HCL 2 MG/2ML IJ SOLN
INTRAMUSCULAR | Status: DC | PRN
Start: 2015-05-25 — End: 2015-05-25
  Administered 2015-05-25: 1.5 mg via INTRAVENOUS

## 2015-05-25 MED ORDER — SODIUM CHLORIDE 0.9 % IV SOLN
INTRAVENOUS | Status: DC
  Administered 2015-05-25: 08:00:00 via INTRAVENOUS

## 2015-05-25 MED ORDER — FENTANYL CITRATE (PF) 100 MCG/2ML IJ SOLN
INTRAMUSCULAR | Status: DC | PRN
  Administered 2015-05-25: 50 ug via INTRAVENOUS

## 2015-05-25 MED ORDER — PROPOFOL 10 MG/ML IV BOLUS
INTRAVENOUS | Status: DC | PRN
  Administered 2015-05-25 (×2): 100 mg via INTRAVENOUS

## 2015-05-25 NOTE — Anesthesia Preprocedure Evaluation (Addendum)
Anesthesia Evaluation  Patient identified by MRN, date of birth, ID band Patient awake    Reviewed: Allergy & Precautions, NPO status , Patient's Chart, lab work & pertinent test results  Airway Mallampati: II  TM Distance: >3 FB Neck ROM: Full    Dental  (+) Partial Lower   Pulmonary asthma ,  Sleep disorder   Pulmonary exam normal        Cardiovascular hypertension, Pt. on medications + dysrhythmias Atrial Fibrillation + Valvular Problems/Murmurs MR  Rhythm:Irregular Rate:Abnormal     Neuro/Psych negative neurological ROS  negative psych ROS   GI/Hepatic Neg liver ROS, hiatal hernia, GERD  Medicated and Controlled,  Endo/Other  Hypothyroidism   Renal/GU negative Renal ROS  negative genitourinary   Musculoskeletal  (+) Arthritis , Osteoarthritis,    Abdominal Normal abdominal exam  (+)   Peds negative pediatric ROS (+)  Hematology   Anesthesia Other Findings   Reproductive/Obstetrics                          Anesthesia Physical Anesthesia Plan  ASA: III  Anesthesia Plan: General   Post-op Pain Management:    Induction: Intravenous  Airway Management Planned: Nasal Cannula  Additional Equipment:   Intra-op Plan:   Post-operative Plan:   Informed Consent: I have reviewed the patients History and Physical, chart, labs and discussed the procedure including the risks, benefits and alternatives for the proposed anesthesia with the patient or authorized representative who has indicated his/her understanding and acceptance.   Dental advisory given  Plan Discussed with: CRNA and Surgeon  Anesthesia Plan Comments:         Anesthesia Quick Evaluation

## 2015-05-25 NOTE — Anesthesia Postprocedure Evaluation (Signed)
  Anesthesia Post-op Note  Patient: Mckenzie Frank  Procedure(s) Performed: Procedure(s): Cardioversion (N/A)  Anesthesia type:General  Patient location: PACU  Post pain: Pain level controlled  Post assessment: Post-op Vital signs reviewed, Patient's Cardiovascular Status Stable, Respiratory Function Stable, Patent Airway and No signs of Nausea or vomiting  Post vital signs: Reviewed and stable  Last Vitals:  Filed Vitals:   05/25/15 0842  BP:   Pulse: 52  Temp:   Resp: 19    Level of consciousness: awake, alert  and patient cooperative  Complications: No apparent anesthesia complications

## 2015-05-25 NOTE — Interval H&P Note (Signed)
History and Physical Interval Note:  05/25/2015 7:58 AM  Mckenzie Frank  has presented today for surgery, with the diagnosis of Afib  The various methods of treatment have been discussed with the patient and family. After consideration of risks, benefits and other options for treatment, the patient has consented to  Procedure(s): Cardioversion (N/A) as a surgical intervention .  The patient's history has been reviewed, patient examined, no change in status, stable for surgery.  I have reviewed the patient's chart and labs.  Questions were answered to the patient's satisfaction.     Kathlyn Sacramento

## 2015-05-25 NOTE — CV Procedure (Signed)
Cardioversion note: A standard informed consent was obtained. Timeout was performed. The pads were placed in the anterior posterior fashion. The patient has been on appropriate anticoagulation with therapeutic INR.  The patient was given propofol by the anesthesia team.  Successful cardioversion was performed with a 200 J. The patient converted to sinus rhythm. Pre-and post EKGs were reviewed. The patient tolerated the procedure with no immediate complications.  Recommendations: Continue same medications and follow-up in 2-3 weeks. Proceed with evaluation for sleep apnea.

## 2015-05-25 NOTE — Transfer of Care (Signed)
Immediate Anesthesia Transfer of Care Note  Patient: Mckenzie Frank  Procedure(s) Performed: Procedure(s): Cardioversion (N/A)  Patient Location: PACU  Anesthesia Type:General  Level of Consciousness: awake and sedated  Airway & Oxygen Therapy: Patient Spontanous Breathing and Patient connected to nasal cannula oxygen  Post-op Assessment: Report given to RN and Post -op Vital signs reviewed and stable  Post vital signs: Reviewed and stable  Last Vitals:  Filed Vitals:   05/25/15 0817  BP: 144/91  Pulse: 55  Temp:   Resp: 20    Complications: No apparent anesthesia complications

## 2015-05-25 NOTE — Progress Notes (Signed)
Pre visit review using our clinic review tool, if applicable. No additional management support is needed unless otherwise documented below in the visit note. 

## 2015-05-25 NOTE — H&P (View-Only) (Signed)
Primary care physician: Dr. Danise Mina  HPI  This is a 74 year old female who is here today for a follow up visit regarding persistent atrial fibrillation.  She has known history of persistent atrial fibrillation diagnosed in 2010. She had cardioversion done once at that time. It seems that she had recurrent atrial fibrillation in 2011 and was placed on flecainide by Dr. Gillian Shields . She reports having a cardiac catheterization many years ago by Dr. Clayborn Bigness which showed no significant coronary artery disease.  Echocardiogram in April of 2014 showed normal LV systolic function, mild left ventricular hypertrophy and only mild mitral regurgitation.  She was hospitalized in January, 2015 with diverticulitis. She developed rectus sheath hematoma from Lovenox injection which was given as bridging. She had multiple complications related to that and required nephrostomy tubes.  She had recurrent atrial fibrillation in February which required another cardioversion. She felt very well after that up until one week ago when she felt palpitations with increased shortness of breath. She usually knows when she goes into atrial fibrillation and does not feel well. She feels tired throughout the day and has not been tested for sleep apnea.   Allergies  Allergen Reactions  . Paxil [Paroxetine Hcl] Other (See Comments)    Shaking   . Clindamycin/Lincomycin Other (See Comments)    unknown  . Codeine Other (See Comments)    unknown  . Doxycycline Other (See Comments)    unknown  . Promethazine Other (See Comments)    unknown  . Sulfa Antibiotics Hives  . Valium Other (See Comments)    unknown     Current Outpatient Prescriptions on File Prior to Visit  Medication Sig Dispense Refill  . acetaminophen (TYLENOL) 500 MG tablet Take 1,000 mg by mouth every 6 (six) hours as needed for moderate pain.    Marland Kitchen amLODipine (NORVASC) 10 MG tablet Take 1 tablet (10 mg total) by mouth daily. 90 tablet 1  . clonazePAM  (KLONOPIN) 0.5 MG tablet TAKE 1 TABLET BY MOUTH AT BEDTIME 30 tablet 0  . fluticasone (FLONASE) 50 MCG/ACT nasal spray USE 2 SPARYS IN EACH NOSTRIL DAILY 16 g 6  . lansoprazole (PREVACID) 15 MG capsule Take 1 capsule (15 mg total) by mouth daily. 90 capsule 1  . levothyroxine (SYNTHROID, LEVOTHROID) 25 MCG tablet Take 1 tablet (25 mcg total) by mouth daily. 90 tablet 1  . losartan (COZAAR) 50 MG tablet Take 1 tablet (50 mg total) by mouth daily. 90 tablet 1  . metoprolol (LOPRESSOR) 50 MG tablet Take 1 tablet (50 mg total) by mouth 2 (two) times daily. 180 tablet 1  . simvastatin (ZOCOR) 20 MG tablet Take 0.5 tablets (10 mg total) by mouth every evening. 45 tablet 1  . triamcinolone cream (KENALOG) 0.1 % APPLY TO AFFECTED AREA TOPICALLY TWICE DAILY (Patient taking differently: APPLY TO AFFECTED AREA TOPICALLY AS NEEDED) 120 g 0  . warfarin (COUMADIN) 5 MG tablet Take 1 tablet (5 mg total) by mouth as directed. (Patient taking differently: Take 5 mg by mouth as directed. 5 mg daily and then one-half on Friday) 90 tablet 1   No current facility-administered medications on file prior to visit.     Past Medical History  Diagnosis Date  . Hyperlipidemia   . Hypertension   . Hypothyroidism     borderline  . Osteoarthritis of knee     s/p replacement  . Abnormal mammogram     left medial breast - benign fat necrosis with residual calcification  .  Benign head tremor   . Atrial fibrillation 2010    s/p DC cardioversion. on coumadin  . Insomnia   . GERD (gastroesophageal reflux disease)   . Chronic bronchitis     "used to get it alot; not so much anymore" (08/27/2013)  . Sleep apnea     "does not wear mask" (08/27/2013)  . Rectus sheath hematoma 08/27/2013    R after bridging with lovenox  . H/O hiatal hernia     moderate sized/notes  . History of diverticulitis of colon 08/2013    sigmoid with possible microperf/abscess s/p hospitalization complicated by rectus sheath hematoma during  lovenox bridge, leading to ARF from hematoma compression on kidney  . RSV bronchitis 09/13/2013     Past Surgical History  Procedure Laterality Date  . Replacement total knee Bilateral 2005  . Appendectomy  1947  . Dilation and curettage of uterus    . Cholecystectomy  1980  . Cardiac catheterization      [facility];Dr. Clayborn Bigness  . Dexa  10/2012    WNL  . Tonsillectomy    . Joint replacement    . Abdominal exploration surgery  1971    "to see if I needed hysterectomy; dr thought qthing looked too good so he didn't do a hysterectomy at this time" (08/27/2013)  . Vaginal hysterectomy  1971    "2 wks after 1st dr said it was ok" (08/27/2013)  . Cardioversion      [facility] 08/27/2013  . Percutaneous nephrostomy  08/2013    ARF from hematoma compression     Family History  Problem Relation Age of Onset  . CAD Mother     angina  . Cancer Paternal Grandfather     ?  Marland Kitchen Alzheimer's disease Father     died from PNA  . Diabetes Mother   . Stroke Mother 65     Social History   Social History  . Marital Status: Widowed    Spouse Name: N/A  . Number of Children: N/A  . Years of Education: N/A   Occupational History  . Retired    Social History Main Topics  . Smoking status: Never Smoker   . Smokeless tobacco: Never Used  . Alcohol Use: No  . Drug Use: No  . Sexual Activity: No   Other Topics Concern  . Not on file   Social History Narrative   Caffeine: 1 cup coffee/day   Lives alone in senior living.  Daughter in [place]   Occupation: retired, Scientist, research (medical)   Edu: HS   Activity: walks the track and mall daily   Diet: good water, fruits/vegetables daily     PHYSICAL EXAM   BP 122/78 mmHg  Pulse 82  Ht 5' 7.5" (1.715 m)  Wt 263 lb (119.296 kg)  BMI 40.56 kg/m2 Constitutional: She is oriented to person, place, and time. She appears well-developed and well-nourished. No distress.  HENT: No nasal discharge.  Head: Normocephalic and atraumatic.  Eyes: Pupils are equal and  round. Right eye exhibits no discharge. Left eye exhibits no discharge.  Neck: Normal range of motion. Neck supple. No JVD present. No thyromegaly present.  Cardiovascular: Normal rate, regular rhythm, normal heart sounds. Exam reveals no gallop and no friction rub. No murmur heard.  Pulmonary/Chest: Effort normal and breath sounds normal. No stridor. No respiratory distress. She has no wheezes. She has no rales. She exhibits no tenderness.  Abdominal: Soft. Bowel sounds are normal. She exhibits no distension. There is no tenderness. There is  no rebound and no guarding.  Musculoskeletal: Normal range of motion. She exhibits no edema and no tenderness.  Neurological: She is alert and oriented to person, place, and time. Coordination normal.  Skin: Skin is warm and dry. No rash noted. She is not diaphoretic. No erythema. No pallor.  Psychiatric: She has a normal mood and affect. Her behavior is normal. Judgment and thought content normal.     EKG: atrial fibrillation  -Nonspecific QRS widening.   ABNORMAL RHYTHM   ASSESSMENT AND PLAN

## 2015-05-29 ENCOUNTER — Ambulatory Visit: Payer: PPO

## 2015-05-29 ENCOUNTER — Ambulatory Visit: Payer: PPO | Admitting: Cardiovascular Disease

## 2015-06-02 ENCOUNTER — Other Ambulatory Visit: Payer: Self-pay

## 2015-06-09 ENCOUNTER — Encounter: Payer: Self-pay | Admitting: Cardiovascular Disease

## 2015-06-09 ENCOUNTER — Ambulatory Visit (INDEPENDENT_AMBULATORY_CARE_PROVIDER_SITE_OTHER): Payer: PPO | Admitting: Cardiovascular Disease

## 2015-06-09 VITALS — BP 130/72 | HR 56 | Ht 67.5 in | Wt 263.0 lb

## 2015-06-09 DIAGNOSIS — I4891 Unspecified atrial fibrillation: Secondary | ICD-10-CM

## 2015-06-09 DIAGNOSIS — G4730 Sleep apnea, unspecified: Secondary | ICD-10-CM

## 2015-06-09 DIAGNOSIS — I10 Essential (primary) hypertension: Secondary | ICD-10-CM

## 2015-06-09 DIAGNOSIS — G478 Other sleep disorders: Secondary | ICD-10-CM

## 2015-06-09 DIAGNOSIS — I4819 Other persistent atrial fibrillation: Secondary | ICD-10-CM

## 2015-06-09 DIAGNOSIS — I481 Persistent atrial fibrillation: Secondary | ICD-10-CM

## 2015-06-09 MED ORDER — METOPROLOL TARTRATE 25 MG PO TABS: 25.0000 mg | ORAL_TABLET | Freq: Two times a day (BID) | ORAL | Status: DC

## 2015-06-09 NOTE — Assessment & Plan Note (Signed)
She is maintaining in sinus rhythm after recent successful cardioversion. Continue current dose of flecainide. She is mildly bradycardic and thus I elected to decrease the dose of metoprolol to 50 mg twice daily. Continue long-term anticoagulation with warfarin.

## 2015-06-09 NOTE — Assessment & Plan Note (Signed)
The patient is going to have sleep study done in the near future. She might have underlying sleep apnea contributing to recurrent atrial fibrillation.

## 2015-06-09 NOTE — Assessment & Plan Note (Signed)
Blood pressure is well controlled on current medications. Continue with low sodium diet current medications.

## 2015-06-09 NOTE — Patient Instructions (Signed)
Medication Instructions:  Your physician has recommended you make the following change in your medication:  DECREASE metoprolol to 25mg  twice per day   Labwork: none  Testing/Procedures: none  Follow-Up: Your physician recommends that you schedule a follow-up appointment in: three months with Dr. Fletcher Anon.    Any Other Special Instructions Will Be Listed Below (If Applicable).

## 2015-06-09 NOTE — Progress Notes (Signed)
Primary care physician: Dr. Danise Mina  HPI  This is a 74 year old female who is here today for a follow up visit regarding persistent atrial fibrillation.  She has known history of persistent atrial fibrillation diagnosed in 2010. She had cardioversion done once at that time. It seems that she had recurrent atrial fibrillation in 2011 and was placed on flecainide by Dr. Gillian Shields . She reports having a cardiac catheterization many years ago by Dr. Clayborn Bigness which showed no significant coronary artery disease.  Echocardiogram in April of 2014 showed normal LV systolic function, mild left ventricular hypertrophy and only mild mitral regurgitation.  She was hospitalized in January, 2015 with diverticulitis. She developed rectus sheath hematoma from Lovenox injection which was given as bridging. She had multiple complications related to that and required nephrostomy tubes.  She had cardioversion done in February for recurrent atrial fibrillation. She maintained in sinus rhythm until recently when she was found to have symptomatic atrial fibrillation. I increased the dose of flecainide 150 mg twice daily and proceeded with successful cardioversion. She has been maintaining in sinus rhythm since then with improvement in symptoms. She does have symptoms suggestive of sleep apnea and she is going for evaluation in the near future. She is trying to lose weight with diet.   Allergies  Allergen Reactions  . Paxil [Paroxetine Hcl] Other (See Comments)    Shaking   . Clindamycin/Lincomycin Other (See Comments)    unknown  . Codeine Other (See Comments)    unknown  . Doxycycline Other (See Comments)    unknown  . Promethazine Other (See Comments)    unknown  . Sulfa Antibiotics Hives  . Valium Other (See Comments)    unknown     Current Outpatient Prescriptions on File Prior to Visit  Medication Sig Dispense Refill  . acetaminophen (TYLENOL) 500 MG tablet Take 1,000 mg by mouth every 6 (six) hours as  needed for moderate pain.    Marland Kitchen amLODipine (NORVASC) 10 MG tablet Take 1 tablet (10 mg total) by mouth daily. 90 tablet 1  . clonazePAM (KLONOPIN) 0.5 MG tablet TAKE 1 TABLET BY MOUTH AT BEDTIME 30 tablet 0  . flecainide (TAMBOCOR) 150 MG tablet Take 1 tablet (150 mg total) by mouth 2 (two) times daily. 60 tablet 0  . fluticasone (FLONASE) 50 MCG/ACT nasal spray USE 2 SPARYS IN EACH NOSTRIL DAILY 16 g 6  . lansoprazole (PREVACID) 15 MG capsule Take 1 capsule (15 mg total) by mouth daily. 90 capsule 1  . levothyroxine (SYNTHROID, LEVOTHROID) 25 MCG tablet Take 1 tablet (25 mcg total) by mouth daily. 90 tablet 1  . losartan (COZAAR) 50 MG tablet Take 1 tablet (50 mg total) by mouth daily. 90 tablet 1  . simvastatin (ZOCOR) 20 MG tablet Take 0.5 tablets (10 mg total) by mouth every evening. 45 tablet 1  . triamcinolone cream (KENALOG) 0.1 % APPLY TO AFFECTED AREA TOPICALLY TWICE DAILY (Patient taking differently: APPLY TO AFFECTED AREA TOPICALLY AS NEEDED) 120 g 0  . warfarin (COUMADIN) 5 MG tablet Take 1 tablet (5 mg total) by mouth as directed. (Patient taking differently: Take 5 mg by mouth as directed. 5 mg daily and then one-half on Friday) 90 tablet 1   No current facility-administered medications on file prior to visit.     Past Medical History  Diagnosis Date  . Hyperlipidemia   . Hypertension   . Hypothyroidism     borderline  . Osteoarthritis of knee     s/p  replacement  . Abnormal mammogram     left medial breast - benign fat necrosis with residual calcification  . Benign head tremor   . Atrial fibrillation ([place]) 2010    s/p DC cardioversion. on coumadin  . Insomnia   . GERD (gastroesophageal reflux disease)   . Chronic bronchitis ([place])     "used to get it alot; not so much anymore" (08/27/2013)  . Sleep apnea     "does not wear mask" (08/27/2013)  . Rectus sheath hematoma 08/27/2013    R after bridging with lovenox  . H/O hiatal hernia     moderate sized/notes  .  History of diverticulitis of colon 08/2013    sigmoid with possible microperf/abscess s/p hospitalization complicated by rectus sheath hematoma during lovenox bridge, leading to ARF from hematoma compression on kidney  . RSV bronchitis 09/13/2013     Past Surgical History  Procedure Laterality Date  . Replacement total knee Bilateral 2005  . Appendectomy  1947  . Dilation and curettage of uterus    . Cholecystectomy  1980  . Cardiac catheterization      [facility];Dr. Clayborn Bigness  . Dexa  10/2012    WNL  . Tonsillectomy    . Joint replacement    . Abdominal exploration surgery  1971    "to see if I needed hysterectomy; dr thought qthing looked too good so he didn't do a hysterectomy at this time" (08/27/2013)  . Vaginal hysterectomy  1971    "2 wks after 1st dr said it was ok" (08/27/2013)  . Cardioversion      [facility] 08/27/2013  . Percutaneous nephrostomy  08/2013    ARF from hematoma compression  . Electrophysiologic study N/A 05/25/2015    Procedure: Cardioversion;  Surgeon: Wellington Hampshire, MD;  Location: [facility] ORS;  Service: Cardiovascular;  Laterality: N/A;     Family History  Problem Relation Age of Onset  . CAD Mother     angina  . Cancer Paternal Grandfather     ?  Marland Kitchen Alzheimer's disease Father     died from PNA  . Diabetes Mother   . Stroke Mother 56     Social History   Social History  . Marital Status: Widowed    Spouse Name: N/A  . Number of Children: N/A  . Years of Education: N/A   Occupational History  . Retired    Social History Main Topics  . Smoking status: Never Smoker   . Smokeless tobacco: Never Used  . Alcohol Use: No  . Drug Use: No  . Sexual Activity: No   Other Topics Concern  . Not on file   Social History Narrative   Caffeine: 1 cup coffee/day   Lives alone in senior living.  Daughter in [place]   Occupation: retired, Scientist, research (medical)   Edu: HS   Activity: walks the track and mall daily   Diet: good water, fruits/vegetables daily      PHYSICAL EXAM   BP 130/72 mmHg  Pulse 56  Ht 5' 7.5" (1.715 m)  Wt 263 lb (119.296 kg)  BMI 40.56 kg/m2 Constitutional: She is oriented to person, place, and time. She appears well-developed and well-nourished. No distress.  HENT: No nasal discharge.  Head: Normocephalic and atraumatic.  Eyes: Pupils are equal and round. Right eye exhibits no discharge. Left eye exhibits no discharge.  Neck: Normal range of motion. Neck supple. No JVD present. No thyromegaly present.  Cardiovascular: Normal rate, regular rhythm, normal heart sounds. Exam reveals no  gallop and no friction rub. No murmur heard.  Pulmonary/Chest: Effort normal and breath sounds normal. No stridor. No respiratory distress. She has no wheezes. She has no rales. She exhibits no tenderness.  Abdominal: Soft. Bowel sounds are normal. She exhibits no distension. There is no tenderness. There is no rebound and no guarding.  Musculoskeletal: Normal range of motion. She exhibits no edema and no tenderness.  Neurological: She is alert and oriented to person, place, and time. Coordination normal.  Skin: Skin is warm and dry. No rash noted. She is not diaphoretic. No erythema. No pallor.  Psychiatric: She has a normal mood and affect. Her behavior is normal. Judgment and thought content normal.     EKG: atrial fibrillation  -Nonspecific QRS widening.   ABNORMAL RHYTHM   ASSESSMENT AND PLAN

## 2015-06-12 ENCOUNTER — Other Ambulatory Visit: Payer: Self-pay | Admitting: Cardiovascular Disease

## 2015-06-12 ENCOUNTER — Other Ambulatory Visit: Payer: Self-pay | Admitting: Family Medicine

## 2015-06-12 NOTE — Telephone Encounter (Signed)
Last filled #30 05/11/15

## 2015-06-12 NOTE — Telephone Encounter (Signed)
plz phone in. 

## 2015-06-13 NOTE — Telephone Encounter (Signed)
Rx called in as directed.   

## 2015-06-16 ENCOUNTER — Ambulatory Visit (INDEPENDENT_AMBULATORY_CARE_PROVIDER_SITE_OTHER): Payer: PPO | Admitting: *Deleted

## 2015-06-16 ENCOUNTER — Other Ambulatory Visit: Payer: Self-pay

## 2015-06-16 ENCOUNTER — Ambulatory Visit (INDEPENDENT_AMBULATORY_CARE_PROVIDER_SITE_OTHER): Payer: PPO

## 2015-06-16 DIAGNOSIS — Z5181 Encounter for therapeutic drug level monitoring: Secondary | ICD-10-CM | POA: Diagnosis not present

## 2015-06-16 DIAGNOSIS — I4891 Unspecified atrial fibrillation: Secondary | ICD-10-CM

## 2015-06-16 LAB — POCT INR: INR: 2

## 2015-06-16 NOTE — Progress Notes (Signed)
Pre visit review using our clinic review tool, if applicable. No additional management support is needed unless otherwise documented below in the visit note. 

## 2015-06-22 ENCOUNTER — Ambulatory Visit: Payer: Self-pay

## 2015-06-23 ENCOUNTER — Ambulatory Visit (INDEPENDENT_AMBULATORY_CARE_PROVIDER_SITE_OTHER): Payer: PPO | Admitting: Internal Medicine

## 2015-06-23 ENCOUNTER — Encounter: Payer: Self-pay | Admitting: Internal Medicine

## 2015-06-23 VITALS — BP 128/68 | HR 58 | Ht 67.0 in | Wt 273.0 lb

## 2015-06-23 DIAGNOSIS — G4733 Obstructive sleep apnea (adult) (pediatric): Secondary | ICD-10-CM | POA: Diagnosis not present

## 2015-06-23 DIAGNOSIS — E039 Hypothyroidism, unspecified: Secondary | ICD-10-CM | POA: Diagnosis not present

## 2015-06-23 DIAGNOSIS — I482 Chronic atrial fibrillation, unspecified: Secondary | ICD-10-CM

## 2015-06-23 DIAGNOSIS — K219 Gastro-esophageal reflux disease without esophagitis: Secondary | ICD-10-CM

## 2015-06-23 DIAGNOSIS — G4719 Other hypersomnia: Secondary | ICD-10-CM

## 2015-06-23 NOTE — Progress Notes (Signed)
[facility] Consultation      Assessment and Plan:  Excessive daytime sleepiness, symptoms and signs of obstructive sleep apnea. --Symptoms and signs of obstructive sleep apnea as well as uncontrolled Afib. Will send for sleep study.   Obstructive sleep apnea.  -Known diagnosis several years ago but refused cpap at that time. Is willing to try again now. Per insurance, we will need to start with another sleep study as it has been more than 1 year since last sleep study.   Paroxysmal atrial fibrillation. -May be related to OSA.   Essential hypertension. Controlled.   Hypothyroidism. -Currently controlled on levothyroxine.  GERD -Currently controlled on lansoprazole.  Obesity.  -Weight loss would be beneficial.   Insomnia.  -Takes klonopin which is both for insomnia and benign head tremor. Will continue for now, as weaning this may be problematic.   Date: 06/23/2015  MRN# [id number] Mckenzie Frank Apr 02, 1941  Referring Physician:  Dr. Danise Mina.   Mckenzie Frank is a 74 y.o. old female seen in consultation for chief complaint of:    Chief Complaint  Patient presents with  . sleep consult    pt. ref. by dr. Garlon Hatchet. pt. states she has loud snoring and trouble sleeping. wakes about 3 time a night.    HPI:  The patient is a 74 year old female with a history of persistent and symptomatic atrial fibrillation requiring treatment with flecainide and multiple cardioversions. She has a history of diverticulitis, rectus sheath hematoma secondary to Lovenox injections, and history of nephrostomy tubes.  She notes that she is sleepy if she is reading. Otherwise she is not certain if she is sleepy.  Goes to bed at 10, wakes at 5 am and has trouble falling back to sleep. She takes klonopin at  0.5 mg around 9:30 pm which helps her fall asleep. She also takes it for her benign head tremor. She has tried not taking it and found that she could not fall asleep.  She  does occasionally take naps during the day. Her daughter tells her that when she sits in her recliner she will often go to sleep. Her daughter has OSA and is on CPAP, as well as the pt's son-in-law.   She does not snore at night as far as she knows. She does not have a bed partner.   She had a sleep study with Dr. Chancy Milroy, she thinks about 4 years ago. She then was sent for a second test which sounds like a titration study. She did not really want cpap at that time and did not follow up.  However she is now interested in starting it now.   No flowsheet data found.  Pulmonary Functions Testing Results:  No results found for: FEV1, FVC, FEV1FVC, TLC, DLCO   PMHX:   Past Medical History  Diagnosis Date  . Hyperlipidemia   . Hypertension   . Hypothyroidism     borderline  . Osteoarthritis of knee     s/p replacement  . Abnormal mammogram     left medial breast - benign fat necrosis with residual calcification  . Benign head tremor   . Atrial fibrillation ([place]) 2010    s/p DC cardioversion. on coumadin  . Insomnia   . GERD (gastroesophageal reflux disease)   . Chronic bronchitis ([place])     "used to get it alot; not so much anymore" (08/27/2013)  . Sleep apnea     "does not wear mask" (08/27/2013)  . Rectus sheath hematoma 08/27/2013  R after bridging with lovenox  . H/O hiatal hernia     moderate sized/notes  . History of diverticulitis of colon 08/2013    sigmoid with possible microperf/abscess s/p hospitalization complicated by rectus sheath hematoma during lovenox bridge, leading to ARF from hematoma compression on kidney  . RSV bronchitis 09/13/2013   Surgical Hx:  Past Surgical History  Procedure Laterality Date  . Replacement total knee Bilateral 2005  . Appendectomy  1947  . Dilation and curettage of uterus    . Cholecystectomy  1980  . Cardiac catheterization      [facility];Dr. Clayborn Bigness  . Dexa  10/2012    WNL  . Tonsillectomy    . Joint replacement    . Abdominal  exploration surgery  1971    "to see if I needed hysterectomy; dr thought qthing looked too good so he didn't do a hysterectomy at this time" (08/27/2013)  . Vaginal hysterectomy  1971    "2 wks after 1st dr said it was ok" (08/27/2013)  . Cardioversion      [facility] Endo 08/27/2013  . Percutaneous nephrostomy  08/2013    ARF from hematoma compression  . Electrophysiologic study N/A 05/25/2015    Procedure: Cardioversion;  Surgeon: Wellington Hampshire, MD;  Location: [facility] ORS;  Service: Cardiovascular;  Laterality: N/A;   Family Hx:  Family History  Problem Relation Age of Onset  . CAD Mother     angina  . Cancer Paternal Grandfather     ?  [place] Alzheimer's disease Father     died from PNA  . Diabetes Mother   . Stroke Mother 78   Social Hx:   Social History  Substance Use Topics  . Smoking status: Never Smoker   . Smokeless tobacco: Never Used  . Alcohol Use: No   Medication:   Current Outpatient Rx  Name  Route  Sig  Dispense  Refill  . acetaminophen (TYLENOL) 500 MG tablet   Oral   Take 1,000 mg by mouth every 6 (six) hours as needed for moderate pain.         [place] amLODipine (NORVASC) 10 MG tablet   Oral   Take 1 tablet (10 mg total) by mouth daily.   90 tablet   1     Note pt new shipping address;  [address]  [place]   . clonazePAM (KLONOPIN) 0.5 MG tablet      TAKE 1 TABLET DAILY AT BEDTIME   30 tablet   0     Not to exceed 5 additional fills before 11/07/2015   . flecainide (TAMBOCOR) 150 MG tablet      TAKE 1 TABLET BY MOUTH TWICE A DAY   60 tablet   3   . fluticasone (FLONASE) 50 MCG/ACT nasal spray      USE 2 SPARYS IN EACH NOSTRIL DAILY   16 g   6   . lansoprazole (PREVACID) 15 MG capsule   Oral   Take 1 capsule (15 mg total) by mouth daily.   90 capsule   1   . levothyroxine (SYNTHROID, LEVOTHROID) 25 MCG tablet   Oral   Take 1 tablet (25 mcg total) by mouth daily.   90 tablet   1     Note pt new shipping address;  [address]   [place]   . losartan (COZAAR) 50 MG tablet   Oral   Take 1 tablet (50 mg total) by mouth daily.   90 tablet  1     Note pt new shipping address;  [address]  [place]   . metoprolol tartrate (LOPRESSOR) 25 MG tablet   Oral   Take 1 tablet (25 mg total) by mouth 2 (two) times daily.   60 tablet   3   . simvastatin (ZOCOR) 20 MG tablet   Oral   Take 0.5 tablets (10 mg total) by mouth every evening.   45 tablet   1     Note pt new shipping address;  [address]  [place]   . triamcinolone cream (KENALOG) 0.1 %      APPLY TO AFFECTED AREA TOPICALLY TWICE DAILY Patient taking differently: APPLY TO AFFECTED AREA TOPICALLY AS NEEDED   120 g   0   . VITAMIN D, CHOLECALCIFEROL, PO   Oral   Take 500 mg by mouth daily.         [place] Kitchen warfarin (COUMADIN) 5 MG tablet   Oral   Take 1 tablet (5 mg total) by mouth as directed. Patient taking differently: Take 5 mg by mouth as directed. 5 mg daily and then one-half on Friday   90 tablet   1       Allergies:  Paxil; Clindamycin/lincomycin; Codeine; Doxycycline; Promethazine; Sulfa antibiotics; and Valium  Review of Systems: Gen:  Denies  fever, sweats, chills HEENT: Denies blurred vision, double vision.  Cvc:  No dizziness, chest pain. Resp:   Denies cough or sputum porduction, shortness of breath Gi: Denies swallowing difficulty, stomach pain. Gu:  Denies bladder incontinence, burning urine Ext:   No Joint pain, stiffness. Skin: No skin rash,  hives Endoc:  No polyuria, polydipsia. Psych: No depression, insomnia. Other:  All other systems were reviewed with the patient and were negative other that what is mentioned in the HPI.   Physical Examination:   VS: BP 128/68 mmHg  Pulse 58  Ht 5\' 7"  (1.702 m)  Wt 273 lb (123.832 kg)  BMI 42.75 kg/m2  SpO2 95%  General Appearance: No distress  Neuro:without focal findings,  speech normal, [place] 2.  HEENT: PERRLA, EOM intact.   Pulmonary: normal breath sounds, No  wheezing.  CardiovascularNormal S1,S2.  No m/r/g.   Abdomen: Benign, Soft, non-tender. Renal:  No costovertebral tenderness  GU:  No performed at this time. Endoc: No evident thyromegaly, no signs of acromegaly. Skin:   warm, no rashes, no ecchymosis  Extremities: normal, no cyanosis, clubbing.  Other findings:    LABORATORY PANEL:   CBC No results for input(s): WBC, HGB, HCT, PLT in the last 168 hours. ------------------------------------------------------------------------------------------------------------------  Chemistries  No results for input(s): NA, K, CL, CO2, GLUCOSE, BUN, CREATININE, CALCIUM, MG, AST, ALT, ALKPHOS, BILITOT in the last 168 hours.  Invalid input(s): GFRCGP ------------------------------------------------------------------------------------------------------------------  Cardiac Enzymes No results for input(s): TROPONINI in the last 168 hours. ------------------------------------------------------------  RADIOLOGY:  No results found.     Thank  you for the consultation and for allowing [facility] to assist in the care of your patient. Our recommendations are noted above.  Please contact us if we can be of further service.   Marda Stalker, MD.  Board Certified in Internal Medicine, Pulmonary Medicine, [place], and Sleep Medicine.  [facility] Office Number: [phone]  Patricia Pesa, M.D.  Vilinda Boehringer, M.D.  Merton Border, M.D

## 2015-06-23 NOTE — Patient Instructions (Addendum)
--  Sleep study.  Sleep Apnea Sleep apnea is disorder that affects a person's sleep. A person with sleep apnea has abnormal pauses in their breathing when they sleep. It is hard for them to get a good sleep. This makes a person tired during the day. It also can lead to other physical problems. There are three types of sleep apnea. One type is when breathing stops for a short time because your airway is blocked (obstructive sleep apnea). Another type is when the brain sometimes fails to give the normal signal to breathe to the muscles that control your breathing (central sleep apnea). The third type is a combination of the other two types. HOME CARE   Take all medicine as told by your doctor.  Avoid alcohol, calming medicines (sedatives), and depressant drugs.  Try to lose weight if you are overweight. Talk to your doctor about a healthy weight goal.  Your doctor may have you use a device that helps to open your airway. It can help you get the air that you need. It is called a positive airway pressure (PAP) device.   MAKE SURE YOU:   Understand these instructions.  Will watch your condition.  Will get help right away if you are not doing well or get worse.  It may take approximately 1 month for you to get used to wearing her CPAP every night.  Be sure to work with her CPAP as it takes some time to get used to it. If you take the CPAP off during the night. Be sure to put it back on.  To get the maximum benefit from a CPAP. He should wear it all night every night and whenever you sleep.   This information is not intended to replace advice given to you by your health care provider. Make sure you discuss any questions you have with your health care provider.   Document Released: 05/28/2008 Document Revised: 09/09/2014 Document Reviewed: 12/21/2011 Elsevier Interactive Patient Education [employer].

## 2015-06-23 NOTE — Addendum Note (Signed)
Addended by: Maryanna Shape A on: 06/23/2015 10:13 AM   Modules accepted: Orders

## 2015-07-10 ENCOUNTER — Other Ambulatory Visit: Payer: Self-pay | Admitting: Family Medicine

## 2015-07-10 NOTE — Telephone Encounter (Signed)
Rx called in as directed.   

## 2015-07-10 NOTE — Telephone Encounter (Signed)
plz phone in. 

## 2015-07-11 ENCOUNTER — Other Ambulatory Visit: Payer: Self-pay | Admitting: Cardiovascular Disease

## 2015-07-11 MED ORDER — METOPROLOL TARTRATE 25 MG PO TABS: 25.0000 mg | ORAL_TABLET | Freq: Two times a day (BID) | ORAL | Status: DC

## 2015-07-11 MED ORDER — FLECAINIDE ACETATE 150 MG PO TABS: 150.0000 mg | ORAL_TABLET | Freq: Two times a day (BID) | ORAL | Status: DC

## 2015-07-17 ENCOUNTER — Ambulatory Visit (INDEPENDENT_AMBULATORY_CARE_PROVIDER_SITE_OTHER): Payer: PPO | Admitting: *Deleted

## 2015-07-17 DIAGNOSIS — I4891 Unspecified atrial fibrillation: Secondary | ICD-10-CM

## 2015-07-17 DIAGNOSIS — Z5181 Encounter for therapeutic drug level monitoring: Secondary | ICD-10-CM | POA: Diagnosis not present

## 2015-07-17 LAB — POCT INR: INR: 2.8

## 2015-07-17 NOTE — Progress Notes (Signed)
Pre visit review using our clinic review tool, if applicable. No additional management support is needed unless otherwise documented below in the visit note. 

## 2015-07-18 ENCOUNTER — Other Ambulatory Visit: Payer: Self-pay

## 2015-07-18 MED ORDER — LANSOPRAZOLE 15 MG PO CPDR: 15.0000 mg | DELAYED_RELEASE_CAPSULE | Freq: Every day | ORAL | Status: DC

## 2015-07-18 NOTE — Telephone Encounter (Signed)
Pt left note requesting refill on [facility]raz[facility] to [facility] [place] [facility]; annual exam on 04/27/15; last refilled # 90 x 1 on 12/13/13. Advised refill done and pt voiced understanding.

## 2015-07-24 ENCOUNTER — Ambulatory Visit: Payer: PPO | Attending: Pulmonary Disease

## 2015-07-24 DIAGNOSIS — G4719 Other hypersomnia: Secondary | ICD-10-CM | POA: Diagnosis present

## 2015-07-24 DIAGNOSIS — I10 Essential (primary) hypertension: Secondary | ICD-10-CM | POA: Insufficient documentation

## 2015-07-24 DIAGNOSIS — G4733 Obstructive sleep apnea (adult) (pediatric): Secondary | ICD-10-CM | POA: Diagnosis not present

## 2015-07-24 DIAGNOSIS — K219 Gastro-esophageal reflux disease without esophagitis: Secondary | ICD-10-CM | POA: Diagnosis not present

## 2015-07-24 DIAGNOSIS — E039 Hypothyroidism, unspecified: Secondary | ICD-10-CM | POA: Insufficient documentation

## 2015-07-24 DIAGNOSIS — I4891 Unspecified atrial fibrillation: Secondary | ICD-10-CM | POA: Insufficient documentation

## 2015-07-24 DIAGNOSIS — E669 Obesity, unspecified: Secondary | ICD-10-CM | POA: Diagnosis not present

## 2015-07-25 DIAGNOSIS — R0902 Hypoxemia: Secondary | ICD-10-CM | POA: Diagnosis not present

## 2015-08-02 ENCOUNTER — Telehealth: Payer: Self-pay | Admitting: *Deleted

## 2015-08-02 DIAGNOSIS — G4730 Sleep apnea, unspecified: Secondary | ICD-10-CM

## 2015-08-02 NOTE — Telephone Encounter (Signed)
LM for pt to return call to inform her that we need to set her up for a HST per DR due to her not sleeping long enough in the sleep lab. Will await call.

## 2015-08-03 NOTE — Telephone Encounter (Signed)
Called and spoke with pt. Informed her of recs. Pt voiced understanding and had no further questions. HST order placed. Nothing further needed. Will sign off on message.

## 2015-08-09 ENCOUNTER — Other Ambulatory Visit: Payer: Self-pay | Admitting: Family Medicine

## 2015-08-09 DIAGNOSIS — G4733 Obstructive sleep apnea (adult) (pediatric): Secondary | ICD-10-CM | POA: Diagnosis not present

## 2015-08-09 NOTE — Telephone Encounter (Signed)
plz phone in. 

## 2015-08-09 NOTE — Telephone Encounter (Signed)
Rx called in as directed.   

## 2015-08-09 NOTE — Telephone Encounter (Signed)
Ok to refill 

## 2015-08-14 ENCOUNTER — Telehealth: Payer: Self-pay | Admitting: Family Medicine

## 2015-08-14 ENCOUNTER — Ambulatory Visit (INDEPENDENT_AMBULATORY_CARE_PROVIDER_SITE_OTHER): Payer: PPO | Admitting: *Deleted

## 2015-08-14 DIAGNOSIS — Z5181 Encounter for therapeutic drug level monitoring: Secondary | ICD-10-CM

## 2015-08-14 DIAGNOSIS — I4891 Unspecified atrial fibrillation: Secondary | ICD-10-CM | POA: Diagnosis not present

## 2015-08-14 LAB — POCT INR: INR: 2.4

## 2015-08-14 NOTE — Telephone Encounter (Signed)
Pt dropped off a Renew[employer] Disability Parking Placard form to be filled out and signed. Best number for pt is [phone].  Placing ppw in Dr. Bosie Clos rx tower slot.

## 2015-08-14 NOTE — Telephone Encounter (Signed)
Filled and in Kim's box. 

## 2015-08-14 NOTE — Telephone Encounter (Signed)
Form is in your in box

## 2015-08-14 NOTE — Progress Notes (Signed)
Pre visit review using our clinic review tool, if applicable. No additional management support is needed unless otherwise documented below in the visit note. 

## 2015-08-15 NOTE — Telephone Encounter (Signed)
Left detailed message on voicemail and placed form at front desk for pickup.

## 2015-08-20 DIAGNOSIS — G4733 Obstructive sleep apnea (adult) (pediatric): Secondary | ICD-10-CM | POA: Diagnosis not present

## 2015-08-21 ENCOUNTER — Other Ambulatory Visit: Payer: Self-pay | Admitting: *Deleted

## 2015-08-21 DIAGNOSIS — G4730 Sleep apnea, unspecified: Secondary | ICD-10-CM

## 2015-08-29 ENCOUNTER — Other Ambulatory Visit: Payer: Self-pay | Admitting: Internal Medicine

## 2015-08-29 DIAGNOSIS — G4733 Obstructive sleep apnea (adult) (pediatric): Secondary | ICD-10-CM

## 2015-08-30 ENCOUNTER — Encounter: Payer: Self-pay | Admitting: Internal Medicine

## 2015-08-30 ENCOUNTER — Ambulatory Visit: Payer: PPO | Attending: Pulmonary Disease

## 2015-08-30 DIAGNOSIS — G4733 Obstructive sleep apnea (adult) (pediatric): Secondary | ICD-10-CM | POA: Diagnosis present

## 2015-09-05 DIAGNOSIS — G4733 Obstructive sleep apnea (adult) (pediatric): Secondary | ICD-10-CM | POA: Diagnosis not present

## 2015-09-07 ENCOUNTER — Telehealth: Payer: Self-pay | Admitting: *Deleted

## 2015-09-07 DIAGNOSIS — G4733 Obstructive sleep apnea (adult) (pediatric): Secondary | ICD-10-CM

## 2015-09-07 NOTE — Telephone Encounter (Signed)
Order placed for CPAP. Pt aware.

## 2015-09-11 ENCOUNTER — Other Ambulatory Visit: Payer: Self-pay | Admitting: Family Medicine

## 2015-09-11 ENCOUNTER — Ambulatory Visit: Payer: Self-pay | Admitting: Cardiovascular Disease

## 2015-09-11 NOTE — Telephone Encounter (Signed)
Rx called in as directed.   

## 2015-09-11 NOTE — Telephone Encounter (Signed)
plz phone in. 

## 2015-09-12 ENCOUNTER — Ambulatory Visit: Payer: Self-pay | Admitting: Internal Medicine

## 2015-09-12 ENCOUNTER — Other Ambulatory Visit: Payer: Self-pay

## 2015-09-18 ENCOUNTER — Telehealth: Payer: Self-pay

## 2015-09-18 ENCOUNTER — Telehealth: Payer: Self-pay | Admitting: Family Medicine

## 2015-09-18 ENCOUNTER — Ambulatory Visit (INDEPENDENT_AMBULATORY_CARE_PROVIDER_SITE_OTHER): Payer: PPO | Admitting: *Deleted

## 2015-09-18 ENCOUNTER — Ambulatory Visit: Payer: Self-pay | Admitting: Family Medicine

## 2015-09-18 DIAGNOSIS — I4891 Unspecified atrial fibrillation: Secondary | ICD-10-CM

## 2015-09-18 DIAGNOSIS — Z5181 Encounter for therapeutic drug level monitoring: Secondary | ICD-10-CM

## 2015-09-18 LAB — POCT INR: INR: 2.3

## 2015-09-18 NOTE — Progress Notes (Signed)
Pre visit review using our clinic review tool, if applicable. No additional management support is needed unless otherwise documented below in the visit note. 

## 2015-09-18 NOTE — Telephone Encounter (Signed)
pt not on my schedule - can we check with rena or pt about this?

## 2015-09-18 NOTE — Telephone Encounter (Signed)
error 

## 2015-09-18 NOTE — Telephone Encounter (Signed)
Pt called [facility] yesterday and they said that pt needs to speak with Dr Darnell Level while she is her for PT INR. Pt is here with Barnett Applebaum and would like to speak to Dr Darnell Level about dizziness.  Thank you

## 2015-09-18 NOTE — Telephone Encounter (Signed)
Patient did not want to wait to be seen or come back this afternoon since she lives in [place].  I offered an appointment for tomorrow morning but she declined and stated she was feeling a little better and she would call back to schedule if needed.  She denies chest pain and dizziness at this time.

## 2015-09-18 NOTE — Telephone Encounter (Signed)
PLEASE NOTE: All timestamps contained within this report are represented as [place] Standard Time. CONFIDENTIALTY NOTICE: This fax transmission is intended only for the addressee. It contains information that is legally privileged, confidential or otherwise protected from use or disclosure. If you are not the intended recipient, you are strictly prohibited from reviewing, disclosing, copying using or disseminating any of this information or taking any action in reliance on or regarding this information. If you have received this fax in error, please notify us immediately by telephone so that we can arrange for its return to [place]. Phone: [phone], Toll-Free: [phone], Fax: [phone] Page: 1 of 2 Call Id: [id number] [place] Patient Name: Mckenzie Frank Gender: Female DOB: 13-Feb-1941 Age: 75 Y 43 M 6 D Return Phone Number: [phone] (Primary), [phone] (Secondary) Address: City/State/Zip: [place] Client [place] [facility] - Client Client Site [place] Physician Ria Bush Contact Type Call Call Type Triage / Clinical Relationship To Patient Self Return Phone Number [phone] (Primary) Chief Complaint Dizziness Initial Comment Caller states c/o dizziness, blood pressure is 119/56, HR 67, just doesn't feel like herself PreDisposition Go to Urgent Care/Walk-In Clinic Nurse Assessment Nurse: Mills-Hernandez, RN, Izora Gala Date/Time (Eastern Time): 09/17/2015 11:51:38 AM Confirm and document reason for call. If symptomatic, describe symptoms. You must click the next button to save text entered. ---Caller states she feels lightheaded and dizzy. Has been feeling dizzy for past 3 days. Has the patient traveled out of the country within the last 30 days? ---No Does the patient have any new or worsening symptoms? ---Yes Will a  triage be completed? ---Yes Related visit to physician within the last 2 weeks? ---No Does the PT have any chronic conditions? (i.e. diabetes, asthma, etc.) ---Yes List chronic conditions. ---HTN, AFIB Is this a behavioral health or substance abuse call? ---No Guidelines Guideline Title Affirmed Question Affirmed Notes Nurse Date/Time (Eastern Time) Dizziness - Lightheadedness [1] MODERATE dizziness (e.g., interferes with normal activities) AND [2] has NOT been evaluated by physician for this (Exception: dizziness caused by heat exposure, sudden standing, or poor fluid intake) Mills-Hernandez, RN, Izora Gala 09/17/2015 11:54:00 AM Disp. Time Eilene Ghazi Time) Disposition Final User 09/17/2015 11:28:38 AM Send To Clinical Follow Up Rich Brave, Amy 09/17/2015 11:59:19 AM See Physician within 24 Hours Yes Mills-Hernandez, RN, Junie Panning NOTE: All timestamps contained within this report are represented as [place] Standard Time. CONFIDENTIALTY NOTICE: This fax transmission is intended only for the addressee. It contains information that is legally privileged, confidential or otherwise protected from use or disclosure. If you are not the intended recipient, you are strictly prohibited from reviewing, disclosing, copying using or disseminating any of this information or taking any action in reliance on or regarding this information. If you have received this fax in error, please notify us immediately by telephone so that we can arrange for its return to [place]. Phone: [phone], Toll-Free: [phone], Fax: [phone] Page: 2 of 2 Call Id: [id number] Caller Understands: Yes Disagree/Comply: Comply Care Advice Given Per Guideline SEE PHYSICIAN WITHIN 24 HOURS: * IF OFFICE WILL BE OPEN: You need to be seen within the next 24 hours. Call your doctor when the office opens, and make an appointment. * IF PATIENT HAS NO PCP: Refer patient to an Urgent [facility] or [facility]. Also try to help  caller find a PCP (medical home) for their future care. CALL BACK IF: * Passes out (faints) *  You become worse. CARE ADVICE given per Dizziness (Adult) guideline. FLUIDS: Drink several glasses of fruit juice, other clear fluids or water. This will improve hydration and blood glucose. If the weather is hot, make sure the fluids are cold. REST: Lie down with feet elevated for 1 hour. This will improve circulation and increase blood flow to the brain. After Care Instructions Given Call Event Type User Date / Time Description Referrals REFERRED TO PCP OFFICE

## 2015-09-18 NOTE — Telephone Encounter (Signed)
Pt is here for INR appt; pt is not dizzy now, dizziness comes and goes for 3 days. This morning BP 167/90. No H/A, CP or SOB. Pt does not want to see any other provider except Dr Darnell Level. Pt scheduled to see Dr Darnell Level today at 3:30 PM. Pt voiced understanding.

## 2015-09-20 DIAGNOSIS — S8390XA Sprain of unspecified site of unspecified knee, initial encounter: Secondary | ICD-10-CM | POA: Diagnosis not present

## 2015-09-20 DIAGNOSIS — G4733 Obstructive sleep apnea (adult) (pediatric): Secondary | ICD-10-CM | POA: Diagnosis not present

## 2015-09-28 ENCOUNTER — Encounter: Payer: Self-pay | Admitting: Internal Medicine

## 2015-10-07 ENCOUNTER — Other Ambulatory Visit: Payer: Self-pay | Admitting: Family Medicine

## 2015-10-09 ENCOUNTER — Other Ambulatory Visit: Payer: Self-pay | Admitting: Family Medicine

## 2015-10-09 NOTE — Telephone Encounter (Signed)
Rx called in as directed.   

## 2015-10-09 NOTE — Telephone Encounter (Signed)
plz phone in. 

## 2015-10-11 ENCOUNTER — Other Ambulatory Visit: Payer: Self-pay

## 2015-10-11 MED ORDER — AMLODIPINE BESYLATE 10 MG PO TABS: 10.0000 mg | ORAL_TABLET | Freq: Every day | ORAL | Status: DC

## 2015-10-11 MED ORDER — LOSARTAN POTASSIUM 50 MG PO TABS: 50.0000 mg | ORAL_TABLET | Freq: Every day | ORAL | Status: DC

## 2015-10-11 NOTE — Telephone Encounter (Signed)
Pt  Request refill amlodipine and losartan to [facility]. Last annual 04/27/15. Pt notified refill done; per protocol.

## 2015-10-16 ENCOUNTER — Ambulatory Visit (INDEPENDENT_AMBULATORY_CARE_PROVIDER_SITE_OTHER): Payer: PPO | Admitting: *Deleted

## 2015-10-16 DIAGNOSIS — Z5181 Encounter for therapeutic drug level monitoring: Secondary | ICD-10-CM

## 2015-10-16 DIAGNOSIS — I4891 Unspecified atrial fibrillation: Secondary | ICD-10-CM

## 2015-10-16 LAB — POCT INR: INR: 2.6

## 2015-10-16 NOTE — Progress Notes (Signed)
Pre visit review using our clinic review tool, if applicable. No additional management support is needed unless otherwise documented below in the visit note. 

## 2015-10-16 NOTE — Progress Notes (Signed)
INR remains therapeutic.  Patient states she has been having intermittent bloody drainage from the right breast x 2 weeks.  This is an issue that also occurred about 6 months ago.  Appointment scheduled with PCP for evaluation in 1 week (per patient's request.  Patient to call with new or worsening symptoms prior to.  No changes in therapy at this time.

## 2015-10-20 ENCOUNTER — Ambulatory Visit (INDEPENDENT_AMBULATORY_CARE_PROVIDER_SITE_OTHER): Payer: PPO | Admitting: Cardiovascular Disease

## 2015-10-20 ENCOUNTER — Encounter: Payer: Self-pay | Admitting: Cardiovascular Disease

## 2015-10-20 VITALS — BP 171/74 | HR 59 | Ht 67.0 in | Wt 276.0 lb

## 2015-10-20 DIAGNOSIS — G478 Other sleep disorders: Secondary | ICD-10-CM | POA: Diagnosis not present

## 2015-10-20 DIAGNOSIS — I4819 Other persistent atrial fibrillation: Secondary | ICD-10-CM

## 2015-10-20 DIAGNOSIS — I481 Persistent atrial fibrillation: Secondary | ICD-10-CM

## 2015-10-20 DIAGNOSIS — I10 Essential (primary) hypertension: Secondary | ICD-10-CM | POA: Diagnosis not present

## 2015-10-20 DIAGNOSIS — G4730 Sleep apnea, unspecified: Secondary | ICD-10-CM

## 2015-10-20 NOTE — Assessment & Plan Note (Signed)
She is maintaining in sinus rhythm with high dose flecainide. Continue long-term anticoagulation with a target INR between 2 and 3.

## 2015-10-20 NOTE — Patient Instructions (Signed)
Medication Instructions: Continue same medications.   Labwork: None.   Procedures/Testing: None.   Follow-Up: 6 months follow up with Dr. Arida.   Any Additional Special Instructions Will Be Listed Below (If Applicable).     If you need a refill on your cardiac medications before your next appointment, please call your pharmacy.   

## 2015-10-20 NOTE — Assessment & Plan Note (Signed)
Symptoms improved with using CPAP. However, she reports inability to sleep more than 3 hours at that time. I asked her to address this with Dr. Danise Mina and Dr. Ashby Dawes.

## 2015-10-20 NOTE — Assessment & Plan Note (Signed)
Blood pressure is elevated today but this is somewhat unusual for her. I made no changes today. Continue to monitor.

## 2015-10-20 NOTE — Progress Notes (Signed)
Primary care physician: Dr. Danise Mina  HPI  This is a 75 year old female who is here today for a follow up visit regarding persistent atrial fibrillation.  She has known history of persistent atrial fibrillation diagnosed in 2010.  She reports having a cardiac catheterization many years ago by Dr. Clayborn Bigness which showed no significant coronary artery disease.  She was hospitalized in January, 2015 with diverticulitis. She developed rectus sheath hematoma from Lovenox injection which was given as bridging. She had multiple complications related to that and required nephrostomy tubes.  She had cardioversion done in February, 2016 for recurrent atrial fibrillation. She maintained in sinus rhythm until recently when she was found to have symptomatic atrial fibrillation. I increased the dose of flecainide 150 mg twice daily and proceeded with repeat cardioversion in September 2016. Most recent echocardiogram in October 2016 showed normal LV systolic function, mild-to-moderate mitral regurgitation, moderately dilated left atrium and no evidence of pulmonary hypertension. She was diagnosed with sleep apnea and currently using the CPAP. Nonetheless, she continues to complain of inability to sleep more than 2 hours in spite of being given clonazepam. No recurrent atrial fibrillation. She denies chest pain or significant dyspnea. She is reporting bloody secretions from the right breast and she is going to follow-up with Dr. Danise Mina. She reports negative workup at the [facility] last year.  Allergies  Allergen Reactions  . Paxil [Paroxetine Hcl] Other (See Comments)    Shaking   . Clindamycin/Lincomycin Other (See Comments)    unknown  . Codeine Other (See Comments)    unknown  . Doxycycline Other (See Comments)    unknown  . Promethazine Other (See Comments)    unknown  . Sulfa Antibiotics Hives  . Valium Other (See Comments)    unknown     Current Outpatient Prescriptions on File Prior to  Visit  Medication Sig Dispense Refill  . acetaminophen (TYLENOL) 500 MG tablet Take 1,000 mg by mouth every 6 (six) hours as needed for moderate pain.    Marland Kitchen amLODipine (NORVASC) 10 MG tablet Take 1 tablet (10 mg total) by mouth daily. 90 tablet 1  . clonazePAM (KLONOPIN) 0.5 MG tablet TAKE 1 TABLET BY MOUTH AT BEDTIME 30 tablet 0  . flecainide (TAMBOCOR) 150 MG tablet Take 1 tablet (150 mg total) by mouth 2 (two) times daily. 180 tablet 3  . fluticasone (FLONASE) 50 MCG/ACT nasal spray USE 2 SPARYS IN EACH NOSTRIL DAILY 16 g 6  . lansoprazole (PREVACID) 15 MG capsule Take 1 capsule (15 mg total) by mouth daily. 90 capsule 1  . levothyroxine (SYNTHROID, LEVOTHROID) 25 MCG tablet Take 1 tablet (25 mcg total) by mouth daily. 90 tablet 1  . losartan (COZAAR) 50 MG tablet Take 1 tablet (50 mg total) by mouth daily. 90 tablet 1  . metoprolol tartrate (LOPRESSOR) 25 MG tablet Take 1 tablet (25 mg total) by mouth 2 (two) times daily. 180 tablet 3  . simvastatin (ZOCOR) 20 MG tablet Take 0.5 tablets (10 mg total) by mouth every evening. 45 tablet 1  . triamcinolone cream (KENALOG) 0.1 % APPLY TO AFFECTED AREA TOPICALLY TWICE DAILY (Patient taking differently: APPLY TO AFFECTED AREA TOPICALLY AS NEEDED) 120 g 0  . VITAMIN D, CHOLECALCIFEROL, PO Take 500 mg by mouth daily.    Marland Kitchen warfarin (COUMADIN) 5 MG tablet Take 1 tablet (5 mg total) by mouth as directed. (Patient taking differently: Take 5 mg by mouth as directed. 5 mg daily and then one-half on Friday) 90 tablet  1   No current facility-administered medications on file prior to visit.     Past Medical History  Diagnosis Date  . Hyperlipidemia   . Hypertension   . Hypothyroidism     borderline  . Osteoarthritis of knee     s/p replacement  . Abnormal mammogram     left medial breast - benign fat necrosis with residual calcification  . Benign head tremor   . Atrial fibrillation ([place]) 2010    s/p DC cardioversion. on coumadin  . Insomnia   .  GERD (gastroesophageal reflux disease)   . Chronic bronchitis ([place])     "used to get it alot; not so much anymore" (08/27/2013)  . Sleep apnea     "does not wear mask" (08/27/2013)  . Rectus sheath hematoma 08/27/2013    R after bridging with lovenox  . H/O hiatal hernia     moderate sized/notes  . History of diverticulitis of colon 08/2013    sigmoid with possible microperf/abscess s/p hospitalization complicated by rectus sheath hematoma during lovenox bridge, leading to ARF from hematoma compression on kidney  . RSV bronchitis 09/13/2013     Past Surgical History  Procedure Laterality Date  . Replacement total knee Bilateral 2005  . Appendectomy  1947  . Dilation and curettage of uterus    . Cholecystectomy  1980  . Cardiac catheterization      [facility];Dr. Clayborn Bigness  . Dexa  10/2012    WNL  . Tonsillectomy    . Joint replacement    . Abdominal exploration surgery  1971    "to see if I needed hysterectomy; dr thought qthing looked too good so he didn't do a hysterectomy at this time" (08/27/2013)  . Vaginal hysterectomy  1971    "2 wks after 1st dr said it was ok" (08/27/2013)  . Cardioversion      [facility] 08/27/2013  . Percutaneous nephrostomy  08/2013    ARF from hematoma compression  . Electrophysiologic study N/A 05/25/2015    Procedure: Cardioversion;  Surgeon: Wellington Hampshire, MD;  Location: [facility] ORS;  Service: Cardiovascular;  Laterality: N/A;     Family History  Problem Relation Age of Onset  . CAD Mother     angina  . Cancer Paternal Grandfather     ?  Marland Kitchen Alzheimer's disease Father     died from PNA  . Diabetes Mother   . Stroke Mother 21     Social History   Social History  . Marital Status: Widowed    Spouse Name: N/A  . Number of Children: N/A  . Years of Education: N/A   Occupational History  . Retired    Social History Main Topics  . Smoking status: Never Smoker   . Smokeless tobacco: Never Used  . Alcohol Use: No  . Drug Use: No  . Sexual  Activity: No   Other Topics Concern  . Not on file   Social History Narrative   Caffeine: 1 cup coffee/day   Lives alone in senior living.  Daughter in [place]   Occupation: retired, Scientist, research (medical)   Edu: HS   Activity: walks the track and mall daily   Diet: good water, fruits/vegetables daily     PHYSICAL EXAM   BP 171/74 mmHg  Pulse 59  Ht 5\' 7"  (1.702 m)  Wt 276 lb (125.193 kg)  BMI 43.22 kg/m2 Constitutional: She is oriented to person, place, and time. She appears well-developed and well-nourished. No distress.  HENT: No nasal discharge.  Head: Normocephalic and atraumatic.  Eyes: Pupils are equal and round. Right eye exhibits no discharge. Left eye exhibits no discharge.  Neck: Normal range of motion. Neck supple. No JVD present. No thyromegaly present.  Cardiovascular: Normal rate, regular rhythm, normal heart sounds. Exam reveals no gallop and no friction rub. No murmur heard.  Pulmonary/Chest: Effort normal and breath sounds normal. No stridor. No respiratory distress. She has no wheezes. She has no rales. She exhibits no tenderness.  Abdominal: Soft. Bowel sounds are normal. She exhibits no distension. There is no tenderness. There is no rebound and no guarding.  Musculoskeletal: Normal range of motion. She exhibits no edema and no tenderness.  Neurological: She is alert and oriented to person, place, and time. Coordination normal.  Skin: Skin is warm and dry. No rash noted. She is not diaphoretic. No erythema. No pallor.  Psychiatric: She has a normal mood and affect. Her behavior is normal. Judgment and thought content normal.     EKG: Normal sinus rhythm with left axis deviation. LVH with QRS widening.     ASSESSMENT AND PLAN

## 2015-10-21 DIAGNOSIS — S8390XA Sprain of unspecified site of unspecified knee, initial encounter: Secondary | ICD-10-CM | POA: Diagnosis not present

## 2015-10-21 DIAGNOSIS — G4733 Obstructive sleep apnea (adult) (pediatric): Secondary | ICD-10-CM | POA: Diagnosis not present

## 2015-10-24 ENCOUNTER — Encounter: Payer: Self-pay | Admitting: Family Medicine

## 2015-10-24 ENCOUNTER — Ambulatory Visit (INDEPENDENT_AMBULATORY_CARE_PROVIDER_SITE_OTHER): Payer: PPO | Admitting: Family Medicine

## 2015-10-24 VITALS — BP 140/80 | HR 60 | Temp 97.6°F | Wt 275.2 lb

## 2015-10-24 DIAGNOSIS — I481 Persistent atrial fibrillation: Secondary | ICD-10-CM | POA: Diagnosis not present

## 2015-10-24 DIAGNOSIS — G4700 Insomnia, unspecified: Secondary | ICD-10-CM | POA: Diagnosis not present

## 2015-10-24 DIAGNOSIS — N6452 Nipple discharge: Secondary | ICD-10-CM

## 2015-10-24 DIAGNOSIS — R251 Tremor, unspecified: Secondary | ICD-10-CM | POA: Diagnosis not present

## 2015-10-24 DIAGNOSIS — I4819 Other persistent atrial fibrillation: Secondary | ICD-10-CM

## 2015-10-24 MED ORDER — TRAZODONE HCL 50 MG PO TABS: 25.0000 mg | ORAL_TABLET | Freq: Every day | ORAL | Status: DC

## 2015-10-24 NOTE — Progress Notes (Signed)
BP 140/80 mmHg  Pulse 60  Temp(Src) 97.6 F (36.4 C) (Oral)  Wt 275 lb 4 oz (124.853 kg)   CC: 6 mo f/u visit, R bleeding breast nipple   Subjective:    Patient ID: Linna Hoff, female    DOB: [birth year], 75 y.o.   MRN: [id number]  HPI: JENAVI SCHLOTFELDT is a 75 y.o. 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 At that time, unable to perform ductogram because discharge had resolved. Advised f/u if recurrent sxs to repeat ductogram (Breast center). 2 wks ago R nipple started bleeding. Notices mild discharge in morning on gown and bra. Requests to go to [facility].   Dizzy episode last month that resolved.   Atrial fibrillation - on coumadin and flecainide. rec against any bridging due to retroperitoneal bleed while on lovenox.   Worsening insomnia - despite klonopin 0.5mg  nightly.   Head tremor present for years. Over last 2-3 months noticing worsening hand tremors especially noted when trying to sign checks. Denies memory trouble, unsteadiness on feet, urinary incontinence. No endorsed personality or behavior changes. Denies depression/anxiety. No masked fascies, no anosmia, denies extremity stiffness. No arthralgias or myalgias.   Relevant past medical, surgical, family and social history reviewed and updated as indicated. Interim medical history since our last visit reviewed. Allergies and medications reviewed and updated. Current Outpatient Prescriptions on File Prior to Visit  Medication Sig  . acetaminophen (TYLENOL) 500 MG tablet Take 1,000 mg by mouth every 6 (six) hours as needed for moderate pain.  Marland Kitchen amLODipine (NORVASC) 10 MG tablet Take 1 tablet (10 mg total) by mouth daily.  . flecainide (TAMBOCOR) 150 MG tablet Take 1 tablet (150 mg total) by mouth 2 (two) times daily.  . fluticasone (FLONASE) 50 MCG/ACT nasal spray USE 2 SPARYS IN EACH NOSTRIL DAILY  . lansoprazole  (PREVACID) 15 MG capsule Take 1 capsule (15 mg total) by mouth daily.  Marland Kitchen levothyroxine (SYNTHROID, LEVOTHROID) 25 MCG tablet Take 1 tablet (25 mcg total) by mouth daily.  Marland Kitchen losartan (COZAAR) 50 MG tablet Take 1 tablet (50 mg total) by mouth daily.  . metoprolol tartrate (LOPRESSOR) 25 MG tablet Take 1 tablet (25 mg total) by mouth 2 (two) times daily.  . simvastatin (ZOCOR) 20 MG tablet Take 0.5 tablets (10 mg total) by mouth every evening.  . triamcinolone cream (KENALOG) 0.1 % APPLY TO AFFECTED AREA TOPICALLY TWICE DAILY (Patient taking differently: APPLY TO AFFECTED AREA TOPICALLY AS NEEDED)  . VITAMIN D, CHOLECALCIFEROL, PO Take 500 mg by mouth daily.  Marland Kitchen warfarin (COUMADIN) 5 MG tablet Take 1 tablet (5 mg total) by mouth as directed. (Patient taking differently: Take 5 mg by mouth as directed. 5 mg daily and then one-half on Friday)   No current facility-administered medications on file prior to visit.    Review of Systems Per HPI unless specifically indicated in ROS section     Objective:    BP 140/80 mmHg  Pulse 60  Temp(Src) 97.6 F (36.4 C) (Oral)  Wt 275 lb 4 oz (124.853 kg)  Wt Readings from Last 3 Encounters:  10/24/15 275 lb 4 oz (124.853 kg)  10/20/15 276 lb (125.193 kg)  06/23/15 273 lb (123.832 kg)    Physical Exam  Constitutional: She appears well-developed and well-nourished. No distress.  HENT:  Mouth/Throat: Oropharynx is clear and moist. No oropharyngeal exudate.  Cardiovascular: Normal rate, regular rhythm and  intact distal pulses.   Murmur (2/6 SEM) heard. Pulmonary/Chest: Effort normal and breath sounds normal. No respiratory distress. She has no wheezes. She has no rales. Right breast exhibits nipple discharge. Right breast exhibits no mass, no skin change and no tenderness. Left breast exhibits no mass, no nipple discharge, no skin change and no tenderness.  Dry blood present on R nipple Unable to express discharge from R nipple  Musculoskeletal: She  exhibits no edema.  Lymphadenopathy:    She has no axillary adenopathy.       Right axillary: No lateral adenopathy present.       Left axillary: No lateral adenopathy present.      Right: No supraclavicular adenopathy present.       Left: No supraclavicular adenopathy present.  Neurological:  Head titubations present at rest BUE tremor present noted with extension of arms  Nursing note and vitals reviewed.  Results for orders placed or performed in visit on 10/16/15  POCT INR  Result Value Ref Range   INR 2.6       Assessment & Plan:   Problem List Items Addressed This Visit    Tremor    Titubations present since at least 2013. Now over last 2-3 months endorses R>L UE tremors worse with signing checks. No other obvious parkinson symptoms or signs. Will refer to neurology for eval of worsening tremor and recs on management.       Relevant Orders   Ambulatory referral to Neurology   Persistent atrial fibrillation ([place])    Continue coumadin at our clinic and flecainide followed by cardiology       Insomnia    Longstanding issue. Klonopin losing effectiveness. Will decrease to 1/2 tab nightly, add trazodone.       Bloody discharge from right nipple - Primary    Reviewed prior eval 04/2015. Unable to complete ductogram. Will update mammo/US and re order ductogram. If unsuccessful, will refer to surgery for further evaluation. Pt agrees.      Relevant Orders   MM Ductogram Unilateral Right   MM Digital Diagnostic Bilat   US BREAST LTD UNI LEFT INC AXILLA   US BREAST LTD UNI RIGHT INC AXILLA       Follow up plan: Return in about 6 months (around 04/22/2016), or as needed, for medicare wellness visit.

## 2015-10-24 NOTE — Patient Instructions (Addendum)
Try 1/2 - 1 tablet klonopin nightly Start trazodone 25-50mg  nightly for sleep Pass by marion to schedule ductogram at [facility]. We will refer you to neurology for further evaluation of your tremors.

## 2015-10-24 NOTE — Assessment & Plan Note (Signed)
Longstanding issue. Klonopin losing effectiveness. Will decrease to 1/2 tab nightly, add trazodone.

## 2015-10-24 NOTE — Assessment & Plan Note (Signed)
Continue coumadin at our clinic and flecainide followed by cardiology

## 2015-10-24 NOTE — Assessment & Plan Note (Signed)
Reviewed prior eval 04/2015. Unable to complete ductogram. Will update mammo/US and re order ductogram. If unsuccessful, will refer to surgery for further evaluation. Pt agrees.

## 2015-10-24 NOTE — Assessment & Plan Note (Addendum)
Titubations present since at least 2013. Now over last 2-3 months endorses R>L UE tremors worse with signing checks. No other obvious parkinson symptoms or signs. Will refer to neurology for eval of worsening tremor and recs on management.

## 2015-10-24 NOTE — Progress Notes (Signed)
Pre visit review using our clinic review tool, if applicable. No additional management support is needed unless otherwise documented below in the visit note. 

## 2015-10-26 ENCOUNTER — Ambulatory Visit
Admission: RE | Admit: 2015-10-26 | Discharge: 2015-10-26 | Disposition: A | Discharge status: Home / Self Care | Payer: PPO | Source: Ambulatory Visit | Attending: Family Medicine | Admitting: Family Medicine

## 2015-10-26 ENCOUNTER — Other Ambulatory Visit: Payer: Self-pay | Admitting: Family Medicine

## 2015-10-26 DIAGNOSIS — N6452 Nipple discharge: Secondary | ICD-10-CM

## 2015-10-30 ENCOUNTER — Other Ambulatory Visit: Payer: Self-pay | Admitting: General Surgery

## 2015-10-30 DIAGNOSIS — G250 Essential tremor: Secondary | ICD-10-CM | POA: Diagnosis not present

## 2015-10-30 DIAGNOSIS — N6452 Nipple discharge: Secondary | ICD-10-CM | POA: Diagnosis not present

## 2015-10-31 ENCOUNTER — Encounter: Payer: Self-pay | Admitting: *Deleted

## 2015-11-03 ENCOUNTER — Ambulatory Visit (INDEPENDENT_AMBULATORY_CARE_PROVIDER_SITE_OTHER): Payer: PPO | Admitting: Neurology

## 2015-11-03 ENCOUNTER — Encounter: Payer: Self-pay | Admitting: Neurology

## 2015-11-03 VITALS — BP 152/100 | HR 56 | Ht 67.0 in | Wt 277.0 lb

## 2015-11-03 DIAGNOSIS — G243 Spasmodic torticollis: Secondary | ICD-10-CM

## 2015-11-03 DIAGNOSIS — G250 Essential tremor: Secondary | ICD-10-CM

## 2015-11-03 NOTE — Progress Notes (Signed)
Subjective:   Mckenzie Frank was seen in consultation in the movement disorder clinic at the request of Ria Bush, MD.  The evaluation is for tremor.  Tremor is located in both the hands and head.  Pt reports that tremor started 1.5-2 years ago in her head and she would notice her head would "twitch, " and not necesssarily tremor.   However, she noticed that when she started klonopin for sleep, it seemed to help the head tremor.  She reports that hand tremor started a few years ago as well.  She has some tremor in the right more than left foot as well.  She is right handed.  She has trouble writing and filling out paperwork.  She has no family hx of tremor.  Affected by caffeine: doesn't think so (1-2 cups coffee/day; rare soda; no tea) Affected by alcohol: doesn't drink alcohol Affected by stress:  No. Affected by fatigue:  No. Spills soup if on spoon:  Yes.   Spills glass of liquid if full:  Yes.   Affects ADL's (tying shoes, brushing teeth, etc):  No. (but may have trouble with lining makeup on lips)  Current/Previously tried tremor medications: no but on klonopin for sleep and it helps; on lopressor for BP  Current medications that may exacerbate tremor:  n/a  Outside reports reviewed: lab reports, office notes and referral letter/letters.  Allergies  Allergen Reactions  . Paxil [Paroxetine Hcl] Other (See Comments)    Shaking   . Clindamycin/Lincomycin Other (See Comments)    unknown  . Codeine Other (See Comments)    unknown  . Doxycycline Other (See Comments)    unknown  . Promethazine Other (See Comments)    unknown  . Sulfa Antibiotics Hives  . Valium Other (See Comments)    unknown    Outpatient Encounter Prescriptions as of 11/03/2015  Medication Sig  . acetaminophen (TYLENOL) 500 MG tablet Take 1,000 mg by mouth every 6 (six) hours as needed for moderate pain.  Marland Kitchen amLODipine (NORVASC) 10 MG tablet Take 1 tablet (10 mg total) by mouth daily.  . clonazePAM  (KLONOPIN) 0.5 MG tablet Take 0.25 mg by mouth at bedtime.   . flecainide (TAMBOCOR) 150 MG tablet Take 1 tablet (150 mg total) by mouth 2 (two) times daily.  . furosemide (LASIX) 40 MG tablet Take 40 mg by mouth 2 (two) times daily.  . lansoprazole (PREVACID) 15 MG capsule Take 1 capsule (15 mg total) by mouth daily.  Marland Kitchen levothyroxine (SYNTHROID, LEVOTHROID) 25 MCG tablet Take 1 tablet (25 mcg total) by mouth daily.  Marland Kitchen losartan (COZAAR) 50 MG tablet Take 1 tablet (50 mg total) by mouth daily.  . metoprolol tartrate (LOPRESSOR) 25 MG tablet Take 1 tablet (25 mg total) by mouth 2 (two) times daily.  . simvastatin (ZOCOR) 20 MG tablet Take 0.5 tablets (10 mg total) by mouth every evening.  . traZODone (DESYREL) 50 MG tablet Take 0.5-1 tablets (25-50 mg total) by mouth at bedtime.  Marland Kitchen VITAMIN D, CHOLECALCIFEROL, PO Take 500 mg by mouth daily.  Marland Kitchen warfarin (COUMADIN) 5 MG tablet Take 1 tablet (5 mg total) by mouth as directed. (Patient taking differently: Take 5 mg by mouth as directed. 5 mg daily and then one-half on Friday)  . [DISCONTINUED] fluticasone (FLONASE) 50 MCG/ACT nasal spray USE 2 SPARYS IN EACH NOSTRIL DAILY  . [DISCONTINUED] triamcinolone cream (KENALOG) 0.1 % APPLY TO AFFECTED AREA TOPICALLY TWICE DAILY (Patient taking differently: APPLY TO AFFECTED AREA TOPICALLY AS  NEEDED)   No facility-administered encounter medications on file as of 11/03/2015.    Past Medical History  Diagnosis Date  . Hyperlipidemia   . Hypertension   . Hypothyroidism     borderline  . Osteoarthritis of knee     s/p replacement  . Abnormal mammogram     left medial breast - benign fat necrosis with residual calcification  . Benign head tremor   . Atrial fibrillation ([place]) 2010    s/p DC cardioversion. on coumadin  . Insomnia   . GERD (gastroesophageal reflux disease)   . Chronic bronchitis ([place])     "used to get it alot; not so much anymore" (08/27/2013)  . Sleep apnea     "does not wear mask"  (08/27/2013)  . Rectus sheath hematoma 08/27/2013    R after bridging with lovenox  . H/O hiatal hernia     moderate sized/notes  . History of diverticulitis of colon 08/2013    sigmoid with possible microperf/abscess s/p hospitalization complicated by rectus sheath hematoma during lovenox bridge, leading to ARF from hematoma compression on kidney  . RSV bronchitis 09/13/2013    Past Surgical History  Procedure Laterality Date  . Replacement total knee Bilateral 2005  . Appendectomy  1947  . Dilation and curettage of uterus    . Cholecystectomy  1980  . Cardiac catheterization      [facility];Dr. Clayborn Bigness  . Dexa  10/2012    WNL  . Tonsillectomy    . Joint replacement    . Abdominal exploration surgery  1971    "to see if I needed hysterectomy; dr thought qthing looked too good so he didn't do a hysterectomy at this time" (08/27/2013)  . Vaginal hysterectomy  1971    "2 wks after 1st dr said it was ok" (08/27/2013)  . Cardioversion      [facility] 08/27/2013  . Percutaneous nephrostomy  08/2013    ARF from hematoma compression  . Electrophysiologic study N/A 05/25/2015    Procedure: Cardioversion;  Surgeon: Wellington Hampshire, MD;  Location: [facility] ORS;  Service: Cardiovascular;  Laterality: N/A;    Social History   Social History  . Marital Status: Widowed    Spouse Name: N/A  . Number of Children: N/A  . Years of Education: N/A   Occupational History  . Retired     Freight forwarder [employer]   Social History Main Topics  . Smoking status: Never Smoker   . Smokeless tobacco: Never Used  . Alcohol Use: No  . Drug Use: No  . Sexual Activity: No   Other Topics Concern  . Not on file   Social History Narrative   Caffeine: 1 cup coffee/day   Lives alone in senior living.  Daughter in [place]   Occupation: retired, Scientist, research (medical)   Edu: HS   Activity: walks the track and mall daily   Diet: good water, fruits/vegetables daily    Family Status  Relation Status Death Age  . Mother Deceased       CAD, stroke, DM  . Father Deceased     alzheimer's disease  . Brother Alive     liver transplant  . Sister Alive     2, DM  . Child Alive     6, 3 with HTN    Review of Systems A complete 10 system ROS was obtained and was negative apart from what is mentioned.   Objective:   VITALS:   Filed Vitals:   11/03/15 0918  BP:  152/100  Pulse: 56  Height: 5\' 7"  (1.702 m)  Weight: 277 lb (125.646 kg)   Gen:  Appears stated age and in NAD. HEENT:  Normocephalic, atraumatic. The mucous membranes are moist. The superficial temporal arteries are without ropiness or tenderness. Cardiovascular: Regular rate and rhythm. Lungs: Clear to auscultation bilaterally. Neck: There are no carotid bruits noted bilaterally. Musculoskeletal: Her head is turned to the left and sidebending to the right.  She exhibits a geste antagoniste by touching the chin on the right with her hand.  NEUROLOGICAL:  Orientation:  The patient is alert and oriented x 3.  Recent and remote memory are intact.  Attention span and concentration are normal.  Able to name objects and repeat without trouble.  Fund of knowledge is appropriate Cranial nerves: There is good facial symmetry. The pupils are equal round and reactive to light bilaterally. Fundoscopic exam reveals clear disc margins bilaterally. Extraocular muscles are intact and visual fields are full to confrontational testing. Speech is fluent and clear. Soft palate rises symmetrically and there is no tongue deviation. Hearing is intact to conversational tone. Tone: Tone is good throughout. Sensation: Sensation is intact to light touch and pinprick throughout (facial, trunk, extremities). Vibration is decreased at the bilateral big toe. There is no extinction with double simultaneous stimulation. There is no sensory dermatomal level identified. Coordination:  The patient has no dysdiadichokinesia or dysmetria. Motor: Strength is 5/5 in the bilateral upper and lower  extremities.  Shoulder shrug is equal bilaterally.  There is no pronator drift.  There are no fasciculations noted. DTR's: Deep tendon reflexes are 2+/4 at the bilateral biceps, triceps, brachioradialis, patella and 1/4 at the bilateral achilles.  Plantar responses are downgoing bilaterally. Gait and Station: The patient is able to ambulate without difficulty. The patient is able to heel toe walk without any difficulty. The patient is able to ambulate in a tandem fashion. The patient is able to stand in the Romberg position.   MOVEMENT EXAM: Tremor:  There is intention tremor in the right greater than left hand.  She has significant difficulty with Archimedes spirals on the right, but not the left.  She does spill water when asked to pour water from a full glass to an empty one.  Labs:  Lab Results  Component Value Date   TSH 2.07 04/17/2015     Chemistry      Component Value Date/Time   NA 143 05/19/2015 1047   NA 141 04/17/2015 0949   K 4.4 05/19/2015 1047   CL 102 05/19/2015 1047   CO2 22 05/19/2015 1047   BUN 12 05/19/2015 1047   BUN 17 04/17/2015 0949   CREATININE 0.88 05/19/2015 1047   CREATININE 1.14* 08/12/2013 1853      Component Value Date/Time   CALCIUM 8.9 05/19/2015 1047   ALKPHOS 112 04/17/2015 0949   AST 18 04/17/2015 0949   ALT 21 04/17/2015 0949   BILITOT 1.0 04/17/2015 0949          Assessment/Plan:   1.  Essential Tremor.  -This is asymmetric, with the right being worse than the left.  We talked about various treatment.  She is already on a beta blocker, which cannot be increased because of bradycardia.  We talked about primidone, but we also talked about the fact that it interacts with the coumadin.  Ive not had issues at low dosages, but I know that some of the cardiologist prefer pts don't take it and will defer to her cardiologist.  I have sent him a message asking him about this.  She is not interested in DBS after discussion about this 2.  Cervical  dystonia  -involves the R SCM, L splenius capitus and R levator scapulae.  Talked to her about nature and pathophys.  Talked to her about botox and risk since she is on coumadin.  States that this doesn't bother her and she doesn't wish to pursue.   3.  Will follow up with her in 3 months after she is able to start primidone, if she able to do so.  Much greater than 50% of this visit was spent in counseling with the patient.  Total face to face time:  45 min

## 2015-11-03 NOTE — Patient Instructions (Signed)
1.  I sent Dr. Fletcher Anon a message about the primidone and whether or not we can start that medication.  We will call you and let you know what he says.

## 2015-11-06 ENCOUNTER — Telehealth: Payer: Self-pay | Admitting: Neurology

## 2015-11-06 MED ORDER — PRIMIDONE 50 MG PO TABS: 50.0000 mg | ORAL_TABLET | Freq: Every day | ORAL | Status: DC

## 2015-11-06 NOTE — Telephone Encounter (Signed)
Patient made aware she can start Primidone. Made aware to split tablet in half the first four nights and made aware of first dose affect. She will contact Dr Danise Mina to have INR checked approx 5 days after starting medication. She will call with any problems.

## 2015-11-06 NOTE — Telephone Encounter (Signed)
-----   Message from [place], DO sent at 11/06/2015  7:43 AM EST ----- Luvenia Starch,   Please let pt know and you may send in the primidone as discussed.    ----- Message -----    From: Wellington Hampshire, MD    Sent: 11/04/2015   7:19 AM      To: Ria Bush, MD, Eustace Quail Tat, DO  Hi,  I think that should be fine. Primidone can decrease the efficacy of Warfarin and the dose might need to be adjusted. I recommend checking INR within 5 days after starting the medication. I will include Dr. Danise Mina on this given that he manages her anticoagulation. Thanks.   MA   ----- Message -----    From: Ludwig Clarks, DO    Sent: 11/03/2015  10:10 AM      To: Wellington Hampshire, MD  Hello!  Wanted to ask you a quick question.  I am the movement d/o neurologist and saw this patient today.  i want to put her on low dose primidone.  I do know that in theory this interacts with coumadin, but have not had a problem with it in low dosages.  However, I told her that I would ask you first.  She is limited in tremor treatments b/c she is already bradycardic and I cannot do anything with beta blockers.  She doesn't want to be considered for DBS.    Thank you for your input,  Wells Guiles Tat

## 2015-11-08 ENCOUNTER — Telehealth: Payer: Self-pay | Admitting: Family Medicine

## 2015-11-08 NOTE — Telephone Encounter (Signed)
Bernie from Dr. Marlowe Aschoff office at [facility] called. Would like for you to remind patient that she needs to discontinue coumadin 5 days prior to her surgery on 11/27/15. She had bloody discharge from right nipple and they are doing a duct excision. Can you please remind patient at coumadin appointment tomorrow?   Any questions please call Jearld Fenton at [phone]

## 2015-11-09 ENCOUNTER — Ambulatory Visit (INDEPENDENT_AMBULATORY_CARE_PROVIDER_SITE_OTHER): Payer: PPO | Admitting: *Deleted

## 2015-11-09 DIAGNOSIS — I4891 Unspecified atrial fibrillation: Secondary | ICD-10-CM | POA: Diagnosis not present

## 2015-11-09 DIAGNOSIS — Z5181 Encounter for therapeutic drug level monitoring: Secondary | ICD-10-CM | POA: Diagnosis not present

## 2015-11-09 LAB — POCT INR: INR: 2.8

## 2015-11-09 NOTE — Telephone Encounter (Signed)
Instructions given to patient both verbally and written.  Patient voiced understanding.

## 2015-11-09 NOTE — Progress Notes (Signed)
Pre visit review using our clinic review tool, if applicable. No additional management support is needed unless otherwise documented below in the visit note. 

## 2015-11-18 DIAGNOSIS — S8390XA Sprain of unspecified site of unspecified knee, initial encounter: Secondary | ICD-10-CM | POA: Diagnosis not present

## 2015-11-18 DIAGNOSIS — G4733 Obstructive sleep apnea (adult) (pediatric): Secondary | ICD-10-CM | POA: Diagnosis not present

## 2015-11-20 ENCOUNTER — Encounter (HOSPITAL_BASED_OUTPATIENT_CLINIC_OR_DEPARTMENT_OTHER): Payer: Self-pay | Admitting: *Deleted

## 2015-11-20 NOTE — Progress Notes (Signed)
Patient is on Coumadin for A-fib. Last dose will be 11-21-15. She will come in on Fri 11-24-15 for PT/INR. Patient also has hx severe OSA and has trouble sleeping with CPAP machine but states she is doing better with sleep meds. She will also be anesthesia consult when coming for labs. Last EKG 10-20-15 was NSR.

## 2015-11-24 ENCOUNTER — Encounter (HOSPITAL_BASED_OUTPATIENT_CLINIC_OR_DEPARTMENT_OTHER)
Admission: RE | Admit: 2015-11-24 | Discharge: 2015-11-24 | Disposition: A | Discharge status: Home / Self Care | Payer: PPO | Source: Ambulatory Visit | Attending: General Surgery | Admitting: General Surgery

## 2015-11-24 DIAGNOSIS — G250 Essential tremor: Secondary | ICD-10-CM | POA: Diagnosis not present

## 2015-11-24 DIAGNOSIS — C50011 Malignant neoplasm of nipple and areola, right female breast: Secondary | ICD-10-CM | POA: Diagnosis not present

## 2015-11-24 DIAGNOSIS — N6452 Nipple discharge: Secondary | ICD-10-CM | POA: Diagnosis not present

## 2015-11-24 LAB — BASIC METABOLIC PANEL
ANION GAP: 8 (ref 5–15)
BUN: 19 mg/dL (ref 6–20)
CO2: 28 mmol/L (ref 22–32)
Calcium: 9.3 mg/dL (ref 8.9–10.3)
Chloride: 102 mmol/L (ref 101–111)
Creatinine, Ser: 0.9 mg/dL (ref 0.44–1.00)
GFR calc non Af Amer: >60 mL/min (ref >60)
GLUCOSE: 110 mg/dL — AB (ref 65–99)
POTASSIUM: 5.5 mmol/L — AB (ref 3.5–5.1)
Sodium: 138 mmol/L (ref 135–145)

## 2015-11-24 LAB — CBC WITH DIFFERENTIAL/PLATELET
Basophils Absolute: 0 10*3/uL (ref 0.0–0.1)
Basophils Relative: 1 %
Eosinophils Absolute: 0.1 10*3/uL (ref 0.0–0.7)
Eosinophils Relative: 1 %
HEMATOCRIT: 42.9 % (ref 36.0–46.0)
HEMOGLOBIN: 13.7 g/dL (ref 12.0–15.0)
LYMPHS PCT: 23 %
Lymphs Abs: 1.5 10*3/uL (ref 0.7–4.0)
MCH: 27.9 pg (ref 26.0–34.0)
MCHC: 31.9 g/dL (ref 30.0–36.0)
MCV: 87.4 fL (ref 78.0–100.0)
MONO ABS: 0.6 10*3/uL (ref 0.1–1.0)
MONOS PCT: 9 %
NEUTROS ABS: 4.4 10*3/uL (ref 1.7–7.7)
NEUTROS PCT: 66 %
Platelets: 276 10*3/uL (ref 150–400)
RBC: 4.91 MIL/uL (ref 3.87–5.11)
RDW: 14.3 % (ref 11.5–15.5)
WBC: 6.5 10*3/uL (ref 4.0–10.5)

## 2015-11-24 LAB — PROTIME-INR
INR: 1.23 (ref 0.00–1.49)
Prothrombin Time: 15.6 seconds — ABNORMAL HIGH (ref 11.6–15.2)

## 2015-11-24 NOTE — Pre-Procedure Instructions (Signed)
Pt. here for labs; discussed sleep apnea with Dr. Lissa Hoard; have pt. bring CPAP machine with her DOS; OK to come for surgery.

## 2015-11-24 NOTE — Pre-Procedure Instructions (Signed)
Dr. Lissa Hoard notified of K+ of 5.5 - repeat DOS with istat.

## 2015-11-26 NOTE — Anesthesia Preprocedure Evaluation (Addendum)
Anesthesia Evaluation  Patient identified by MRN, date of birth, ID band Patient awake    Reviewed: Allergy & Precautions, NPO status , Patient's Chart, lab work & pertinent test results  History of Anesthesia Complications Negative for: history of anesthetic complications  Airway Mallampati: III  TM Distance: >3 FB Neck ROM: Full    Dental no notable dental hx. (+) Partial Lower   Pulmonary asthma , sleep apnea and Continuous Positive Airway Pressure Ventilation ,    Pulmonary exam normal breath sounds clear to auscultation       Cardiovascular hypertension, Pt. on medications and Pt. on home beta blockers Normal cardiovascular exam+ dysrhythmias Atrial Fibrillation  Rhythm:Regular Rate:Normal  Echo 06/2015: Left ventricle: The cavity size was normal. Systolic function was  normal. The estimated ejection fraction was in the range of 60%  to 65%. Wall motion was normal; there were no regional wall  motion abnormalities. Left ventricular diastolic function  parameters were normal. - Aorta: Ascending aortic diameter: mildly dilated, 35 mm (S). - Mitral valve: There was mild to moderate regurgitation. - Left atrium: The atrium was moderately dilated. - Right ventricle: Systolic function was normal. - Pulmonary arteries: Systolic pressure was within the normal  range.   Neuro/Psych negative neurological ROS  negative psych ROS   GI/Hepatic Neg liver ROS, GERD  Medicated and Controlled,  Endo/Other  Hypothyroidism Morbid obesity  Renal/GU negative Renal ROS  negative genitourinary   Musculoskeletal  (+) Arthritis , Osteoarthritis,    Abdominal   Peds negative pediatric ROS (+)  Hematology negative hematology ROS (+)   Anesthesia Other Findings   Reproductive/Obstetrics negative OB ROS                            Anesthesia Physical Anesthesia Plan  ASA: III  Anesthesia Plan:  General   Post-op Pain Management:    Induction: Intravenous  Airway Management Planned: LMA  Additional Equipment:   Intra-op Plan:   Post-operative Plan: Extubation in OR  Informed Consent: I have reviewed the patients History and Physical, chart, labs and discussed the procedure including the risks, benefits and alternatives for the proposed anesthesia with the patient or authorized representative who has indicated his/her understanding and acceptance.   Dental advisory given  Plan Discussed with: CRNA  Anesthesia Plan Comments:         Anesthesia Quick Evaluation

## 2015-11-27 ENCOUNTER — Encounter (HOSPITAL_BASED_OUTPATIENT_CLINIC_OR_DEPARTMENT_OTHER)
Admission: RE | Disposition: A | Discharge status: Home / Self Care | Payer: Self-pay | Source: Ambulatory Visit | Attending: General Surgery

## 2015-11-27 ENCOUNTER — Encounter (HOSPITAL_BASED_OUTPATIENT_CLINIC_OR_DEPARTMENT_OTHER): Payer: Self-pay | Admitting: *Deleted

## 2015-11-27 ENCOUNTER — Other Ambulatory Visit: Payer: Self-pay | Admitting: Family Medicine

## 2015-11-27 ENCOUNTER — Ambulatory Visit: Payer: Self-pay

## 2015-11-27 ENCOUNTER — Ambulatory Visit (HOSPITAL_BASED_OUTPATIENT_CLINIC_OR_DEPARTMENT_OTHER)
Admission: RE | Admit: 2015-11-27 | Discharge: 2015-11-27 | Disposition: A | Discharge status: Home / Self Care | Payer: PPO | Source: Ambulatory Visit | Attending: General Surgery | Admitting: General Surgery

## 2015-11-27 ENCOUNTER — Ambulatory Visit (HOSPITAL_BASED_OUTPATIENT_CLINIC_OR_DEPARTMENT_OTHER): Payer: PPO | Admitting: Anesthesiology

## 2015-11-27 ENCOUNTER — Ambulatory Visit: Payer: Self-pay | Admitting: Internal Medicine

## 2015-11-27 DIAGNOSIS — C50011 Malignant neoplasm of nipple and areola, right female breast: Secondary | ICD-10-CM | POA: Insufficient documentation

## 2015-11-27 DIAGNOSIS — D0511 Intraductal carcinoma in situ of right breast: Secondary | ICD-10-CM | POA: Diagnosis not present

## 2015-11-27 DIAGNOSIS — G250 Essential tremor: Secondary | ICD-10-CM | POA: Diagnosis not present

## 2015-11-27 DIAGNOSIS — N6452 Nipple discharge: Secondary | ICD-10-CM | POA: Insufficient documentation

## 2015-11-27 DIAGNOSIS — I10 Essential (primary) hypertension: Secondary | ICD-10-CM | POA: Diagnosis not present

## 2015-11-27 HISTORY — PX: BREAST DUCTAL SYSTEM EXCISION: SHX5242

## 2015-11-27 HISTORY — DX: Cardiac arrhythmia, unspecified: I49.9

## 2015-11-27 HISTORY — DX: Reserved for inherently not codable concepts without codable children: IMO0001

## 2015-11-27 LAB — POCT I-STAT, CHEM 8
BUN: 20 mg/dL (ref 6–20)
Calcium, Ion: 1 mmol/L — ABNORMAL LOW (ref 1.13–1.30)
Chloride: 101 mmol/L (ref 101–111)
Creatinine, Ser: 0.7 mg/dL (ref 0.44–1.00)
GLUCOSE: 104 mg/dL — AB (ref 65–99)
HEMATOCRIT: 43 % (ref 36.0–46.0)
HEMOGLOBIN: 14.6 g/dL (ref 12.0–15.0)
POTASSIUM: 3.7 mmol/L (ref 3.5–5.1)
SODIUM: 138 mmol/L (ref 135–145)
TCO2: 23 mmol/L (ref 0–100)

## 2015-11-27 SURGERY — EXCISION DUCTAL SYSTEM BREAST
Anesthesia: General | Site: Breast | Laterality: Right

## 2015-11-27 MED ORDER — ONDANSETRON HCL 4 MG/2ML IJ SOLN
INTRAMUSCULAR | Status: AC
  Filled 2015-11-27: qty 2

## 2015-11-27 MED ORDER — FENTANYL CITRATE (PF) 100 MCG/2ML IJ SOLN
INTRAMUSCULAR | Status: DC | PRN
  Administered 2015-11-27 (×3): 25 ug via INTRAVENOUS

## 2015-11-27 MED ORDER — ONDANSETRON HCL 4 MG/2ML IJ SOLN
4.0000 mg | Freq: Once | INTRAMUSCULAR | Status: DC | PRN
Start: 2015-11-27 — End: 2015-11-27

## 2015-11-27 MED ORDER — ACETAMINOPHEN 10 MG/ML IV SOLN
INTRAVENOUS | Status: DC | PRN
  Administered 2015-11-27: 1000 mg via INTRAVENOUS

## 2015-11-27 MED ORDER — ACETAMINOPHEN 10 MG/ML IV SOLN
INTRAVENOUS | Status: AC
  Filled 2015-11-27: qty 100

## 2015-11-27 MED ORDER — FENTANYL CITRATE (PF) 100 MCG/2ML IJ SOLN: 50.0000 ug | INTRAMUSCULAR | Status: DC | PRN

## 2015-11-27 MED ORDER — DEXAMETHASONE SODIUM PHOSPHATE 10 MG/ML IJ SOLN
INTRAMUSCULAR | Status: AC
  Filled 2015-11-27: qty 1

## 2015-11-27 MED ORDER — DEXTROSE 5 % IV SOLN
2.0000 g | INTRAVENOUS | Status: AC
  Administered 2015-11-27: 3 g via INTRAVENOUS

## 2015-11-27 MED ORDER — PROPOFOL 10 MG/ML IV BOLUS
INTRAVENOUS | Status: AC
  Filled 2015-11-27: qty 40

## 2015-11-27 MED ORDER — BUPIVACAINE HCL (PF) 0.25 % IJ SOLN
INTRAMUSCULAR | Status: DC | PRN
  Administered 2015-11-27: 30 mL

## 2015-11-27 MED ORDER — LACTATED RINGERS IV SOLN
INTRAVENOUS | Status: DC
  Administered 2015-11-27 (×2): via INTRAVENOUS

## 2015-11-27 MED ORDER — CEFAZOLIN SODIUM-DEXTROSE 2-4 GM/100ML-% IV SOLN
INTRAVENOUS | Status: AC
  Filled 2015-11-27: qty 100

## 2015-11-27 MED ORDER — LIDOCAINE HCL (CARDIAC) 20 MG/ML IV SOLN
INTRAVENOUS | Status: AC
  Filled 2015-11-27: qty 5

## 2015-11-27 MED ORDER — LIDOCAINE HCL (CARDIAC) 20 MG/ML IV SOLN
INTRAVENOUS | Status: DC | PRN
  Administered 2015-11-27: 50 mg via INTRAVENOUS

## 2015-11-27 MED ORDER — MIDAZOLAM HCL 2 MG/2ML IJ SOLN: 1.0000 mg | INTRAMUSCULAR | Status: DC | PRN

## 2015-11-27 MED ORDER — FENTANYL CITRATE (PF) 100 MCG/2ML IJ SOLN: 25.0000 ug | INTRAMUSCULAR | Status: DC | PRN

## 2015-11-27 MED ORDER — BUPIVACAINE HCL (PF) 0.25 % IJ SOLN
INTRAMUSCULAR | Status: AC
  Filled 2015-11-27: qty 30

## 2015-11-27 MED ORDER — CEFAZOLIN SODIUM 1-5 GM-% IV SOLN
INTRAVENOUS | Status: AC
  Filled 2015-11-27: qty 50

## 2015-11-27 MED ORDER — HYDROCODONE-ACETAMINOPHEN 5-325 MG PO TABS: 0.5000 | ORAL_TABLET | ORAL | Status: DC | PRN

## 2015-11-27 MED ORDER — GLYCOPYRROLATE 0.2 MG/ML IJ SOLN
0.2000 mg | Freq: Once | INTRAMUSCULAR | Status: AC | PRN
  Administered 2015-11-27 (×3): 0.2 mg via INTRAVENOUS

## 2015-11-27 MED ORDER — SCOPOLAMINE 1 MG/3DAYS TD PT72: 1.0000 | MEDICATED_PATCH | Freq: Once | TRANSDERMAL | Status: DC | PRN

## 2015-11-27 MED ORDER — DEXAMETHASONE SODIUM PHOSPHATE 4 MG/ML IJ SOLN
INTRAMUSCULAR | Status: DC | PRN
  Administered 2015-11-27: 10 mg via INTRAVENOUS

## 2015-11-27 MED ORDER — FENTANYL CITRATE (PF) 100 MCG/2ML IJ SOLN
INTRAMUSCULAR | Status: AC
  Filled 2015-11-27: qty 2

## 2015-11-27 MED ORDER — PROPOFOL 10 MG/ML IV BOLUS
INTRAVENOUS | Status: DC | PRN
  Administered 2015-11-27: 200 mg via INTRAVENOUS

## 2015-11-27 SURGICAL SUPPLY — 50 items
BLADE HEX COATED 2.75 (ELECTRODE) ×3 IMPLANT
BLADE SURG 15 STRL LF DISP TIS (BLADE) ×1 IMPLANT
BLADE SURG 15 STRL SS (BLADE) ×3
CANISTER SUCT 1200ML W/VALVE (MISCELLANEOUS) ×3 IMPLANT
CHLORAPREP W/TINT 26ML (MISCELLANEOUS) ×3 IMPLANT
CLIP TI LARGE 6 (CLIP) ×2 IMPLANT
CLOSURE WOUND 1/2 X4 (GAUZE/BANDAGES/DRESSINGS) ×1
COVER BACK TABLE 60X90IN (DRAPES) ×3 IMPLANT
COVER MAYO STAND STRL (DRAPES) ×3 IMPLANT
DECANTER SPIKE VIAL GLASS SM (MISCELLANEOUS) IMPLANT
DEVICE DUBIN W/COMP PLATE 8390 (MISCELLANEOUS) IMPLANT
DRAPE LAPAROTOMY 100X72 PEDS (DRAPES) ×3 IMPLANT
DRAPE UTILITY XL STRL (DRAPES) ×6 IMPLANT
ELECT REM PT RETURN 9FT ADLT (ELECTROSURGICAL) ×3
ELECTRODE REM PT RTRN 9FT ADLT (ELECTROSURGICAL) ×1 IMPLANT
GLOVE BIO SURGEON STRL SZ 6 (GLOVE) ×3 IMPLANT
GLOVE BIO SURGEON STRL SZ 6.5 (GLOVE) ×1 IMPLANT
GLOVE BIO SURGEONS STRL SZ 6.5 (GLOVE) ×1
GLOVE BIOGEL PI IND STRL 6.5 (GLOVE) ×1 IMPLANT
GLOVE BIOGEL PI IND STRL 7.0 (GLOVE) IMPLANT
GLOVE BIOGEL PI INDICATOR 6.5 (GLOVE) ×2
GLOVE BIOGEL PI INDICATOR 7.0 (GLOVE) ×4
GOWN STRL REUS W/ TWL LRG LVL3 (GOWN DISPOSABLE) ×1 IMPLANT
GOWN STRL REUS W/TWL 2XL LVL3 (GOWN DISPOSABLE) ×3 IMPLANT
GOWN STRL REUS W/TWL LRG LVL3 (GOWN DISPOSABLE) ×3
ILLUMINATOR WAVEGUIDE N/F (MISCELLANEOUS) IMPLANT
LIGHT WAVEGUIDE WIDE FLAT (MISCELLANEOUS) IMPLANT
LIQUID BAND (GAUZE/BANDAGES/DRESSINGS) ×3 IMPLANT
NDL HYPO 25X1 1.5 SAFETY (NEEDLE) ×1 IMPLANT
NEEDLE HYPO 25X1 1.5 SAFETY (NEEDLE) ×3 IMPLANT
NS IRRIG 1000ML POUR BTL (IV SOLUTION) ×3 IMPLANT
PACK BASIN DAY SURGERY FS (CUSTOM PROCEDURE TRAY) ×3 IMPLANT
PENCIL BUTTON HOLSTER BLD 10FT (ELECTRODE) ×3 IMPLANT
SLEEVE SCD COMPRESS KNEE MED (MISCELLANEOUS) ×3 IMPLANT
SPONGE GAUZE 4X4 12PLY STER LF (GAUZE/BANDAGES/DRESSINGS) ×3 IMPLANT
SPONGE LAP 18X18 X RAY DECT (DISPOSABLE) ×3 IMPLANT
STAPLER VISISTAT 35W (STAPLE) IMPLANT
STRIP CLOSURE SKIN 1/2X4 (GAUZE/BANDAGES/DRESSINGS) ×2 IMPLANT
SUT MON AB 4-0 PC3 18 (SUTURE) ×3 IMPLANT
SUT SILK 2 0 SH (SUTURE) ×3 IMPLANT
SUT VIC AB 3-0 54X BRD REEL (SUTURE) IMPLANT
SUT VIC AB 3-0 BRD 54 (SUTURE)
SUT VIC AB 3-0 SH 27 (SUTURE) ×3
SUT VIC AB 3-0 SH 27X BRD (SUTURE) ×1 IMPLANT
SYR CONTROL 10ML LL (SYRINGE) ×3 IMPLANT
TOWEL OR 17X24 6PK STRL BLUE (TOWEL DISPOSABLE) ×3 IMPLANT
TOWEL OR NON WOVEN STRL DISP B (DISPOSABLE) ×3 IMPLANT
TUBE CONNECTING 20'X1/4 (TUBING) ×1
TUBE CONNECTING 20X1/4 (TUBING) ×2 IMPLANT
YANKAUER SUCT BULB TIP NO VENT (SUCTIONS) ×3 IMPLANT

## 2015-11-27 NOTE — Anesthesia Postprocedure Evaluation (Signed)
Anesthesia Post Note  Patient: Mckenzie Frank  Procedure(s) Performed: Procedure(s) (LRB): RIGHT  MAJOR DUCT  EXCISION (Right)  Patient location during evaluation: PACU Anesthesia Type: General Level of consciousness: awake and alert Pain management: pain level controlled Vital Signs Assessment: post-procedure vital signs reviewed and stable Respiratory status: spontaneous breathing, nonlabored ventilation, respiratory function stable and patient connected to nasal cannula oxygen Cardiovascular status: blood pressure returned to baseline and stable Postop Assessment: no signs of nausea or vomiting Anesthetic complications: no    Last Vitals:  Filed Vitals:   11/27/15 0915 11/27/15 0930  BP: 135/75   Pulse: 53 55  Temp:    Resp: 15 14    Last Pain: There were no vitals filed for this visit.               Roselina Burgueno JENNETTE

## 2015-11-27 NOTE — H&P (Signed)
Mckenzie Frank 10/30/2015 1:14 PM Location: [facility] Patient #: [id number] DOB: 02/23/1941 Widowed / Language: Cleophus Molt / Race: White Female   History of Present Illness Stark Klein MD; 10/30/2015 2:54 PM) Patient words: breast f/u.  The patient is a 75 year old female who presents with a complaint of nipple discharge. Patient is 75 year old female who presented last summer with right bloody nipple discharge. She underwent imaging at the breast center and was felt to possibly have a papilloma. Unfortunately, biopsy just showed adipose tissue and blood. She had resolution of the nipple discharge temporarily, but this started again. Repeat imaging was performed and did not show a definitive filling defect, but there was a beaded appearance of the duct in the outer right breast at 8 to 9:00. The patient also has some breast pain. She denies breast trauma. She is on Coumadin for atrial fibrillation. The patient has had to have cardioversion several times for atrial fibrillation with rapid ventricular response. She is on flecainide for rate control. Patient states that this will bleed when she is in the shower and sometimes run down her arm. She does not have any personal or family history of breast cancer. She has not ever had bloody nipple discharge in the remote past.   Other Problems Marjean Donna, CMA; 10/30/2015 1:29 PM) Atrial Fibrillation High blood pressure Hypercholesterolemia Sleep Apnea  Past Surgical History Marjean Donna, CMA; 10/30/2015 1:29 PM) Knee Surgery Bilateral.  Diagnostic Studies History Marjean Donna, CMA; 10/30/2015 1:29 PM) Colonoscopy 5-10 years ago Mammogram within last year  Allergies Marjean Donna, CMA; 10/30/2015 1:31 PM) Paxil *ANTIDEPRESSANTS* Codeine Phosphate *ANALGESICS - OPIOID* Clindamycin HCl (Bulk) *CHEMICALS* Doxycycline *DERMATOLOGICALS* Sulfa Antibiotics  Medication History (Sonya Bynum, CMA; 10/30/2015 1:33  PM) Amlodipine Besy-Benazepril HCl (5-40MG  Capsule, Oral) Active. KlonoPIN (0.5MG  Tablet, Oral) Active. Tambocor (150MG  Tablet, Oral) Active. Prevacid (15MG  Capsule DR, Oral) Active. Levothyroxine Sodium (25MCG Tablet, Oral) Active. Losartan Potassium (50MG  Tablet, Oral) Active. Simvastatin (20MG  Tablet, Oral) Active. TraZODone HCl (100MG  Tablet, Oral) Active. Metoprolol Tartrate (25MG  Tablet, Oral) Active. Warfarin Sodium (5MG  Tablet, Oral) Active. Medications Reconciled  Social History Marjean Donna, CMA; 10/30/2015 1:29 PM) Caffeine use Coffee. No alcohol use No drug use Tobacco use Never smoker.  Family History Marjean Donna, CMA; 10/30/2015 1:29 PM) Hypertension Mother, Sister. Migraine Headache Daughter.  Pregnancy / Birth History Marjean Donna, CMA; 10/30/2015 1:29 PM) Age of menopause 66-55 Gravida 7 Maternal age 50-20 Para 6    Review of Systems (Cataract; 10/30/2015 1:29 PM) General Not Present- Appetite Loss, Chills, Fatigue, Fever, Night Sweats, Weight Gain and Weight Loss. Skin Not Present- Change in Wart/Mole, Dryness, Hives, Jaundice, New Lesions, Non-Healing Wounds, Rash and Ulcer. HEENT Not Present- Earache, Hearing Loss, Hoarseness, Nose Bleed, Oral Ulcers, Ringing in the Ears, Seasonal Allergies, Sinus Pain, Sore Throat, Visual Disturbances, Wears glasses/contact lenses and Yellow Eyes. Respiratory Not Present- Bloody sputum, Chronic Cough, Difficulty Breathing, Snoring and Wheezing. Breast Present- Nipple Discharge. Not Present- Breast Mass, Breast Pain and Skin Changes. Cardiovascular Not Present- Chest Pain, Difficulty Breathing Lying Down, Leg Cramps, Palpitations, Rapid Heart Rate, Shortness of Breath and Swelling of Extremities. Gastrointestinal Not Present- Abdominal Pain, Bloating, Bloody Stool, Change in Bowel Habits, Chronic diarrhea, Constipation, Difficulty Swallowing, Excessive gas, Gets full quickly at meals, Hemorrhoids,  Indigestion, Nausea, Rectal Pain and Vomiting. Female Genitourinary Not Present- Frequency, Nocturia, Painful Urination, Pelvic Pain and Urgency. Musculoskeletal Not Present- Back Pain, Joint Pain, Joint Stiffness, Muscle Pain, Muscle Weakness and Swelling of Extremities. Neurological Not  Present- Decreased Memory, Fainting, Headaches, Numbness, Seizures, Tingling, Tremor, Trouble walking and Weakness. Psychiatric Not Present- Anxiety, Bipolar, Change in Sleep Pattern, Depression, Fearful and Frequent crying. Endocrine Not Present- Cold Intolerance, Excessive Hunger, Hair Changes, Heat Intolerance, Hot flashes and New Diabetes. Hematology Not Present- Easy Bruising, Excessive bleeding, Gland problems, HIV and Persistent Infections.  Vitals (Sonya Bynum CMA; 10/30/2015 1:29 PM) 10/30/2015 1:29 PM Weight: 278 lb Temp.: 8F(Temporal)  Pulse: 76 (Regular)  BP: 130/76 (Sitting, Left Arm, Standard)       Physical Exam Stark Klein MD; 10/30/2015 2:55 PM) General Mental Status-Alert. General Appearance-Consistent with stated age. Hydration-Well hydrated. Voice-Normal.  Head and Neck Head-normocephalic, atraumatic with no lesions or palpable masses. Trachea-midline. Thyroid Gland Characteristics - normal size and consistency.  Eye Eyeball - Bilateral-Extraocular movements intact. Sclera/Conjunctiva - Bilateral-No scleral icterus.  Chest and Lung Exam Chest and lung exam reveals -quiet, even and easy respiratory effort with no use of accessory muscles and on auscultation, normal breath sounds, no adventitious sounds and normal vocal resonance. Inspection Chest Wall - Normal. Back - normal.  Breast Note: Right breast is relatively dense. No palpable mass. Her breast is also fairly sore. There is a small amount of blood and had a visit in the nipple. Copious amounts of blood were not expressed, but the patient stated that she had some significant drainage this  morning. There is no lymphadenopathy. The left breast is without discharge. Both nipples have a very slightly inverted appearance, but this is symmetric.   Cardiovascular Cardiovascular examination reveals -normal heart sounds, regular rate and rhythm with no murmurs and normal pedal pulses bilaterally.  Abdomen Inspection Inspection of the abdomen reveals - No Hernias. Palpation/Percussion Palpation and Percussion of the abdomen reveal - Soft, Non Tender, No Rebound tenderness, No Rigidity (guarding) and No hepatosplenomegaly. Auscultation Auscultation of the abdomen reveals - Bowel sounds normal.  Neurologic Neurologic evaluation reveals -alert and oriented x 3 with no impairment of recent or remote memory. Mental Status-Normal.  Musculoskeletal Global Assessment -Note: no gross deformities.  Normal Exam - Left-Upper Extremity Strength Normal and Lower Extremity Strength Normal. Normal Exam - Right-Upper Extremity Strength Normal and Lower Extremity Strength Normal.  Lymphatic Head & Neck  General Head & Neck Lymphatics: Bilateral - Description - Normal. Axillary  General Axillary Region: Bilateral - Description - Normal. Tenderness - Non Tender. Femoral & Inguinal  Generalized Femoral & Inguinal Lymphatics: Bilateral - Description - No Generalized lymphadenopathy.    Assessment & Plan Stark Klein MD; 10/30/2015 2:57 PM) BLOODY DISCHARGE FROM RIGHT NIPPLE [phone]) Impression: There was mention in her imaging that we could consider a breast MRI. However, I do not think that this would change our management if it were negative. We would still have enough suspicion of malignancy given the bloody nipple discharge that recurred. I will proceed to surgery. I discussed the major duct excision with the patient. She has been able to stop her Coumadin in the past for procedures, so we will hold this for 5 days preop. I discussed how we would make an incision at the border  of the nipple and cleaning out of cylinder of tissue that traces with the duct in question. I discussed that her primary risk is bleeding with the Coumadin. She does however have a risk of infection and chronic pain, and chance of having cancer. I discussed that if she does have a malignancy she will likely need additional surgery and treatment.  She understands and wishes to proceed. Follow-up should be  in the [place] office. Current Plans Pt Education - [facility] Breast Biopsy HCI: discussed with patient and provided information. You are being scheduled for surgery - Our schedulers will call you.  You should hear from our office's scheduling department within 5 working days about the location, date, and time of surgery. We try to make accommodations for patient's preferences in scheduling surgery, but sometimes the OR schedule or the surgeon's schedule prevents [place] from making those accommodations.  If you have not heard from our office [phone]) in 5 working days, call the office and ask for your surgeon's nurse.  If you have other questions about your diagnosis, plan, or surgery, call the office and ask for your surgeon's nurse.  BENIGN ESSENTIAL TREMOR (G25.0)    Signed by Stark Klein, MD (10/30/2015 2:57 PM)

## 2015-11-27 NOTE — Op Note (Signed)
PRE-OPERATIVE DIAGNOSIS: bloody right nipple discharge  POST-OPERATIVE DIAGNOSIS:  Same  PROCEDURE:  Procedure(s): Right major duct excision  SURGEON:  Surgeon(s): Stark Klein, MD  ANESTHESIA:   local and general  DRAINS: none   LOCAL MEDICATIONS USED:  BUPIVICAINE  and LIDOCAINE   SPECIMEN:  Source of Specimen:  right major duct excision  DISPOSITION OF SPECIMEN:  PATHOLOGY  COUNTS:  YES  DICTATION: .Dragon Dictation  PLAN OF CARE: Discharge to home after PACU  PATIENT DISPOSITION:  PACU - hemodynamically stable.  FINDINGS:  Tract from bloody duct extended toward 8 o'clock  EBL: min  PROCEDURE:  Patient was identified in the holding area and taken to the operating room where she was placed supine on operating room table. General anesthesia was induced. The right breast was prepped and draped in sterile fashion. Timeout was performed according to the surgical safety checklist. When all was correct, we continued.  The nipple was examined and the location of the bloody duct was located. The smallest lacrimal duct probe was used to access this duct. This was upsized to the size 3 lacrimal duct probe. The probe was directed in the 8 to 8:30 direction. A circumareolar incision was made on the lower outer quadrant of the nipple. The subcutaneous skin was opened with cautery. Sharp dissection was then used to go toward the nipple underneath the skin. Once the probe was encountered, Allis clamps were used to hold the tissue around the probe. The cautery was then used to dissect a cylinder of tissue around the probe. The probe was then removed and the tissue disconnected. This was then marked with the margin marker paint kit.   The cavity was then inspected for hemostasis very meticulously. Local anesthetic was administered throughout the case. The cavity was then irrigated and reinspected for hemostasis. Additional local was administered. The skin was then reapproximated with 3-0 vicryl  interrupted deep dermal sutures and running 4-0 Monocryl subcuticular suture. The incision was then cleaned, dried, and dressed with Dermabond, Steri-Strips, gauze, and a breast binder.   The patient was allowed to emerge from anesthesia and taken to the PACU in stable condition. Needle, sponge, and instrument counts were correct 2.

## 2015-11-27 NOTE — Anesthesia Procedure Notes (Signed)
Procedure Name: LMA Insertion Date/Time: 11/27/2015 7:45 AM Performed by: Toula Moos L Pre-anesthesia Checklist: Patient identified, Emergency Drugs available, Suction available, Patient being monitored and Timeout performed Patient Re-evaluated:Patient Re-evaluated prior to inductionOxygen Delivery Method: Circle System Utilized Preoxygenation: Pre-oxygenation with 100% oxygen Intubation Type: IV induction Ventilation: Mask ventilation without difficulty LMA: LMA inserted LMA Size: 4.0 Number of attempts: 1 Airway Equipment and Method: Bite block Placement Confirmation: positive ETCO2 Tube secured with: Tape Dental Injury: Teeth and Oropharynx as per pre-operative assessment

## 2015-11-27 NOTE — Transfer of Care (Signed)
Immediate Anesthesia Transfer of Care Note  Patient: Mckenzie Frank  Procedure(s) Performed: Procedure(s): RIGHT  MAJOR DUCT  EXCISION (Right)  Patient Location: PACU  Anesthesia Type:General  Level of Consciousness: awake and patient cooperative  Airway & Oxygen Therapy: Patient Spontanous Breathing and Patient connected to face mask oxygen  Post-op Assessment: Report given to RN and Post -op Vital signs reviewed and stable  Post vital signs: Reviewed and stable  Last Vitals:  Filed Vitals:   11/27/15 0638  BP: 170/64  Pulse: 55  Temp: 36.6 C  Resp: 20    Complications: No apparent anesthesia complications

## 2015-11-27 NOTE — Interval H&P Note (Signed)
History and Physical Interval Note:  11/27/2015 7:35 AM  Mckenzie Frank  has presented today for surgery, with the diagnosis of right bloody nipple discharge  The various methods of treatment have been discussed with the patient and family. After consideration of risks, benefits and other options for treatment, the patient has consented to  Procedure(s): RIGHT  MAJOR DUCT  EXCISION (Right) as a surgical intervention .  The patient's history has been reviewed, patient examined, no change in status, stable for surgery.  I have reviewed the patient's chart and labs.  Questions were answered to the patient's satisfaction.     Eriyah Fernando

## 2015-11-27 NOTE — Discharge Instructions (Addendum)
[facility] °Office Phone Number [phone] ° °BREAST BIOPSY/ PARTIAL MASTECTOMY: POST OP INSTRUCTIONS ° °Always review your discharge instruction sheet given to you by the facility where your surgery was performed. ° °IF YOU HAVE DISABILITY OR FAMILY LEAVE FORMS, YOU MUST BRING THEM TO THE OFFICE FOR PROCESSING.  DO NOT GIVE THEM TO YOUR DOCTOR. ° °1. A prescription for pain medication may be given to you upon discharge.  Take your pain medication as prescribed, if needed.  If narcotic pain medicine is not needed, then you may take acetaminophen (Tylenol) or ibuprofen (Advil) as needed. °2. Take your usually prescribed medications unless otherwise directed °3. If you need a refill on your pain medication, please contact your pharmacy.  They will contact our office to request authorization.  Prescriptions will not be filled after 5pm or on week-ends. °4. You should eat very light the first 24 hours after surgery, such as soup, crackers, pudding, etc.  Resume your normal diet the day after surgery. °5. Most patients will experience some swelling and bruising in the breast.  Ice packs and a good support bra will help.  Swelling and bruising can take several days to resolve.  °6. It is common to experience some constipation if taking pain medication after surgery.  Increasing fluid intake and taking a stool softener will usually help or prevent this problem from occurring.  A mild laxative (Milk of Magnesia or Miralax) should be taken according to package directions if there are no bowel movements after 48 hours. °7. Unless discharge instructions indicate otherwise, you may remove your bandages 48 hours after surgery, and you may shower at that time.  You may have steri-strips (small skin tapes) in place directly over the incision.  These strips should be left on the skin for 7-10 days.   Any sutures or staples will be removed at the office during your follow-up visit. °8. ACTIVITIES:  You may resume  regular daily activities (gradually increasing) beginning the next day.  Wearing a good support bra or sports bra (or the breast binder) minimizes pain and swelling.  You may have sexual intercourse when it is comfortable. °a. You may drive when you no longer are taking prescription pain medication, you can comfortably wear a seatbelt, and you can safely maneuver your car and apply brakes. °b. RETURN TO WORK:  __________1 week_______________ °9. You should see your doctor in the office for a follow-up appointment approximately two weeks after your surgery.  Your doctor’s nurse will typically make your follow-up appointment when she calls you with your pathology report.  Expect your pathology report 2-3 business days after your surgery.  You may call to check if you do not hear from us after three days. ° ° °WHEN TO CALL YOUR DOCTOR: °1. Fever over 101.0 °2. Nausea and/or vomiting. °3. Extreme swelling or bruising. °4. Continued bleeding from incision. °5. Increased pain, redness, or drainage from the incision. ° °The clinic staff is available to answer your questions during regular business hours.  Please don’t hesitate to call and ask to speak to one of the nurses for clinical concerns.  If you have a medical emergency, go to the nearest emergency room or call 911.  A surgeon from [facility] is always on call at the hospital. ° °For further questions, please visit [email or web address]  ° ° °Post Anesthesia Home Care Instructions ° °Activity: °Get plenty of rest for the remainder of the day. A responsible adult should stay with you for 24   hours following the procedure.  °For the next 24 hours, DO NOT: °-Drive a car °-Operate machinery °-Drink alcoholic beverages °-Take any medication unless instructed by your physician °-Make any legal decisions or sign important papers. ° °Meals: °Start with liquid foods such as gelatin or soup. Progress to regular foods as tolerated. Avoid greasy, spicy, heavy  foods. If nausea and/or vomiting occur, drink only clear liquids until the nausea and/or vomiting subsides. Call your physician if vomiting continues. ° °Special Instructions/Symptoms: °Your throat may feel dry or sore from the anesthesia or the breathing tube placed in your throat during surgery. If this causes discomfort, gargle with warm salt water. The discomfort should disappear within 24 hours. ° °If you had a scopolamine patch placed behind your ear for the management of post- operative nausea and/or vomiting: ° °1. The medication in the patch is effective for 72 hours, after which it should be removed.  Wrap patch in a tissue and discard in the trash. Wash hands thoroughly with soap and water. °2. You may remove the patch earlier than 72 hours if you experience unpleasant side effects which may include dry mouth, dizziness or visual disturbances. °3. Avoid touching the patch. Wash your hands with soap and water after contact with the patch. °  ° °

## 2015-11-27 NOTE — Telephone Encounter (Signed)
Ok to refill 

## 2015-11-28 ENCOUNTER — Encounter (HOSPITAL_BASED_OUTPATIENT_CLINIC_OR_DEPARTMENT_OTHER): Payer: Self-pay | Admitting: General Surgery

## 2015-11-28 NOTE — Telephone Encounter (Signed)
Rx called in as directed.   

## 2015-11-28 NOTE — Telephone Encounter (Signed)
plz phone in. 

## 2015-11-30 ENCOUNTER — Telehealth: Payer: Self-pay | Admitting: General Surgery

## 2015-11-30 ENCOUNTER — Ambulatory Visit (INDEPENDENT_AMBULATORY_CARE_PROVIDER_SITE_OTHER): Payer: PPO | Admitting: *Deleted

## 2015-11-30 DIAGNOSIS — Z5181 Encounter for therapeutic drug level monitoring: Secondary | ICD-10-CM

## 2015-11-30 DIAGNOSIS — I4891 Unspecified atrial fibrillation: Secondary | ICD-10-CM

## 2015-11-30 LAB — POCT INR: INR: 1.1

## 2015-11-30 NOTE — Telephone Encounter (Signed)
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    Will make referral to medical oncology.

## 2015-11-30 NOTE — Progress Notes (Signed)
Pre visit review using our clinic review tool, if applicable. No additional management support is needed unless otherwise documented below in the visit note. 

## 2015-11-30 NOTE — Progress Notes (Signed)
INR is sub therapeutic today.  Patient underwent breast surgery on 3/27 and Coumadin was held x5 days prior to that procedure.  She restarted on 3/28.  No other missed doses or medication changes.  Will boost x2 days then continue as previously scheduled.  Patient encouraged to avoid greens for the next 3-4 days.  Will recheck in 2 weeks and make additional adjustments at that time if needed.

## 2015-12-03 NOTE — Progress Notes (Signed)
Noted and agree with plan.

## 2015-12-06 ENCOUNTER — Encounter: Payer: Self-pay | Admitting: Hematology

## 2015-12-06 ENCOUNTER — Telehealth: Payer: Self-pay | Admitting: Hematology

## 2015-12-06 NOTE — Telephone Encounter (Signed)
Spoke with patient re new pt appointment with Dr. Burr Medico 12/12/15 @ 11 am to arrive 10:30 am. Demographic insurance information confirmed. Message to navigators.

## 2015-12-07 ENCOUNTER — Telehealth: Payer: Self-pay | Admitting: *Deleted

## 2015-12-07 NOTE — Telephone Encounter (Signed)
Received appt date/time from Audie Clear.  Unable to mail packet.  Placed a note for an intake form to be given at time of check in.

## 2015-12-11 ENCOUNTER — Telehealth: Payer: Self-pay | Admitting: Hematology

## 2015-12-11 NOTE — Telephone Encounter (Signed)
Per navigator, KM cxd 4/11 new patient appointment with YF. Per navigator patient will go to [facility].

## 2015-12-12 ENCOUNTER — Ambulatory Visit: Payer: Self-pay | Admitting: Hematology

## 2015-12-14 ENCOUNTER — Ambulatory Visit (INDEPENDENT_AMBULATORY_CARE_PROVIDER_SITE_OTHER): Payer: PPO | Admitting: *Deleted

## 2015-12-14 DIAGNOSIS — Z5181 Encounter for therapeutic drug level monitoring: Secondary | ICD-10-CM | POA: Diagnosis not present

## 2015-12-14 DIAGNOSIS — I4891 Unspecified atrial fibrillation: Secondary | ICD-10-CM

## 2015-12-14 LAB — POCT INR: INR: 1.9

## 2015-12-14 NOTE — Progress Notes (Signed)
Pre visit review using our clinic review tool, if applicable. No additional management support is needed unless otherwise documented below in the visit note. 

## 2015-12-19 DIAGNOSIS — G4733 Obstructive sleep apnea (adult) (pediatric): Secondary | ICD-10-CM | POA: Diagnosis not present

## 2015-12-19 DIAGNOSIS — S8390XA Sprain of unspecified site of unspecified knee, initial encounter: Secondary | ICD-10-CM | POA: Diagnosis not present

## 2015-12-20 ENCOUNTER — Encounter: Payer: Self-pay | Admitting: Internal Medicine

## 2015-12-26 ENCOUNTER — Encounter: Payer: Self-pay | Admitting: Oncology

## 2015-12-26 ENCOUNTER — Encounter: Payer: Self-pay | Admitting: *Deleted

## 2015-12-26 ENCOUNTER — Inpatient Hospital Stay: Payer: PPO | Attending: Oncology | Admitting: Oncology

## 2015-12-26 VITALS — BP 157/80 | HR 53 | Temp 97.7°F | Resp 20 | Ht 66.14 in | Wt 284.8 lb

## 2015-12-26 DIAGNOSIS — D0511 Intraductal carcinoma in situ of right breast: Secondary | ICD-10-CM | POA: Insufficient documentation

## 2015-12-26 DIAGNOSIS — Z7901 Long term (current) use of anticoagulants: Secondary | ICD-10-CM | POA: Insufficient documentation

## 2015-12-26 DIAGNOSIS — G4730 Sleep apnea, unspecified: Secondary | ICD-10-CM | POA: Diagnosis not present

## 2015-12-26 DIAGNOSIS — E785 Hyperlipidemia, unspecified: Secondary | ICD-10-CM | POA: Diagnosis not present

## 2015-12-26 DIAGNOSIS — I4891 Unspecified atrial fibrillation: Secondary | ICD-10-CM | POA: Insufficient documentation

## 2015-12-26 DIAGNOSIS — R0602 Shortness of breath: Secondary | ICD-10-CM | POA: Insufficient documentation

## 2015-12-26 DIAGNOSIS — I10 Essential (primary) hypertension: Secondary | ICD-10-CM

## 2015-12-26 DIAGNOSIS — Z79899 Other long term (current) drug therapy: Secondary | ICD-10-CM | POA: Diagnosis not present

## 2015-12-26 DIAGNOSIS — Z9989 Dependence on other enabling machines and devices: Secondary | ICD-10-CM | POA: Diagnosis not present

## 2015-12-26 DIAGNOSIS — E039 Hypothyroidism, unspecified: Secondary | ICD-10-CM | POA: Diagnosis not present

## 2015-12-26 DIAGNOSIS — K219 Gastro-esophageal reflux disease without esophagitis: Secondary | ICD-10-CM | POA: Insufficient documentation

## 2015-12-26 NOTE — Progress Notes (Signed)
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

## 2015-12-26 NOTE — Progress Notes (Signed)
  Oncology Nurse Navigator Documentation  Navigator Location: [facility]Med [facility] (12/26/15 1200) Navigator Encounter Type: Initial MedOnc (12/26/15 1200)   Abnormal Finding Date: 10/26/15 (12/26/15 1200) Confirmed Diagnosis Date: 11/27/15 (12/26/15 1200) Surgery Date: 11/27/15 (12/26/15 1200) Treatment Initiated Date: 11/27/15 (12/26/15 1200) Patient Visit Type: MedOnc (12/26/15 1200)   Barriers/Navigation Needs: Education;Coordination of Care (12/26/15 1200) Education: Understanding Cancer/ Treatment Options;Coping with Diagnosis/ Prognosis;Newly Diagnosed Cancer Education (12/26/15 1200) Interventions: Coordination of Care (12/26/15 1200)            Acuity: Level 1 (12/26/15 1200)         Time Spent with Patient: 90 (12/26/15 1200)   Met patient and her daughter today during her initial medical oncology visit with Dr. Grayland Ormond.  Patient had her mammogram and surgery in [place].  Was referred to Medical Oncology here per her request because it is closer to home.  She lives in a senior living housing in [place].  Gave patient breast cancer educational literature, "My Breast Cancer Treatment Handbook" by Josephine Igo, RN.  Offered support.  Reviewed resources available at the cancer center.  She is to call if she has any questions or needs.

## 2015-12-31 DIAGNOSIS — D0511 Intraductal carcinoma in situ of right breast: Secondary | ICD-10-CM | POA: Insufficient documentation

## 2015-12-31 NOTE — Progress Notes (Signed)
[name]  [phone] [phone]  ID: Mckenzie Frank OB: 1941-03-15  MR#: [id number]  [id number]  Patient Care Team: Ria Bush, MD as PCP - General (Family Medicine)  CHIEF COMPLAINT:  Chief Complaint  Patient presents with  . New Evaluation    DCIS    INTERVAL HISTORY: Patient is a 75 year old female who recently had bloody discharge from her right nipple. Subsequent mammogram on October 26, 2015 was reported as normal. A "ductogram" on the same day was abnormal and biopsy/lumpectomy was recommended. 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 She currently feels well and is back to her baseline. She has no neurologic complaints. She denies any recent fevers or illnesses. She has good appetite and denies weight loss. She has no chest pain or shortness of breath. She denies any nausea, vomiting, constipation, or diarrhea. She has no urinary complaints. Patient feels at her baseline and offers no specific complaints today.  REVIEW OF SYSTEMS:   Review of Systems  Constitutional: Negative.  Negative for fever, weight loss and malaise/fatigue.  Respiratory: Negative.  Negative for cough and shortness of breath.   Cardiovascular: Negative.  Negative for chest pain.  Gastrointestinal: Negative.   Genitourinary: Negative.   Musculoskeletal: Negative.   Neurological: Negative.  Negative for weakness.  Psychiatric/Behavioral: Negative.     As per HPI. Otherwise, a complete review of systems is negatve.  PAST MEDICAL HISTORY: Past Medical History  Diagnosis Date  . Hyperlipidemia   . Hypertension   . Hypothyroidism     borderline  . Osteoarthritis of knee     s/p replacement  . Abnormal mammogram     left medial breast - benign fat necrosis with residual calcification  . Benign head tremor   . Atrial fibrillation ([place]) 2010    s/p DC cardioversion. on coumadin  . Insomnia   . GERD  (gastroesophageal reflux disease)   . Chronic bronchitis ([place])     "used to get it alot; not so much anymore" (08/27/2013)  . Rectus sheath hematoma 08/27/2013    R after bridging with lovenox  . H/O hiatal hernia     moderate sized/notes  . History of diverticulitis of colon 08/2013    sigmoid with possible microperf/abscess s/p hospitalization complicated by rectus sheath hematoma during lovenox bridge, leading to ARF from hematoma compression on kidney  . RSV bronchitis 09/13/2013  . Dysrhythmia     a-fib, now NSR  . Shortness of breath dyspnea   . Sleep apnea     states she wears her CPAP nightly  . Last menstrual period (LMP) > 10 days ago 1971    PAST SURGICAL HISTORY: Past Surgical History  Procedure Laterality Date  . Replacement total knee Bilateral 2005  . Appendectomy  1947  . Dilation and curettage of uterus    . Cholecystectomy  1980  . Cardiac catheterization      [facility];Dr. Clayborn Bigness  . Dexa  10/2012    WNL  . Tonsillectomy    . Joint replacement    . Abdominal exploration surgery  1971    "to see if I needed hysterectomy; dr thought qthing looked too good so he didn't do a hysterectomy at this time" (08/27/2013)  . Vaginal hysterectomy  1971    "2 wks after 1st dr said it was ok" (08/27/2013)  . Cardioversion      [facility] Endo 08/27/2013  . Percutaneous nephrostomy  08/2013    ARF from hematoma  compression  . Electrophysiologic study N/A 05/25/2015    Procedure: Cardioversion;  Surgeon: Wellington Hampshire, MD;  Location: [facility];  Service: Cardiovascular;  Laterality: N/A;  . Breast ductal system excision Right 11/27/2015    Procedure: RIGHT  MAJOR DUCT  EXCISION;  Surgeon: Stark Klein, MD;  Location: [place];  Service: General;  Laterality: Right;    FAMILY HISTORY Family History  Problem Relation Age of Onset  . CAD Mother     angina  . Cancer Paternal Grandfather     ?  Marland Kitchen Alzheimer's disease Father     died from PNA  . Diabetes Mother   .  Stroke Mother 28       ADVANCED DIRECTIVES:    HEALTH MAINTENANCE: Social History  Substance Use Topics  . Smoking status: Never Smoker   . Smokeless tobacco: Never Used  . Alcohol Use: No     Colonoscopy:  PAP:  Bone density:  Lipid panel:  Allergies  Allergen Reactions  . Paxil [Paroxetine Hcl] Other (See Comments)    Shaking   . Clindamycin/Lincomycin Other (See Comments)    unknown  . Codeine Other (See Comments)    unknown  . Doxycycline Other (See Comments)    unknown  . Promethazine Other (See Comments)    unknown  . Sulfa Antibiotics Hives  . Valium Other (See Comments)    unknown    Current Outpatient Prescriptions  Medication Sig Dispense Refill  . acetaminophen (TYLENOL) 500 MG tablet Take 1,000 mg by mouth every 6 (six) hours as needed for moderate pain.    Marland Kitchen amLODipine (NORVASC) 10 MG tablet Take 1 tablet (10 mg total) by mouth daily. 90 tablet 1  . clonazePAM (KLONOPIN) 0.5 MG tablet TAKE 1 TABLET BY MOUTH EVERY NIGHT AT BEDTIME 30 tablet 0  . flecainide (TAMBOCOR) 150 MG tablet Take 1 tablet (150 mg total) by mouth 2 (two) times daily. 180 tablet 3  . furosemide (LASIX) 40 MG tablet Take 40 mg by mouth 2 (two) times daily.    Marland Kitchen HYDROcodone-acetaminophen (NORCO) 5-325 MG tablet Take 0.5-1 tablets by mouth every 4 (four) hours as needed for moderate pain or severe pain. 15 tablet 0  . lansoprazole (PREVACID) 15 MG capsule Take 1 capsule (15 mg total) by mouth daily. 90 capsule 1  . levothyroxine (SYNTHROID, LEVOTHROID) 25 MCG tablet Take 1 tablet (25 mcg total) by mouth daily. 90 tablet 1  . losartan (COZAAR) 50 MG tablet Take 1 tablet (50 mg total) by mouth daily. 90 tablet 1  . metoprolol tartrate (LOPRESSOR) 25 MG tablet Take 1 tablet (25 mg total) by mouth 2 (two) times daily. 180 tablet 3  . primidone (MYSOLINE) 50 MG tablet Take 1 tablet (50 mg total) by mouth at bedtime. (Patient taking differently: Take 25 mg by mouth at bedtime. ) 90 tablet 0  .  simvastatin (ZOCOR) 20 MG tablet Take 0.5 tablets (10 mg total) by mouth every evening. 45 tablet 1  . traZODone (DESYREL) 50 MG tablet Take 0.5-1 tablets (25-50 mg total) by mouth at bedtime. 30 tablet 3  . VITAMIN D, CHOLECALCIFEROL, PO Take 500 mg by mouth daily.    Marland Kitchen warfarin (COUMADIN) 5 MG tablet Take 1 tablet (5 mg total) by mouth as directed. (Patient taking differently: Take 5 mg by mouth as directed. 5 mg daily and then one-half on Friday) 90 tablet 1   No current facility-administered medications for this visit.    OBJECTIVE: Filed Vitals:  12/26/15 1020  BP: 157/80  Pulse: 53  Temp: 97.7 F (36.5 C)  Resp: 20     Body mass index is 45.78 kg/(m^2).    ECOG FS:0 - Asymptomatic  General: Well-developed, well-nourished, no acute distress. Eyes: Pink conjunctiva, anicteric sclera. HEENT: Normocephalic, moist mucous membranes, clear oropharnyx. Breasts: Patient requested exam be deferred today. Lungs: Clear to auscultation bilaterally. Heart: Regular rate and rhythm. No rubs, murmurs, or gallops. Abdomen: Soft, nontender, nondistended. No organomegaly noted, normoactive bowel sounds. Musculoskeletal: No edema, cyanosis, or clubbing. Neuro: Alert, answering all questions appropriately. Cranial nerves grossly intact. Skin: No rashes or petechiae noted. Psych: Normal affect. Lymphatics: No cervical, calvicular, axillary or inguinal LAD.   LAB RESULTS:  Lab Results  Component Value Date   NA 138 11/27/2015   K 3.7 11/27/2015   CL 101 11/27/2015   CO2 28 11/24/2015   GLUCOSE 104* 11/27/2015   BUN 20 11/27/2015   CREATININE 0.70 11/27/2015   CALCIUM 9.3 11/24/2015   PROT 7.2 04/17/2015   ALBUMIN 4.3 04/17/2015   AST 18 04/17/2015   ALT 21 04/17/2015   ALKPHOS 112 04/17/2015   BILITOT 1.0 04/17/2015   GFRNONAA >60 11/24/2015   GFRAA >60 11/24/2015    Lab Results  Component Value Date   WBC 6.5 11/24/2015   NEUTROABS 4.4 11/24/2015   HGB 14.6 11/27/2015   HCT  43.0 11/27/2015   MCV 87.4 11/24/2015   PLT 276 11/24/2015     STUDIES: No results found.  ASSESSMENT: DCIS right breast.  PLAN:    1. DCIS: Patient has now completed her lumpectomy and will benefit from adjuvant XRT. A referral was made to radiation oncology and patient has an appointment in the next 1-2 weeks. Given there was no invasive component, patient does not require chemotherapy. She will require tamoxifen for total 5 years which can be initiated at the conclusion of her XRT. Return to clinic in 3 months at approximately the end of her XRT to initiate tamoxifen.  Approximately 45 minutes was spent in discussion of which greater than 50% was consultation.  Patient expressed understanding and was in agreement with this plan. She also understands that She can call clinic at any time with any questions, concerns, or complaints.   Lloyd Huger, MD   12/31/2015 8:06 AM

## 2016-01-01 ENCOUNTER — Other Ambulatory Visit: Payer: Self-pay | Admitting: Family Medicine

## 2016-01-02 ENCOUNTER — Other Ambulatory Visit: Payer: Self-pay

## 2016-01-02 MED ORDER — FLUTICASONE PROPIONATE 50 MCG/ACT NA SUSP: NASAL | Status: AC

## 2016-01-02 NOTE — Telephone Encounter (Signed)
[place] left v/m requesting refill flonase. Last annual 04/27/15. Refilled per protocol.

## 2016-01-03 ENCOUNTER — Encounter: Payer: Self-pay | Admitting: Internal Medicine

## 2016-01-05 ENCOUNTER — Encounter: Payer: Self-pay | Admitting: Internal Medicine

## 2016-01-05 ENCOUNTER — Ambulatory Visit (INDEPENDENT_AMBULATORY_CARE_PROVIDER_SITE_OTHER): Payer: PPO | Admitting: Internal Medicine

## 2016-01-05 VITALS — BP 150/82 | HR 65 | Ht 67.5 in | Wt 286.0 lb

## 2016-01-05 DIAGNOSIS — G4733 Obstructive sleep apnea (adult) (pediatric): Secondary | ICD-10-CM | POA: Diagnosis not present

## 2016-01-05 NOTE — Patient Instructions (Signed)
--  Continue using your cpap every night.

## 2016-01-05 NOTE — Progress Notes (Signed)
* [facility]     Assessment and Plan:  Obstructive sleep apnea. -Mild OSA with an AHI of 9, adequately treated with CPAP. 9.  Excessive daytime sleepiness. -Secondary to above, now improved since started on CPAP.  Atrial fibrillation. --Continue to follow with cardiology.    Date: 01/05/2016  MRN# [id number] Mckenzie Frank 02/21/1941   Mckenzie Frank is a 75 y.o. old female seen in follow up for chief complaint of  Chief Complaint  Patient presents with  . Follow-up    pt states she wears CPAP 6-7hr nightly. feels pressure is good. mask is causing bags under eyes. no supplies needed.  DME:[facility]     HPI:   The patient is a 75 year old female at last visit it was noted that she was having excessive daytime sleepiness, and addition, she was having trouble with controlling her atrial fibrillation. She was noted to have been diagnosed with obstructive sleep apnea in the past, opted not to start on the Pap at that time.  She is sleeping with her nasal mask every night, and feels that she is doing well with it, and notes that she is no longer snoring. She has trouble adjusting to her mask and feels that it is causing some bags under her eyes, but otherwise she is now tolerating it well.  Her BP is well controlled. She is continuing to follow with cardiology in terms of her atrial fib. She is about to start getting radiation for breast DCIS.   Review of home sleep study tracings 12/7/616: AHI of 9 per hour, consistent with mild OSA. Review of CPAP titration study performed on 08/30/15: CPAP of 9 was recommended. Review of compliance report today, average uses his 7 hours and 5 minutes. CPAP set at 9 residual AHI is 0.6. Overall, this shows excellent compliance with good control of OSA.    Medication:   Outpatient Encounter Prescriptions as of 01/05/2016  Medication Sig  . acetaminophen (TYLENOL) 500 MG tablet Take 1,000 mg by mouth every 6 (six) hours as needed for  moderate pain.  Marland Kitchen amLODipine (NORVASC) 10 MG tablet Take 1 tablet (10 mg total) by mouth daily.  . clonazePAM (KLONOPIN) 0.5 MG tablet TAKE 1 TABLET BY MOUTH EVERY NIGHT AT BEDTIME  . flecainide (TAMBOCOR) 150 MG tablet Take 1 tablet (150 mg total) by mouth 2 (two) times daily.  . fluticasone (FLONASE) 50 MCG/ACT nasal spray USE 2 SPARYS IN EACH NOSTRIL DAILY  . furosemide (LASIX) 40 MG tablet Take 40 mg by mouth 2 (two) times daily.  Marland Kitchen HYDROcodone-acetaminophen (NORCO) 5-325 MG tablet Take 0.5-1 tablets by mouth every 4 (four) hours as needed for moderate pain or severe pain.  Marland Kitchen lansoprazole (PREVACID) 15 MG capsule Take 1 capsule (15 mg total) by mouth daily.  Marland Kitchen levothyroxine (SYNTHROID, LEVOTHROID) 25 MCG tablet Take 1 tablet (25 mcg total) by mouth daily.  Marland Kitchen losartan (COZAAR) 50 MG tablet Take 1 tablet (50 mg total) by mouth daily.  Marland Kitchen losartan (COZAAR) 50 MG tablet TAKE 1 TABLET BY MOUTH ONCE A DAY  . metoprolol tartrate (LOPRESSOR) 25 MG tablet Take 1 tablet (25 mg total) by mouth 2 (two) times daily.  . primidone (MYSOLINE) 50 MG tablet Take 1 tablet (50 mg total) by mouth at bedtime. (Patient taking differently: Take 25 mg by mouth at bedtime. )  . simvastatin (ZOCOR) 20 MG tablet Take 0.5 tablets (10 mg total) by mouth every evening.  . traZODone (DESYREL) 50 MG tablet Take  0.5-1 tablets (25-50 mg total) by mouth at bedtime.  Marland Kitchen VITAMIN D, CHOLECALCIFEROL, PO Take 500 mg by mouth daily.  Marland Kitchen warfarin (COUMADIN) 5 MG tablet Take 1 tablet (5 mg total) by mouth as directed. (Patient taking differently: Take 5 mg by mouth as directed. 5 mg daily and then one-half on Friday)   No facility-administered encounter medications on file as of 01/05/2016.     Allergies:  Paxil; Clindamycin/lincomycin; Codeine; Doxycycline; Promethazine; Sulfa antibiotics; and Valium  Review of Systems: Gen:  Denies  fever, sweats. HEENT: Denies blurred vision. Cvc:  No dizziness, chest pain or heaviness Resp:    Denies cough or sputum porduction. Gi: Denies swallowing difficulty, stomach pain. constipation, bowel incontinence Gu:  Denies bladder incontinence, burning urine Ext:   No Joint pain, stiffness. Skin: No skin rash, easy bruising. Endoc:  No polyuria, polydipsia. Psych: No depression, insomnia. Other:  All other systems were reviewed and found to be negative other than what is mentioned in the HPI.   Physical Examination:   VS: BP 150/82 mmHg  Pulse 65  Ht 5' 7.5" (1.715 m)  Wt 286 lb (129.729 kg)  BMI 44.11 kg/m2  SpO2 94%  General Appearance: No distress  Neuro:without focal findings,  speech normal,  HEENT: PERRLA, EOM intact. Pulmonary: normal breath sounds, No wheezing.   CardiovascularNormal S1,S2.  No m/r/g.   Abdomen: Benign, Soft, non-tender. Renal:  No costovertebral tenderness  GU:  Not performed at this time. Endoc: No evident thyromegaly, no signs of acromegaly. Skin:   warm, no rash. Extremities: normal, no cyanosis, clubbing.   LABORATORY PANEL:   CBC No results for input(s): WBC, HGB, HCT, PLT in the last 168 hours. ------------------------------------------------------------------------------------------------------------------  Chemistries  No results for input(s): NA, K, CL, CO2, GLUCOSE, BUN, CREATININE, CALCIUM, MG, AST, ALT, ALKPHOS, BILITOT in the last 168 hours.  Invalid input(s): GFRCGP ------------------------------------------------------------------------------------------------------------------  Cardiac Enzymes No results for input(s): TROPONINI in the last 168 hours. ------------------------------------------------------------  RADIOLOGY:   No results found for this or any previous visit. Results for orders placed in visit on 03/09/01  DG Chest 2 View   Narrative FINDINGS HISTORY: CHEST PAIN. CHEST TWO VIEWS: COMPARISONS, NONE. 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  NO FOCAL CONSOLIDATION OR  OVERT CONGESTIVE HEART FAILURE IS IDENTIFIED.  CARDIOMEDIASTINAL CONTOURS ARE WITHIN THE RANGE OF NORMAL.  THERE IS ATELECTASIS OR SCARRING IN THE LEFT LUNG BASE AS WELL AS THE LEFT MID CHEST. MILD DEGENERATIVE CHANGES ARE SEEN THROUGHOUT THE THORACIC SPINE. IMPRESSION 1.  THERE IS NO RADIOGRAPHIC EVIDENCE OF ACUTE CARDIOPULMONARY PROCESS. 2.  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  IF THERE ARE PRIOR FILMS AVAILABLE FOR COMPARISON, THIS WOULD BE RECOMMENDED. 3.  SCARRING OR ATELECTASIS IN THE LEFT LOWER LOBE AS WELL AS THE LEFT MID LUNG REGION.   ------------------------------------------------------------------------------------------------------------------  Thank  you for allowing [facility] to assist in the care of your patient. Our recommendations are noted above.  Please contact us if we can be of further service.   Marda Stalker, MD.  [facility] Office Number: [phone]  Patricia Pesa, M.D.  Vilinda Boehringer, M.D.  Merton Border, M.D  01/05/2016

## 2016-01-10 ENCOUNTER — Ambulatory Visit: Payer: Self-pay | Admitting: Internal Medicine

## 2016-01-11 ENCOUNTER — Ambulatory Visit (INDEPENDENT_AMBULATORY_CARE_PROVIDER_SITE_OTHER): Payer: PPO | Admitting: *Deleted

## 2016-01-11 ENCOUNTER — Encounter: Payer: Self-pay | Admitting: Radiation Oncology

## 2016-01-11 ENCOUNTER — Ambulatory Visit
Admission: RE | Admit: 2016-01-11 | Discharge: 2016-01-11 | Disposition: A | Discharge status: Home / Self Care | Payer: PPO | Source: Ambulatory Visit | Attending: Radiation Oncology | Admitting: Radiation Oncology

## 2016-01-11 VITALS — BP 157/62 | HR 57 | Temp 94.9°F | Resp 20 | Wt 283.6 lb

## 2016-01-11 DIAGNOSIS — R0602 Shortness of breath: Secondary | ICD-10-CM | POA: Diagnosis not present

## 2016-01-11 DIAGNOSIS — I499 Cardiac arrhythmia, unspecified: Secondary | ICD-10-CM | POA: Diagnosis not present

## 2016-01-11 DIAGNOSIS — Z510 Encounter for antineoplastic radiation therapy: Secondary | ICD-10-CM | POA: Insufficient documentation

## 2016-01-11 DIAGNOSIS — Z170 Estrogen receptor positive status [ER+]: Secondary | ICD-10-CM | POA: Diagnosis not present

## 2016-01-11 DIAGNOSIS — I4891 Unspecified atrial fibrillation: Secondary | ICD-10-CM | POA: Diagnosis not present

## 2016-01-11 DIAGNOSIS — Z79899 Other long term (current) drug therapy: Secondary | ICD-10-CM | POA: Insufficient documentation

## 2016-01-11 DIAGNOSIS — D0511 Intraductal carcinoma in situ of right breast: Secondary | ICD-10-CM

## 2016-01-11 DIAGNOSIS — Z5181 Encounter for therapeutic drug level monitoring: Secondary | ICD-10-CM | POA: Diagnosis not present

## 2016-01-11 DIAGNOSIS — I10 Essential (primary) hypertension: Secondary | ICD-10-CM | POA: Diagnosis not present

## 2016-01-11 DIAGNOSIS — J42 Unspecified chronic bronchitis: Secondary | ICD-10-CM | POA: Insufficient documentation

## 2016-01-11 DIAGNOSIS — G4730 Sleep apnea, unspecified: Secondary | ICD-10-CM | POA: Diagnosis not present

## 2016-01-11 DIAGNOSIS — Z809 Family history of malignant neoplasm, unspecified: Secondary | ICD-10-CM | POA: Diagnosis not present

## 2016-01-11 DIAGNOSIS — E039 Hypothyroidism, unspecified: Secondary | ICD-10-CM | POA: Insufficient documentation

## 2016-01-11 DIAGNOSIS — Z7901 Long term (current) use of anticoagulants: Secondary | ICD-10-CM | POA: Insufficient documentation

## 2016-01-11 DIAGNOSIS — K219 Gastro-esophageal reflux disease without esophagitis: Secondary | ICD-10-CM | POA: Diagnosis not present

## 2016-01-11 DIAGNOSIS — Z8719 Personal history of other diseases of the digestive system: Secondary | ICD-10-CM | POA: Diagnosis not present

## 2016-01-11 DIAGNOSIS — E785 Hyperlipidemia, unspecified: Secondary | ICD-10-CM | POA: Diagnosis not present

## 2016-01-11 DIAGNOSIS — M1990 Unspecified osteoarthritis, unspecified site: Secondary | ICD-10-CM | POA: Insufficient documentation

## 2016-01-11 DIAGNOSIS — G4700 Insomnia, unspecified: Secondary | ICD-10-CM | POA: Insufficient documentation

## 2016-01-11 LAB — POCT INR: INR: 1.9

## 2016-01-11 NOTE — Consult Note (Signed)
Except an outstanding is perfect of Radiation Oncology NEW PATIENT EVALUATION  Name: Mckenzie Frank  MRN: [id number]  Date:   01/11/2016     DOB: 12-Feb-1941   This 75 y.o. 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  REFERRING PHYSICIAN: Ria Bush, MD  CHIEF COMPLAINT:  Chief Complaint  Patient presents with  . Breast Cancer    Pt is here for initial consultation of breast cancer.     DIAGNOSIS: The encounter diagnosis was DCIS (ductal carcinoma in situ) of breast, right.   PREVIOUS INVESTIGATIONS:  Mammograms and ultrasound and ductogram reviewed Clinical notes reviewed Pathology report reviewed  HPI: Patient is a 75 year old female who presented with a bloody discharge from her right nipple. She underwent mammogram although fairly unremarkable. Dark red nipple discharge was elicited during the time of mammogram. Ductogram was performed showing a beaded appearance of the discharging duct in the outer right breast in the 8 to 9:00 region which was concerning for papilloma or malignancy. She underwent wide local excision showing ductal carcinoma in situ an area approximately 1 cm with clear margins. I do not see formal report describing ER/PR status although will try to retrieve that. She's been seem a medical oncology will receive tamoxifen sure ER/PR status be positive. She is doing well postoperatively. She specifically denies breast tenderness cough or bone pain.  PLANNED TREATMENT REGIMEN: Whole breast radiation  PAST MEDICAL HISTORY:  has a past medical history of Hyperlipidemia; Hypertension; Hypothyroidism; Osteoarthritis of knee; Abnormal mammogram; Benign head tremor; Atrial fibrillation ([place]) (2010); Insomnia; GERD (gastroesophageal reflux disease); Chronic bronchitis ([place]); Rectus sheath hematoma (08/27/2013); H/O  hiatal hernia; History of diverticulitis of colon (08/2013); RSV bronchitis (09/13/2013); Dysrhythmia; Shortness of breath dyspnea; Sleep apnea; and Last menstrual period (LMP) > 10 days ago (1971).    PAST SURGICAL HISTORY:  Past Surgical History  Procedure Laterality Date  . Replacement total knee Bilateral 2005  . Appendectomy  1947  . Dilation and curettage of uterus    . Cholecystectomy  1980  . Cardiac catheterization      [facility];Dr. Clayborn Bigness  . Dexa  10/2012    WNL  . Tonsillectomy    . Joint replacement    . Abdominal exploration surgery  1971    "to see if I needed hysterectomy; dr thought qthing looked too good so he didn't do a hysterectomy at this time" (08/27/2013)  . Vaginal hysterectomy  1971    "2 wks after 1st dr said it was ok" (08/27/2013)  . Cardioversion      Archie Endo 08/27/2013  . Percutaneous nephrostomy  08/2013    ARF from hematoma compression  . Electrophysiologic study N/A 05/25/2015    Procedure: Cardioversion;  Surgeon: Wellington Hampshire, MD;  Location: [facility];  Service: Cardiovascular;  Laterality: N/A;  . Breast ductal system excision Right 11/27/2015    Procedure: RIGHT  MAJOR DUCT  EXCISION;  Surgeon: Stark Klein, MD;  Location: [facility];  Service: General;  Laterality: Right;    FAMILY HISTORY: family history includes Alzheimer's disease in her father; CAD in her mother; Cancer in her paternal grandfather; Diabetes in her mother; Stroke (age of onset: 55) in her mother.  SOCIAL HISTORY:  reports that she has never smoked. She has never used smokeless tobacco. She reports that she does not drink alcohol or use  illicit drugs.  ALLERGIES: Paxil; Clindamycin/lincomycin; Codeine; Doxycycline; Promethazine; Sulfa antibiotics; and Valium  MEDICATIONS:  Current Outpatient Prescriptions  Medication Sig Dispense Refill  . acetaminophen (TYLENOL) 500 MG tablet Take 1,000 mg by mouth every 6 (six) hours as needed for moderate pain.    Marland Kitchen  amLODipine (NORVASC) 10 MG tablet Take 1 tablet (10 mg total) by mouth daily. 90 tablet 1  . clonazePAM (KLONOPIN) 0.5 MG tablet TAKE 1 TABLET BY MOUTH EVERY NIGHT AT BEDTIME 30 tablet 0  . flecainide (TAMBOCOR) 150 MG tablet Take 1 tablet (150 mg total) by mouth 2 (two) times daily. 180 tablet 3  . fluticasone (FLONASE) 50 MCG/ACT nasal spray USE 2 SPARYS IN EACH NOSTRIL DAILY 16 g 6  . furosemide (LASIX) 40 MG tablet Take 40 mg by mouth 2 (two) times daily.    Marland Kitchen HYDROcodone-acetaminophen (NORCO) 5-325 MG tablet Take 0.5-1 tablets by mouth every 4 (four) hours as needed for moderate pain or severe pain. 15 tablet 0  . lansoprazole (PREVACID) 15 MG capsule Take 1 capsule (15 mg total) by mouth daily. 90 capsule 1  . levothyroxine (SYNTHROID, LEVOTHROID) 25 MCG tablet Take 1 tablet (25 mcg total) by mouth daily. 90 tablet 1  . losartan (COZAAR) 50 MG tablet TAKE 1 TABLET BY MOUTH ONCE A DAY 30 tablet 3  . metoprolol tartrate (LOPRESSOR) 25 MG tablet Take 1 tablet (25 mg total) by mouth 2 (two) times daily. 180 tablet 3  . primidone (MYSOLINE) 50 MG tablet Take 1 tablet (50 mg total) by mouth at bedtime. (Patient taking differently: Take 25 mg by mouth at bedtime. ) 90 tablet 0  . simvastatin (ZOCOR) 20 MG tablet Take 0.5 tablets (10 mg total) by mouth every evening. 45 tablet 1  . VITAMIN D, CHOLECALCIFEROL, PO Take 500 mg by mouth daily.    Marland Kitchen warfarin (COUMADIN) 5 MG tablet Take 1 tablet (5 mg total) by mouth as directed. (Patient taking differently: Take 5 mg by mouth as directed. 5 mg daily and then one-half on Friday) 90 tablet 1  . traZODone (DESYREL) 50 MG tablet Take 0.5-1 tablets (25-50 mg total) by mouth at bedtime. 30 tablet 3   No current facility-administered medications for this encounter.    ECOG PERFORMANCE STATUS:  0 - Asymptomatic  REVIEW OF SYSTEMS:  Patient denies any weight loss, fatigue, weakness, fever, chills or night sweats. Patient denies any loss of vision, blurred  vision. Patient denies any ringing  of the ears or hearing loss. No irregular heartbeat. Patient denies heart murmur or history of fainting. Patient denies any chest pain or pain radiating to her upper extremities. Patient denies any shortness of breath, difficulty breathing at night, cough or hemoptysis. Patient denies any swelling in the lower legs. Patient denies any nausea vomiting, vomiting of blood, or coffee ground material in the vomitus. Patient denies any stomach pain. Patient states has had normal bowel movements no significant constipation or diarrhea. Patient denies any dysuria, hematuria or significant nocturia. Patient denies any problems walking, swelling in the joints or loss of balance. Patient denies any skin changes, loss of hair or loss of weight. Patient denies any excessive worrying or anxiety or significant depression. Patient denies any problems with insomnia. Patient denies excessive thirst, polyuria, polydipsia. Patient denies any swollen glands, patient denies easy bruising or easy bleeding. Patient denies any recent infections, allergies or URI. Patient "s visual fields have not changed significantly in recent time.    PHYSICAL EXAM: BP 157/62 mmHg  Pulse 57  Temp(Src) 94.9 F (34.9 C)  Resp 20  Wt 283 lb 10 oz (128.65 kg) Well-developed obese female in NAD. She status post wide local excision of the right breast with curvilinear scar around the nipple areolar complex. No dominant mass or nodularity is noted in either breast in 2 positions examined. No axillary or supraclavicular adenopathy is identified. Patient has rather large pendulous breasts. Well-developed well-nourished patient in NAD. HEENT reveals PERLA, EOMI, discs not visualized.  Oral cavity is clear. No oral mucosal lesions are identified. Neck is clear without evidence of cervical or supraclavicular adenopathy. Lungs are clear to A&P. Cardiac examination is essentially unremarkable with regular rate and rhythm  without murmur rub or thrill. Abdomen is benign with no organomegaly or masses noted. Motor sensory and DTR levels are equal and symmetric in the upper and lower extremities. Cranial nerves II through XII are grossly intact. Proprioception is intact. No peripheral adenopathy or edema is identified. No motor or sensory levels are noted. Crude visual fields are within normal range.  LABORATORY DATA: Pathology reports reviewed    RADIOLOGY RESULTS: Mammograms and ductogram and previous ultrasound reviewed   IMPRESSION: DCIS of the right breast presenting as nipple discharge in 75 year old female status post wide local excision with clear margins for adjuvant whole breast radiation  PLAN: At this time based on the patient's large breast size would not consider hypofractionated course of treatment. Would plan on delivering 5000 cGy over 5 weeks to her right breast. Risks and benefits of treatment including skin reaction fatigue alteration of blood counts possible inclusion of some superficial lung all were described in detail to the patient. She seems to comprehend my treatment plan well. I personally set up and ordered CT simulation for early next week. We'll try to retrieve her ER/PR status as she probably will be candidate for tamoxifen should she be hormone receptor positive.  I would like to take this opportunity for allowing me to participate in the care of your patient.Armstead Peaks., MD

## 2016-01-11 NOTE — Progress Notes (Signed)
Pre visit review using our clinic review tool, if applicable. No additional management support is needed unless otherwise documented below in the visit note. 

## 2016-01-11 NOTE — Progress Notes (Signed)
Verbal and written education provided to patient and daughter.  Both verbalized understanding.  All questions answered to their satisfaction.  Approximately 10 minutes spent on education.

## 2016-01-11 NOTE — Progress Notes (Signed)
INR remains sub therapeutic.  Patient denies any missed doses, new medications or diet changes.  Will boost with 1 tablet tomorrow (she has already taken today's dose), then have her begin taking 1 tablet daily and a half a tablet on Mondays and Fridays only.  Patient encouraged to avoid greens for the next 2-3 days.  Will recheck in 4 weeks and make additional adjustments at that time if needed.

## 2016-01-16 ENCOUNTER — Ambulatory Visit
Admission: RE | Admit: 2016-01-16 | Discharge: 2016-01-16 | Disposition: A | Discharge status: Home / Self Care | Payer: PPO | Source: Ambulatory Visit | Attending: Radiation Oncology | Admitting: Radiation Oncology

## 2016-01-16 DIAGNOSIS — Z7901 Long term (current) use of anticoagulants: Secondary | ICD-10-CM | POA: Diagnosis not present

## 2016-01-16 DIAGNOSIS — Z510 Encounter for antineoplastic radiation therapy: Secondary | ICD-10-CM | POA: Diagnosis not present

## 2016-01-16 DIAGNOSIS — Z79899 Other long term (current) drug therapy: Secondary | ICD-10-CM | POA: Diagnosis not present

## 2016-01-16 DIAGNOSIS — Z8719 Personal history of other diseases of the digestive system: Secondary | ICD-10-CM | POA: Diagnosis not present

## 2016-01-16 DIAGNOSIS — E785 Hyperlipidemia, unspecified: Secondary | ICD-10-CM | POA: Diagnosis not present

## 2016-01-16 DIAGNOSIS — E039 Hypothyroidism, unspecified: Secondary | ICD-10-CM | POA: Diagnosis not present

## 2016-01-16 DIAGNOSIS — Z170 Estrogen receptor positive status [ER+]: Secondary | ICD-10-CM | POA: Diagnosis not present

## 2016-01-16 DIAGNOSIS — D0511 Intraductal carcinoma in situ of right breast: Secondary | ICD-10-CM | POA: Diagnosis not present

## 2016-01-16 DIAGNOSIS — M1990 Unspecified osteoarthritis, unspecified site: Secondary | ICD-10-CM | POA: Diagnosis not present

## 2016-01-16 DIAGNOSIS — R0602 Shortness of breath: Secondary | ICD-10-CM | POA: Diagnosis not present

## 2016-01-16 DIAGNOSIS — K219 Gastro-esophageal reflux disease without esophagitis: Secondary | ICD-10-CM | POA: Diagnosis not present

## 2016-01-16 DIAGNOSIS — I10 Essential (primary) hypertension: Secondary | ICD-10-CM | POA: Diagnosis not present

## 2016-01-16 DIAGNOSIS — I4891 Unspecified atrial fibrillation: Secondary | ICD-10-CM | POA: Diagnosis not present

## 2016-01-16 DIAGNOSIS — Z809 Family history of malignant neoplasm, unspecified: Secondary | ICD-10-CM | POA: Diagnosis not present

## 2016-01-16 DIAGNOSIS — G4700 Insomnia, unspecified: Secondary | ICD-10-CM | POA: Diagnosis not present

## 2016-01-16 DIAGNOSIS — I499 Cardiac arrhythmia, unspecified: Secondary | ICD-10-CM | POA: Diagnosis not present

## 2016-01-16 DIAGNOSIS — J42 Unspecified chronic bronchitis: Secondary | ICD-10-CM | POA: Diagnosis not present

## 2016-01-16 DIAGNOSIS — G4730 Sleep apnea, unspecified: Secondary | ICD-10-CM | POA: Diagnosis not present

## 2016-01-18 ENCOUNTER — Telehealth: Payer: Self-pay | Admitting: Family Medicine

## 2016-01-18 DIAGNOSIS — G4733 Obstructive sleep apnea (adult) (pediatric): Secondary | ICD-10-CM | POA: Diagnosis not present

## 2016-01-18 DIAGNOSIS — Z170 Estrogen receptor positive status [ER+]: Secondary | ICD-10-CM | POA: Diagnosis not present

## 2016-01-18 DIAGNOSIS — S8390XA Sprain of unspecified site of unspecified knee, initial encounter: Secondary | ICD-10-CM | POA: Diagnosis not present

## 2016-01-18 DIAGNOSIS — D0511 Intraductal carcinoma in situ of right breast: Secondary | ICD-10-CM | POA: Diagnosis not present

## 2016-01-18 DIAGNOSIS — Z510 Encounter for antineoplastic radiation therapy: Secondary | ICD-10-CM | POA: Diagnosis not present

## 2016-01-18 NOTE — Telephone Encounter (Signed)
Pt is requesting new prescriptions from [facility] for 3 medications, Furosemide 40 mg, Prevacid 15 mg, and Simvastatin 20 mg.  [facility] has not previously filled these medications for pt.  [facility] Number is [phone]

## 2016-01-19 ENCOUNTER — Other Ambulatory Visit: Payer: Self-pay | Admitting: *Deleted

## 2016-01-19 DIAGNOSIS — D0511 Intraductal carcinoma in situ of right breast: Secondary | ICD-10-CM

## 2016-01-19 MED ORDER — FUROSEMIDE 40 MG PO TABS: 40.0000 mg | ORAL_TABLET | Freq: Two times a day (BID) | ORAL | Status: DC

## 2016-01-19 MED ORDER — SIMVASTATIN 20 MG PO TABS: 10.0000 mg | ORAL_TABLET | Freq: Every evening | ORAL | Status: AC

## 2016-01-19 MED ORDER — LANSOPRAZOLE 15 MG PO CPDR: 15.0000 mg | DELAYED_RELEASE_CAPSULE | Freq: Every day | ORAL | Status: DC

## 2016-01-23 ENCOUNTER — Ambulatory Visit
Admission: RE | Admit: 2016-01-23 | Discharge: 2016-01-23 | Disposition: A | Discharge status: Home / Self Care | Payer: PPO | Source: Ambulatory Visit | Attending: Radiation Oncology | Admitting: Radiation Oncology

## 2016-01-23 DIAGNOSIS — Z170 Estrogen receptor positive status [ER+]: Secondary | ICD-10-CM | POA: Diagnosis not present

## 2016-01-23 DIAGNOSIS — Z510 Encounter for antineoplastic radiation therapy: Secondary | ICD-10-CM | POA: Diagnosis not present

## 2016-01-23 DIAGNOSIS — D0511 Intraductal carcinoma in situ of right breast: Secondary | ICD-10-CM | POA: Diagnosis not present

## 2016-01-24 ENCOUNTER — Ambulatory Visit
Admission: RE | Admit: 2016-01-24 | Discharge: 2016-01-24 | Disposition: A | Discharge status: Home / Self Care | Payer: PPO | Source: Ambulatory Visit | Attending: Radiation Oncology | Admitting: Radiation Oncology

## 2016-01-24 DIAGNOSIS — Z510 Encounter for antineoplastic radiation therapy: Secondary | ICD-10-CM | POA: Diagnosis not present

## 2016-01-25 ENCOUNTER — Ambulatory Visit: Payer: PPO

## 2016-01-25 ENCOUNTER — Ambulatory Visit
Admission: RE | Admit: 2016-01-25 | Discharge: 2016-01-25 | Disposition: A | Discharge status: Home / Self Care | Payer: PPO | Source: Ambulatory Visit | Attending: Radiation Oncology | Admitting: Radiation Oncology

## 2016-01-25 DIAGNOSIS — Z510 Encounter for antineoplastic radiation therapy: Secondary | ICD-10-CM | POA: Diagnosis not present

## 2016-01-26 ENCOUNTER — Ambulatory Visit: Payer: PPO

## 2016-01-26 ENCOUNTER — Ambulatory Visit
Admission: RE | Admit: 2016-01-26 | Discharge: 2016-01-26 | Disposition: A | Discharge status: Home / Self Care | Payer: PPO | Source: Ambulatory Visit | Attending: Radiation Oncology | Admitting: Radiation Oncology

## 2016-01-26 DIAGNOSIS — Z510 Encounter for antineoplastic radiation therapy: Secondary | ICD-10-CM | POA: Diagnosis not present

## 2016-01-30 ENCOUNTER — Ambulatory Visit
Admission: RE | Admit: 2016-01-30 | Discharge: 2016-01-30 | Disposition: A | Discharge status: Home / Self Care | Payer: PPO | Source: Ambulatory Visit | Attending: Radiation Oncology | Admitting: Radiation Oncology

## 2016-01-30 ENCOUNTER — Ambulatory Visit: Payer: PPO

## 2016-01-30 DIAGNOSIS — Z510 Encounter for antineoplastic radiation therapy: Secondary | ICD-10-CM | POA: Diagnosis not present

## 2016-01-31 ENCOUNTER — Ambulatory Visit: Payer: PPO

## 2016-01-31 ENCOUNTER — Other Ambulatory Visit: Payer: Self-pay | Admitting: Family Medicine

## 2016-01-31 ENCOUNTER — Ambulatory Visit
Admission: RE | Admit: 2016-01-31 | Discharge: 2016-01-31 | Disposition: A | Discharge status: Home / Self Care | Payer: PPO | Source: Ambulatory Visit | Attending: Radiation Oncology | Admitting: Radiation Oncology

## 2016-01-31 DIAGNOSIS — D0511 Intraductal carcinoma in situ of right breast: Secondary | ICD-10-CM | POA: Diagnosis not present

## 2016-01-31 DIAGNOSIS — Z170 Estrogen receptor positive status [ER+]: Secondary | ICD-10-CM | POA: Diagnosis not present

## 2016-01-31 DIAGNOSIS — Z510 Encounter for antineoplastic radiation therapy: Secondary | ICD-10-CM | POA: Diagnosis not present

## 2016-01-31 NOTE — Telephone Encounter (Signed)
Ok to refill 

## 2016-02-01 ENCOUNTER — Ambulatory Visit
Admission: RE | Admit: 2016-02-01 | Discharge: 2016-02-01 | Disposition: A | Discharge status: Home / Self Care | Payer: PPO | Source: Ambulatory Visit | Attending: Radiation Oncology | Admitting: Radiation Oncology

## 2016-02-01 ENCOUNTER — Ambulatory Visit: Payer: PPO

## 2016-02-01 DIAGNOSIS — Z510 Encounter for antineoplastic radiation therapy: Secondary | ICD-10-CM | POA: Diagnosis not present

## 2016-02-01 DIAGNOSIS — E039 Hypothyroidism, unspecified: Secondary | ICD-10-CM | POA: Diagnosis not present

## 2016-02-01 DIAGNOSIS — I499 Cardiac arrhythmia, unspecified: Secondary | ICD-10-CM | POA: Diagnosis not present

## 2016-02-01 DIAGNOSIS — G4700 Insomnia, unspecified: Secondary | ICD-10-CM | POA: Diagnosis not present

## 2016-02-01 DIAGNOSIS — I4891 Unspecified atrial fibrillation: Secondary | ICD-10-CM | POA: Diagnosis not present

## 2016-02-01 DIAGNOSIS — Z8719 Personal history of other diseases of the digestive system: Secondary | ICD-10-CM | POA: Diagnosis not present

## 2016-02-01 DIAGNOSIS — E785 Hyperlipidemia, unspecified: Secondary | ICD-10-CM | POA: Diagnosis not present

## 2016-02-01 DIAGNOSIS — I10 Essential (primary) hypertension: Secondary | ICD-10-CM | POA: Diagnosis not present

## 2016-02-01 DIAGNOSIS — R0602 Shortness of breath: Secondary | ICD-10-CM | POA: Diagnosis not present

## 2016-02-01 DIAGNOSIS — Z809 Family history of malignant neoplasm, unspecified: Secondary | ICD-10-CM | POA: Diagnosis not present

## 2016-02-01 DIAGNOSIS — K219 Gastro-esophageal reflux disease without esophagitis: Secondary | ICD-10-CM | POA: Diagnosis not present

## 2016-02-01 DIAGNOSIS — D0511 Intraductal carcinoma in situ of right breast: Secondary | ICD-10-CM | POA: Diagnosis not present

## 2016-02-01 DIAGNOSIS — J42 Unspecified chronic bronchitis: Secondary | ICD-10-CM | POA: Diagnosis not present

## 2016-02-01 DIAGNOSIS — Z79899 Other long term (current) drug therapy: Secondary | ICD-10-CM | POA: Diagnosis not present

## 2016-02-01 DIAGNOSIS — Z7901 Long term (current) use of anticoagulants: Secondary | ICD-10-CM | POA: Diagnosis not present

## 2016-02-01 DIAGNOSIS — M1990 Unspecified osteoarthritis, unspecified site: Secondary | ICD-10-CM | POA: Diagnosis not present

## 2016-02-01 DIAGNOSIS — G4730 Sleep apnea, unspecified: Secondary | ICD-10-CM | POA: Diagnosis not present

## 2016-02-01 NOTE — Telephone Encounter (Signed)
plz phone in. 

## 2016-02-02 ENCOUNTER — Ambulatory Visit: Payer: PPO

## 2016-02-02 ENCOUNTER — Ambulatory Visit
Admission: RE | Admit: 2016-02-02 | Discharge: 2016-02-02 | Disposition: A | Discharge status: Home / Self Care | Payer: PPO | Source: Ambulatory Visit | Attending: Radiation Oncology | Admitting: Radiation Oncology

## 2016-02-02 DIAGNOSIS — Z510 Encounter for antineoplastic radiation therapy: Secondary | ICD-10-CM | POA: Diagnosis not present

## 2016-02-02 NOTE — Telephone Encounter (Signed)
Rx called in as directed.   

## 2016-02-05 ENCOUNTER — Ambulatory Visit: Payer: PPO

## 2016-02-05 ENCOUNTER — Ambulatory Visit
Admission: RE | Admit: 2016-02-05 | Discharge: 2016-02-05 | Disposition: A | Discharge status: Home / Self Care | Payer: PPO | Source: Ambulatory Visit | Attending: Radiation Oncology | Admitting: Radiation Oncology

## 2016-02-05 DIAGNOSIS — Z510 Encounter for antineoplastic radiation therapy: Secondary | ICD-10-CM | POA: Diagnosis not present

## 2016-02-06 ENCOUNTER — Ambulatory Visit: Payer: PPO

## 2016-02-06 ENCOUNTER — Ambulatory Visit
Admission: RE | Admit: 2016-02-06 | Discharge: 2016-02-06 | Disposition: A | Discharge status: Home / Self Care | Payer: PPO | Source: Ambulatory Visit | Attending: Radiation Oncology | Admitting: Radiation Oncology

## 2016-02-06 DIAGNOSIS — Z510 Encounter for antineoplastic radiation therapy: Secondary | ICD-10-CM | POA: Diagnosis not present

## 2016-02-07 ENCOUNTER — Ambulatory Visit: Payer: PPO

## 2016-02-07 ENCOUNTER — Ambulatory Visit
Admission: RE | Admit: 2016-02-07 | Discharge: 2016-02-07 | Disposition: A | Discharge status: Home / Self Care | Payer: PPO | Source: Ambulatory Visit | Attending: Radiation Oncology | Admitting: Radiation Oncology

## 2016-02-07 DIAGNOSIS — D0511 Intraductal carcinoma in situ of right breast: Secondary | ICD-10-CM | POA: Diagnosis not present

## 2016-02-07 DIAGNOSIS — Z510 Encounter for antineoplastic radiation therapy: Secondary | ICD-10-CM | POA: Diagnosis not present

## 2016-02-07 DIAGNOSIS — Z170 Estrogen receptor positive status [ER+]: Secondary | ICD-10-CM | POA: Diagnosis not present

## 2016-02-08 ENCOUNTER — Ambulatory Visit
Admission: RE | Admit: 2016-02-08 | Discharge: 2016-02-08 | Disposition: A | Discharge status: Home / Self Care | Payer: PPO | Source: Ambulatory Visit | Attending: Radiation Oncology | Admitting: Radiation Oncology

## 2016-02-08 ENCOUNTER — Ambulatory Visit: Payer: Self-pay

## 2016-02-08 ENCOUNTER — Inpatient Hospital Stay: Payer: PPO | Attending: Radiation Oncology

## 2016-02-08 ENCOUNTER — Other Ambulatory Visit: Payer: Self-pay

## 2016-02-08 ENCOUNTER — Ambulatory Visit: Payer: PPO

## 2016-02-08 ENCOUNTER — Ambulatory Visit (INDEPENDENT_AMBULATORY_CARE_PROVIDER_SITE_OTHER): Payer: PPO | Admitting: *Deleted

## 2016-02-08 DIAGNOSIS — Z5181 Encounter for therapeutic drug level monitoring: Secondary | ICD-10-CM

## 2016-02-08 DIAGNOSIS — D0511 Intraductal carcinoma in situ of right breast: Secondary | ICD-10-CM

## 2016-02-08 DIAGNOSIS — I4891 Unspecified atrial fibrillation: Secondary | ICD-10-CM | POA: Diagnosis not present

## 2016-02-08 DIAGNOSIS — Z510 Encounter for antineoplastic radiation therapy: Secondary | ICD-10-CM | POA: Diagnosis not present

## 2016-02-08 LAB — CBC
HCT: 44 % (ref 35.0–47.0)
HEMOGLOBIN: 14.8 g/dL (ref 12.0–16.0)
MCH: 28.4 pg (ref 26.0–34.0)
MCHC: 33.8 g/dL (ref 32.0–36.0)
MCV: 84.2 fL (ref 80.0–100.0)
Platelets: 230 10*3/uL (ref 150–440)
RBC: 5.22 MIL/uL — ABNORMAL HIGH (ref 3.80–5.20)
RDW: 14.8 % — ABNORMAL HIGH (ref 11.5–14.5)
WBC: 6.2 10*3/uL (ref 3.6–11.0)

## 2016-02-08 LAB — POCT INR: INR: 2.3

## 2016-02-08 NOTE — Progress Notes (Signed)
Pre visit review using our clinic review tool, if applicable. No additional management support is needed unless otherwise documented below in the visit note. 

## 2016-02-09 ENCOUNTER — Ambulatory Visit: Payer: PPO

## 2016-02-09 ENCOUNTER — Ambulatory Visit
Admission: RE | Admit: 2016-02-09 | Discharge: 2016-02-09 | Disposition: A | Discharge status: Home / Self Care | Payer: PPO | Source: Ambulatory Visit | Attending: Radiation Oncology | Admitting: Radiation Oncology

## 2016-02-09 DIAGNOSIS — Z510 Encounter for antineoplastic radiation therapy: Secondary | ICD-10-CM | POA: Diagnosis not present

## 2016-02-12 ENCOUNTER — Ambulatory Visit: Payer: PPO

## 2016-02-12 ENCOUNTER — Ambulatory Visit
Admission: RE | Admit: 2016-02-12 | Discharge: 2016-02-12 | Disposition: A | Discharge status: Home / Self Care | Payer: PPO | Source: Ambulatory Visit | Attending: Radiation Oncology | Admitting: Radiation Oncology

## 2016-02-12 DIAGNOSIS — Z510 Encounter for antineoplastic radiation therapy: Secondary | ICD-10-CM | POA: Diagnosis not present

## 2016-02-13 ENCOUNTER — Ambulatory Visit
Admission: RE | Admit: 2016-02-13 | Discharge: 2016-02-13 | Disposition: A | Discharge status: Home / Self Care | Payer: PPO | Source: Ambulatory Visit | Attending: Radiation Oncology | Admitting: Radiation Oncology

## 2016-02-13 ENCOUNTER — Ambulatory Visit: Payer: PPO

## 2016-02-13 DIAGNOSIS — Z510 Encounter for antineoplastic radiation therapy: Secondary | ICD-10-CM | POA: Diagnosis not present

## 2016-02-14 ENCOUNTER — Ambulatory Visit
Admission: RE | Admit: 2016-02-14 | Discharge: 2016-02-14 | Disposition: A | Discharge status: Home / Self Care | Payer: PPO | Source: Ambulatory Visit | Attending: Radiation Oncology | Admitting: Radiation Oncology

## 2016-02-14 ENCOUNTER — Ambulatory Visit: Payer: PPO

## 2016-02-14 DIAGNOSIS — Z170 Estrogen receptor positive status [ER+]: Secondary | ICD-10-CM | POA: Diagnosis not present

## 2016-02-14 DIAGNOSIS — Z510 Encounter for antineoplastic radiation therapy: Secondary | ICD-10-CM | POA: Diagnosis not present

## 2016-02-14 DIAGNOSIS — D0511 Intraductal carcinoma in situ of right breast: Secondary | ICD-10-CM | POA: Diagnosis not present

## 2016-02-15 ENCOUNTER — Ambulatory Visit
Admission: RE | Admit: 2016-02-15 | Discharge: 2016-02-15 | Disposition: A | Discharge status: Home / Self Care | Payer: PPO | Source: Ambulatory Visit | Attending: Radiation Oncology | Admitting: Radiation Oncology

## 2016-02-15 ENCOUNTER — Ambulatory Visit: Payer: PPO

## 2016-02-15 DIAGNOSIS — Z510 Encounter for antineoplastic radiation therapy: Secondary | ICD-10-CM | POA: Diagnosis not present

## 2016-02-16 ENCOUNTER — Ambulatory Visit: Payer: PPO

## 2016-02-16 ENCOUNTER — Ambulatory Visit
Admission: RE | Admit: 2016-02-16 | Discharge: 2016-02-16 | Disposition: A | Discharge status: Home / Self Care | Payer: PPO | Source: Ambulatory Visit | Attending: Radiation Oncology | Admitting: Radiation Oncology

## 2016-02-16 DIAGNOSIS — Z510 Encounter for antineoplastic radiation therapy: Secondary | ICD-10-CM | POA: Diagnosis not present

## 2016-02-18 DIAGNOSIS — S8390XA Sprain of unspecified site of unspecified knee, initial encounter: Secondary | ICD-10-CM | POA: Diagnosis not present

## 2016-02-18 DIAGNOSIS — G4733 Obstructive sleep apnea (adult) (pediatric): Secondary | ICD-10-CM | POA: Diagnosis not present

## 2016-02-19 ENCOUNTER — Ambulatory Visit
Admission: RE | Admit: 2016-02-19 | Discharge: 2016-02-19 | Disposition: A | Discharge status: Home / Self Care | Payer: PPO | Source: Ambulatory Visit | Attending: Radiation Oncology | Admitting: Radiation Oncology

## 2016-02-19 ENCOUNTER — Ambulatory Visit: Payer: PPO

## 2016-02-19 DIAGNOSIS — Z510 Encounter for antineoplastic radiation therapy: Secondary | ICD-10-CM | POA: Diagnosis not present

## 2016-02-20 ENCOUNTER — Ambulatory Visit
Admission: RE | Admit: 2016-02-20 | Discharge: 2016-02-20 | Disposition: A | Discharge status: Home / Self Care | Payer: PPO | Source: Ambulatory Visit | Attending: Radiation Oncology | Admitting: Radiation Oncology

## 2016-02-20 ENCOUNTER — Ambulatory Visit: Payer: PPO

## 2016-02-20 DIAGNOSIS — Z510 Encounter for antineoplastic radiation therapy: Secondary | ICD-10-CM | POA: Diagnosis not present

## 2016-02-20 DIAGNOSIS — D0511 Intraductal carcinoma in situ of right breast: Secondary | ICD-10-CM | POA: Diagnosis not present

## 2016-02-20 DIAGNOSIS — Z170 Estrogen receptor positive status [ER+]: Secondary | ICD-10-CM | POA: Diagnosis not present

## 2016-02-21 ENCOUNTER — Ambulatory Visit
Admission: RE | Admit: 2016-02-21 | Discharge: 2016-02-21 | Disposition: A | Discharge status: Home / Self Care | Payer: PPO | Source: Ambulatory Visit | Attending: Radiation Oncology | Admitting: Radiation Oncology

## 2016-02-21 ENCOUNTER — Ambulatory Visit: Payer: PPO

## 2016-02-21 DIAGNOSIS — D0511 Intraductal carcinoma in situ of right breast: Secondary | ICD-10-CM | POA: Diagnosis not present

## 2016-02-21 DIAGNOSIS — Z170 Estrogen receptor positive status [ER+]: Secondary | ICD-10-CM | POA: Diagnosis not present

## 2016-02-21 DIAGNOSIS — Z510 Encounter for antineoplastic radiation therapy: Secondary | ICD-10-CM | POA: Diagnosis not present

## 2016-02-22 ENCOUNTER — Ambulatory Visit: Payer: PPO

## 2016-02-22 ENCOUNTER — Ambulatory Visit
Admission: RE | Admit: 2016-02-22 | Discharge: 2016-02-22 | Disposition: A | Discharge status: Home / Self Care | Payer: PPO | Source: Ambulatory Visit | Attending: Radiation Oncology | Admitting: Radiation Oncology

## 2016-02-22 ENCOUNTER — Inpatient Hospital Stay: Payer: PPO

## 2016-02-22 DIAGNOSIS — D0511 Intraductal carcinoma in situ of right breast: Secondary | ICD-10-CM | POA: Diagnosis not present

## 2016-02-22 DIAGNOSIS — Z510 Encounter for antineoplastic radiation therapy: Secondary | ICD-10-CM | POA: Diagnosis not present

## 2016-02-22 LAB — CBC
HEMATOCRIT: 42.5 % (ref 35.0–47.0)
HEMOGLOBIN: 14.3 g/dL (ref 12.0–16.0)
MCH: 28.2 pg (ref 26.0–34.0)
MCHC: 33.6 g/dL (ref 32.0–36.0)
MCV: 84 fL (ref 80.0–100.0)
Platelets: 217 10*3/uL (ref 150–440)
RBC: 5.06 MIL/uL (ref 3.80–5.20)
RDW: 15.2 % — ABNORMAL HIGH (ref 11.5–14.5)
WBC: 6 10*3/uL (ref 3.6–11.0)

## 2016-02-23 ENCOUNTER — Ambulatory Visit: Payer: PPO

## 2016-02-23 ENCOUNTER — Ambulatory Visit
Admission: RE | Admit: 2016-02-23 | Discharge: 2016-02-23 | Disposition: A | Discharge status: Home / Self Care | Payer: PPO | Source: Ambulatory Visit | Attending: Radiation Oncology | Admitting: Radiation Oncology

## 2016-02-26 ENCOUNTER — Ambulatory Visit: Payer: PPO

## 2016-02-26 ENCOUNTER — Ambulatory Visit
Admission: RE | Admit: 2016-02-26 | Discharge: 2016-02-26 | Disposition: A | Discharge status: Home / Self Care | Payer: PPO | Source: Ambulatory Visit | Attending: Radiation Oncology | Admitting: Radiation Oncology

## 2016-02-26 DIAGNOSIS — Z510 Encounter for antineoplastic radiation therapy: Secondary | ICD-10-CM | POA: Diagnosis not present

## 2016-02-27 ENCOUNTER — Ambulatory Visit: Payer: PPO

## 2016-02-27 ENCOUNTER — Ambulatory Visit
Admission: RE | Admit: 2016-02-27 | Discharge: 2016-02-27 | Disposition: A | Discharge status: Home / Self Care | Payer: PPO | Source: Ambulatory Visit | Attending: Radiation Oncology | Admitting: Radiation Oncology

## 2016-02-27 ENCOUNTER — Ambulatory Visit: Admission: RE | Admit: 2016-02-27 | Payer: PPO | Source: Ambulatory Visit

## 2016-02-27 ENCOUNTER — Other Ambulatory Visit: Payer: Self-pay | Admitting: *Deleted

## 2016-02-27 ENCOUNTER — Other Ambulatory Visit: Payer: Self-pay

## 2016-02-27 DIAGNOSIS — Z170 Estrogen receptor positive status [ER+]: Secondary | ICD-10-CM | POA: Diagnosis not present

## 2016-02-27 DIAGNOSIS — Z510 Encounter for antineoplastic radiation therapy: Secondary | ICD-10-CM | POA: Diagnosis not present

## 2016-02-27 DIAGNOSIS — D0511 Intraductal carcinoma in situ of right breast: Secondary | ICD-10-CM | POA: Diagnosis not present

## 2016-02-27 MED ORDER — LEVOTHYROXINE SODIUM 25 MCG PO TABS: 25.0000 ug | ORAL_TABLET | Freq: Every day | ORAL | Status: AC

## 2016-02-27 MED ORDER — SILVER SULFADIAZINE 1 % EX CREA: 1.0000 "application " | TOPICAL_CREAM | Freq: Two times a day (BID) | CUTANEOUS | Status: DC

## 2016-02-27 NOTE — Telephone Encounter (Signed)
Todd at [facility] left v/m requesting refill levothyroxine 25 mcg. Last refilled # 90 x 1 on 10/28/2014. Last TSH 04/17/15 and last annual 04/27/15; last f/u appt 10/24/15. I spoke with pt and she has been taking levothyroxine 25 mcg daily and has not missed any meds. Pt said DR G is only doctor who has prescribed med.Please advise.

## 2016-02-28 ENCOUNTER — Ambulatory Visit
Admission: RE | Admit: 2016-02-28 | Discharge: 2016-02-28 | Disposition: A | Discharge status: Home / Self Care | Payer: PPO | Source: Ambulatory Visit | Attending: Radiation Oncology | Admitting: Radiation Oncology

## 2016-02-28 ENCOUNTER — Ambulatory Visit: Payer: PPO

## 2016-02-29 ENCOUNTER — Ambulatory Visit: Payer: PPO

## 2016-02-29 DIAGNOSIS — Z510 Encounter for antineoplastic radiation therapy: Secondary | ICD-10-CM | POA: Diagnosis not present

## 2016-03-01 ENCOUNTER — Ambulatory Visit: Payer: PPO

## 2016-03-01 DIAGNOSIS — Z510 Encounter for antineoplastic radiation therapy: Secondary | ICD-10-CM | POA: Diagnosis not present

## 2016-03-02 DIAGNOSIS — Z809 Family history of malignant neoplasm, unspecified: Secondary | ICD-10-CM | POA: Diagnosis not present

## 2016-03-02 DIAGNOSIS — I4891 Unspecified atrial fibrillation: Secondary | ICD-10-CM | POA: Diagnosis not present

## 2016-03-02 DIAGNOSIS — Z510 Encounter for antineoplastic radiation therapy: Secondary | ICD-10-CM | POA: Diagnosis present

## 2016-03-02 DIAGNOSIS — Z7901 Long term (current) use of anticoagulants: Secondary | ICD-10-CM | POA: Diagnosis not present

## 2016-03-02 DIAGNOSIS — Z8719 Personal history of other diseases of the digestive system: Secondary | ICD-10-CM | POA: Diagnosis not present

## 2016-03-02 DIAGNOSIS — E039 Hypothyroidism, unspecified: Secondary | ICD-10-CM | POA: Diagnosis not present

## 2016-03-02 DIAGNOSIS — I10 Essential (primary) hypertension: Secondary | ICD-10-CM | POA: Diagnosis not present

## 2016-03-02 DIAGNOSIS — G4730 Sleep apnea, unspecified: Secondary | ICD-10-CM | POA: Diagnosis not present

## 2016-03-02 DIAGNOSIS — E785 Hyperlipidemia, unspecified: Secondary | ICD-10-CM | POA: Diagnosis not present

## 2016-03-02 DIAGNOSIS — Z79899 Other long term (current) drug therapy: Secondary | ICD-10-CM | POA: Diagnosis not present

## 2016-03-02 DIAGNOSIS — R0602 Shortness of breath: Secondary | ICD-10-CM | POA: Diagnosis not present

## 2016-03-02 DIAGNOSIS — M1990 Unspecified osteoarthritis, unspecified site: Secondary | ICD-10-CM | POA: Diagnosis not present

## 2016-03-02 DIAGNOSIS — G4700 Insomnia, unspecified: Secondary | ICD-10-CM | POA: Diagnosis not present

## 2016-03-02 DIAGNOSIS — J42 Unspecified chronic bronchitis: Secondary | ICD-10-CM | POA: Diagnosis not present

## 2016-03-02 DIAGNOSIS — K219 Gastro-esophageal reflux disease without esophagitis: Secondary | ICD-10-CM | POA: Diagnosis not present

## 2016-03-02 DIAGNOSIS — D0511 Intraductal carcinoma in situ of right breast: Secondary | ICD-10-CM | POA: Diagnosis not present

## 2016-03-02 DIAGNOSIS — I499 Cardiac arrhythmia, unspecified: Secondary | ICD-10-CM | POA: Diagnosis not present

## 2016-03-04 ENCOUNTER — Ambulatory Visit: Payer: PPO

## 2016-03-04 ENCOUNTER — Ambulatory Visit
Admission: RE | Admit: 2016-03-04 | Discharge: 2016-03-04 | Disposition: A | Discharge status: Home / Self Care | Payer: PPO | Source: Ambulatory Visit | Attending: Radiation Oncology | Admitting: Radiation Oncology

## 2016-03-04 DIAGNOSIS — Z510 Encounter for antineoplastic radiation therapy: Secondary | ICD-10-CM | POA: Diagnosis not present

## 2016-03-04 DIAGNOSIS — M1990 Unspecified osteoarthritis, unspecified site: Secondary | ICD-10-CM | POA: Diagnosis not present

## 2016-03-04 DIAGNOSIS — I10 Essential (primary) hypertension: Secondary | ICD-10-CM | POA: Diagnosis not present

## 2016-03-04 DIAGNOSIS — K219 Gastro-esophageal reflux disease without esophagitis: Secondary | ICD-10-CM | POA: Diagnosis not present

## 2016-03-04 DIAGNOSIS — G4700 Insomnia, unspecified: Secondary | ICD-10-CM | POA: Diagnosis not present

## 2016-03-04 DIAGNOSIS — Z79899 Other long term (current) drug therapy: Secondary | ICD-10-CM | POA: Diagnosis not present

## 2016-03-04 DIAGNOSIS — Z8719 Personal history of other diseases of the digestive system: Secondary | ICD-10-CM | POA: Diagnosis not present

## 2016-03-04 DIAGNOSIS — E785 Hyperlipidemia, unspecified: Secondary | ICD-10-CM | POA: Diagnosis not present

## 2016-03-04 DIAGNOSIS — I499 Cardiac arrhythmia, unspecified: Secondary | ICD-10-CM | POA: Diagnosis not present

## 2016-03-04 DIAGNOSIS — Z170 Estrogen receptor positive status [ER+]: Secondary | ICD-10-CM | POA: Diagnosis not present

## 2016-03-04 DIAGNOSIS — R0602 Shortness of breath: Secondary | ICD-10-CM | POA: Diagnosis not present

## 2016-03-04 DIAGNOSIS — G4730 Sleep apnea, unspecified: Secondary | ICD-10-CM | POA: Diagnosis not present

## 2016-03-04 DIAGNOSIS — J42 Unspecified chronic bronchitis: Secondary | ICD-10-CM | POA: Diagnosis not present

## 2016-03-04 DIAGNOSIS — E039 Hypothyroidism, unspecified: Secondary | ICD-10-CM | POA: Diagnosis not present

## 2016-03-04 DIAGNOSIS — D0511 Intraductal carcinoma in situ of right breast: Secondary | ICD-10-CM | POA: Diagnosis not present

## 2016-03-04 DIAGNOSIS — I4891 Unspecified atrial fibrillation: Secondary | ICD-10-CM | POA: Diagnosis not present

## 2016-03-04 DIAGNOSIS — Z7901 Long term (current) use of anticoagulants: Secondary | ICD-10-CM | POA: Diagnosis not present

## 2016-03-04 DIAGNOSIS — Z809 Family history of malignant neoplasm, unspecified: Secondary | ICD-10-CM | POA: Diagnosis not present

## 2016-03-05 ENCOUNTER — Ambulatory Visit: Payer: PPO

## 2016-03-06 ENCOUNTER — Ambulatory Visit: Payer: PPO

## 2016-03-06 ENCOUNTER — Ambulatory Visit
Admission: RE | Admit: 2016-03-06 | Discharge: 2016-03-06 | Disposition: A | Discharge status: Home / Self Care | Payer: PPO | Source: Ambulatory Visit | Attending: Radiation Oncology | Admitting: Radiation Oncology

## 2016-03-06 DIAGNOSIS — Z510 Encounter for antineoplastic radiation therapy: Secondary | ICD-10-CM | POA: Diagnosis not present

## 2016-03-07 ENCOUNTER — Ambulatory Visit
Admission: RE | Admit: 2016-03-07 | Discharge: 2016-03-07 | Disposition: A | Discharge status: Home / Self Care | Payer: PPO | Source: Ambulatory Visit | Attending: Radiation Oncology | Admitting: Radiation Oncology

## 2016-03-07 ENCOUNTER — Ambulatory Visit: Payer: PPO

## 2016-03-07 ENCOUNTER — Other Ambulatory Visit: Payer: Self-pay

## 2016-03-07 ENCOUNTER — Inpatient Hospital Stay: Payer: PPO | Attending: Radiation Oncology

## 2016-03-07 DIAGNOSIS — D0511 Intraductal carcinoma in situ of right breast: Secondary | ICD-10-CM

## 2016-03-07 DIAGNOSIS — Z780 Asymptomatic menopausal state: Secondary | ICD-10-CM | POA: Insufficient documentation

## 2016-03-07 DIAGNOSIS — Z510 Encounter for antineoplastic radiation therapy: Secondary | ICD-10-CM | POA: Diagnosis not present

## 2016-03-07 DIAGNOSIS — G4730 Sleep apnea, unspecified: Secondary | ICD-10-CM | POA: Diagnosis not present

## 2016-03-07 DIAGNOSIS — Z7901 Long term (current) use of anticoagulants: Secondary | ICD-10-CM | POA: Insufficient documentation

## 2016-03-07 DIAGNOSIS — K219 Gastro-esophageal reflux disease without esophagitis: Secondary | ICD-10-CM | POA: Diagnosis not present

## 2016-03-07 DIAGNOSIS — E785 Hyperlipidemia, unspecified: Secondary | ICD-10-CM | POA: Insufficient documentation

## 2016-03-07 DIAGNOSIS — Z9989 Dependence on other enabling machines and devices: Secondary | ICD-10-CM | POA: Insufficient documentation

## 2016-03-07 DIAGNOSIS — Z79811 Long term (current) use of aromatase inhibitors: Secondary | ICD-10-CM | POA: Insufficient documentation

## 2016-03-07 DIAGNOSIS — Z170 Estrogen receptor positive status [ER+]: Secondary | ICD-10-CM | POA: Insufficient documentation

## 2016-03-07 DIAGNOSIS — I10 Essential (primary) hypertension: Secondary | ICD-10-CM | POA: Insufficient documentation

## 2016-03-07 DIAGNOSIS — Z79899 Other long term (current) drug therapy: Secondary | ICD-10-CM | POA: Diagnosis not present

## 2016-03-07 DIAGNOSIS — E039 Hypothyroidism, unspecified: Secondary | ICD-10-CM | POA: Insufficient documentation

## 2016-03-07 DIAGNOSIS — Z923 Personal history of irradiation: Secondary | ICD-10-CM | POA: Insufficient documentation

## 2016-03-07 DIAGNOSIS — I4891 Unspecified atrial fibrillation: Secondary | ICD-10-CM | POA: Insufficient documentation

## 2016-03-07 LAB — CBC
HCT: 41.7 % (ref 35.0–47.0)
Hemoglobin: 14.1 g/dL (ref 12.0–16.0)
MCH: 28.4 pg (ref 26.0–34.0)
MCHC: 33.9 g/dL (ref 32.0–36.0)
MCV: 83.8 fL (ref 80.0–100.0)
PLATELETS: 216 10*3/uL (ref 150–440)
RBC: 4.98 MIL/uL (ref 3.80–5.20)
RDW: 15 % — AB (ref 11.5–14.5)
WBC: 6 10*3/uL (ref 3.6–11.0)

## 2016-03-08 ENCOUNTER — Ambulatory Visit: Payer: PPO

## 2016-03-08 DIAGNOSIS — Z510 Encounter for antineoplastic radiation therapy: Secondary | ICD-10-CM | POA: Diagnosis not present

## 2016-03-11 ENCOUNTER — Ambulatory Visit
Admission: RE | Admit: 2016-03-11 | Discharge: 2016-03-11 | Disposition: A | Discharge status: Home / Self Care | Payer: PPO | Source: Ambulatory Visit | Attending: Radiation Oncology | Admitting: Radiation Oncology

## 2016-03-11 ENCOUNTER — Ambulatory Visit: Payer: PPO

## 2016-03-11 DIAGNOSIS — Z510 Encounter for antineoplastic radiation therapy: Secondary | ICD-10-CM | POA: Diagnosis not present

## 2016-03-12 ENCOUNTER — Ambulatory Visit: Payer: PPO

## 2016-03-12 DIAGNOSIS — Z510 Encounter for antineoplastic radiation therapy: Secondary | ICD-10-CM | POA: Diagnosis not present

## 2016-03-12 DIAGNOSIS — Z170 Estrogen receptor positive status [ER+]: Secondary | ICD-10-CM | POA: Diagnosis not present

## 2016-03-12 DIAGNOSIS — D0511 Intraductal carcinoma in situ of right breast: Secondary | ICD-10-CM | POA: Diagnosis not present

## 2016-03-13 ENCOUNTER — Ambulatory Visit
Admission: RE | Admit: 2016-03-13 | Discharge: 2016-03-13 | Disposition: A | Discharge status: Home / Self Care | Payer: PPO | Source: Ambulatory Visit | Attending: Radiation Oncology | Admitting: Radiation Oncology

## 2016-03-13 ENCOUNTER — Ambulatory Visit: Payer: PPO

## 2016-03-13 DIAGNOSIS — Z510 Encounter for antineoplastic radiation therapy: Secondary | ICD-10-CM | POA: Diagnosis not present

## 2016-03-14 ENCOUNTER — Ambulatory Visit
Admission: RE | Admit: 2016-03-14 | Discharge: 2016-03-14 | Disposition: A | Discharge status: Home / Self Care | Payer: PPO | Source: Ambulatory Visit | Attending: Radiation Oncology | Admitting: Radiation Oncology

## 2016-03-14 ENCOUNTER — Ambulatory Visit: Payer: PPO

## 2016-03-14 DIAGNOSIS — Z510 Encounter for antineoplastic radiation therapy: Secondary | ICD-10-CM | POA: Diagnosis not present

## 2016-03-15 ENCOUNTER — Ambulatory Visit: Payer: PPO

## 2016-03-15 ENCOUNTER — Ambulatory Visit
Admission: RE | Admit: 2016-03-15 | Discharge: 2016-03-15 | Disposition: A | Discharge status: Home / Self Care | Payer: PPO | Source: Ambulatory Visit | Attending: Radiation Oncology | Admitting: Radiation Oncology

## 2016-03-15 DIAGNOSIS — Z510 Encounter for antineoplastic radiation therapy: Secondary | ICD-10-CM | POA: Diagnosis not present

## 2016-03-18 ENCOUNTER — Ambulatory Visit: Payer: PPO

## 2016-03-18 ENCOUNTER — Ambulatory Visit
Admission: RE | Admit: 2016-03-18 | Discharge: 2016-03-18 | Disposition: A | Discharge status: Home / Self Care | Payer: PPO | Source: Ambulatory Visit | Attending: Radiation Oncology | Admitting: Radiation Oncology

## 2016-03-18 DIAGNOSIS — Z510 Encounter for antineoplastic radiation therapy: Secondary | ICD-10-CM | POA: Diagnosis not present

## 2016-03-19 ENCOUNTER — Ambulatory Visit
Admission: RE | Admit: 2016-03-19 | Discharge: 2016-03-19 | Disposition: A | Discharge status: Home / Self Care | Payer: PPO | Source: Ambulatory Visit | Attending: Radiation Oncology | Admitting: Radiation Oncology

## 2016-03-19 DIAGNOSIS — Z510 Encounter for antineoplastic radiation therapy: Secondary | ICD-10-CM | POA: Diagnosis not present

## 2016-03-19 DIAGNOSIS — G4733 Obstructive sleep apnea (adult) (pediatric): Secondary | ICD-10-CM | POA: Diagnosis not present

## 2016-03-19 DIAGNOSIS — D0511 Intraductal carcinoma in situ of right breast: Secondary | ICD-10-CM | POA: Diagnosis not present

## 2016-03-19 DIAGNOSIS — S8390XA Sprain of unspecified site of unspecified knee, initial encounter: Secondary | ICD-10-CM | POA: Diagnosis not present

## 2016-03-19 DIAGNOSIS — Z170 Estrogen receptor positive status [ER+]: Secondary | ICD-10-CM | POA: Diagnosis not present

## 2016-03-20 ENCOUNTER — Ambulatory Visit
Admission: RE | Admit: 2016-03-20 | Discharge: 2016-03-20 | Disposition: A | Discharge status: Home / Self Care | Payer: PPO | Source: Ambulatory Visit | Attending: Radiation Oncology | Admitting: Radiation Oncology

## 2016-03-20 DIAGNOSIS — Z510 Encounter for antineoplastic radiation therapy: Secondary | ICD-10-CM | POA: Diagnosis not present

## 2016-03-21 ENCOUNTER — Other Ambulatory Visit (INDEPENDENT_AMBULATORY_CARE_PROVIDER_SITE_OTHER): Payer: PPO

## 2016-03-21 DIAGNOSIS — Z5181 Encounter for therapeutic drug level monitoring: Secondary | ICD-10-CM

## 2016-03-21 DIAGNOSIS — I340 Nonrheumatic mitral (valve) insufficiency: Secondary | ICD-10-CM

## 2016-03-21 LAB — POCT INR: INR: 1.8

## 2016-03-26 ENCOUNTER — Inpatient Hospital Stay (HOSPITAL_BASED_OUTPATIENT_CLINIC_OR_DEPARTMENT_OTHER): Payer: PPO | Admitting: Oncology

## 2016-03-26 ENCOUNTER — Other Ambulatory Visit: Payer: Self-pay | Admitting: Family Medicine

## 2016-03-26 ENCOUNTER — Ambulatory Visit (INDEPENDENT_AMBULATORY_CARE_PROVIDER_SITE_OTHER): Payer: PPO

## 2016-03-26 VITALS — BP 149/73 | HR 62 | Temp 96.0°F | Resp 18 | Wt 288.5 lb

## 2016-03-26 VITALS — BP 160/80 | HR 58 | Temp 98.0°F | Ht 65.5 in | Wt 289.0 lb

## 2016-03-26 DIAGNOSIS — Z170 Estrogen receptor positive status [ER+]: Secondary | ICD-10-CM

## 2016-03-26 DIAGNOSIS — E039 Hypothyroidism, unspecified: Secondary | ICD-10-CM

## 2016-03-26 DIAGNOSIS — E785 Hyperlipidemia, unspecified: Secondary | ICD-10-CM

## 2016-03-26 DIAGNOSIS — Z79811 Long term (current) use of aromatase inhibitors: Secondary | ICD-10-CM | POA: Diagnosis not present

## 2016-03-26 DIAGNOSIS — E559 Vitamin D deficiency, unspecified: Secondary | ICD-10-CM

## 2016-03-26 DIAGNOSIS — I10 Essential (primary) hypertension: Secondary | ICD-10-CM

## 2016-03-26 DIAGNOSIS — Z780 Asymptomatic menopausal state: Secondary | ICD-10-CM

## 2016-03-26 DIAGNOSIS — I4891 Unspecified atrial fibrillation: Secondary | ICD-10-CM

## 2016-03-26 DIAGNOSIS — Z0000 Encounter for general adult medical examination without abnormal findings: Secondary | ICD-10-CM

## 2016-03-26 DIAGNOSIS — Z7901 Long term (current) use of anticoagulants: Secondary | ICD-10-CM

## 2016-03-26 DIAGNOSIS — Z9989 Dependence on other enabling machines and devices: Secondary | ICD-10-CM

## 2016-03-26 DIAGNOSIS — D0511 Intraductal carcinoma in situ of right breast: Secondary | ICD-10-CM | POA: Diagnosis not present

## 2016-03-26 DIAGNOSIS — Z923 Personal history of irradiation: Secondary | ICD-10-CM

## 2016-03-26 DIAGNOSIS — K219 Gastro-esophageal reflux disease without esophagitis: Secondary | ICD-10-CM

## 2016-03-26 DIAGNOSIS — Z79899 Other long term (current) drug therapy: Secondary | ICD-10-CM

## 2016-03-26 DIAGNOSIS — G4730 Sleep apnea, unspecified: Secondary | ICD-10-CM

## 2016-03-26 LAB — BASIC METABOLIC PANEL
BUN: 18 mg/dL (ref 6–23)
CHLORIDE: 102 meq/L (ref 96–112)
CO2: 32 meq/L (ref 19–32)
CREATININE: 1.05 mg/dL (ref 0.40–1.20)
Calcium: 9.5 mg/dL (ref 8.4–10.5)
GFR: 54.02 mL/min — ABNORMAL LOW (ref >60.00)
Glucose, Bld: 92 mg/dL (ref 70–99)
Potassium: 4.5 mEq/L (ref 3.5–5.1)
Sodium: 140 mEq/L (ref 135–145)

## 2016-03-26 LAB — LIPID PANEL
CHOL/HDL RATIO: 3
Cholesterol: 198 mg/dL (ref 0–200)
HDL: 62.2 mg/dL (ref >39.00)
LDL CALC: 103 mg/dL — AB (ref 0–99)
NonHDL: 135.52
TRIGLYCERIDES: 162 mg/dL — AB (ref 0.0–149.0)
VLDL: 32.4 mg/dL (ref 0.0–40.0)

## 2016-03-26 LAB — TSH: TSH: 2 u[IU]/mL (ref 0.35–4.50)

## 2016-03-26 LAB — VITAMIN D 25 HYDROXY (VIT D DEFICIENCY, FRACTURES): VITD: 32.08 ng/mL (ref 30.00–100.00)

## 2016-03-26 MED ORDER — CLONAZEPAM 0.5 MG PO TABS: 0.5000 mg | ORAL_TABLET | Freq: Every day | ORAL | 0 refills | Status: DC

## 2016-03-26 MED ORDER — LETROZOLE 2.5 MG PO TABS: 2.5000 mg | ORAL_TABLET | Freq: Every day | ORAL | 6 refills | Status: DC

## 2016-03-26 NOTE — Telephone Encounter (Signed)
Ok to refill 

## 2016-03-26 NOTE — Progress Notes (Signed)
Pre visit review using our clinic review tool, if applicable. No additional management support is needed unless otherwise documented below in the visit note. 

## 2016-03-26 NOTE — Progress Notes (Signed)
PCP notes:   Health maintenance:   No gaps identified or addressed   Abnormal screenings:   Hearing - failed   Patient concerns:  None   Nurse concerns: None    Next PCP appt:  04/05/2016 @ 0800

## 2016-03-26 NOTE — Progress Notes (Signed)
Subjective:   Mckenzie Frank is a 75 y.o. female who presents for Medicare Annual (Subsequent) preventive examination.  Review of Systems:  N/A Cardiac Risk Factors include: advanced age (>47men, >28 women);obesity (BMI >30kg/m2);dyslipidemia     Objective:     Vitals: BP (!) 160/80 (BP Location: Left Arm, Patient Position: Sitting, Cuff Size: Large)   Pulse (!) 58   Temp 98 F (36.7 C) (Oral)   Ht 5' 5.5" (1.664 m) Comment: no shoes  Wt 289 lb (131.1 kg)   BMI 47.36 kg/m   Body mass index is 47.36 kg/m.   Tobacco History  Smoking Status  . Never Smoker  Smokeless Tobacco  . Never Used     Counseling given: No   Past Medical History:  Diagnosis Date  . Abnormal mammogram    left medial breast - benign fat necrosis with residual calcification  . Atrial fibrillation ([place]) 2010   s/p DC cardioversion. on coumadin  . Benign head tremor   . Chronic bronchitis ([place])    "used to get it alot; not so much anymore" (08/27/2013)  . Dysrhythmia    a-fib, now NSR  . GERD (gastroesophageal reflux disease)   . H/O hiatal hernia    moderate sized/notes  . History of diverticulitis of colon 08/2013   sigmoid with possible microperf/abscess s/p hospitalization complicated by rectus sheath hematoma during lovenox bridge, leading to ARF from hematoma compression on kidney  . Hyperlipidemia   . Hypertension   . Hypothyroidism    borderline  . Insomnia   . Last menstrual period (LMP) > 10 days ago 1971  . Osteoarthritis of knee    s/p replacement  . Rectus sheath hematoma 08/27/2013   R after bridging with lovenox  . RSV bronchitis 09/13/2013  . Shortness of breath dyspnea   . Sleep apnea    states she wears her CPAP nightly   Past Surgical History:  Procedure Laterality Date  . ABDOMINAL EXPLORATION SURGERY  1971   "to see if I needed hysterectomy; dr thought qthing looked too good so he didn't do a hysterectomy at this time" (08/27/2013)  . APPENDECTOMY  1947  .  BREAST DUCTAL SYSTEM EXCISION Right 11/27/2015   Procedure: RIGHT  MAJOR DUCT  EXCISION;  Surgeon: Stark Klein, MD;  Location: [facility];  Service: General;  Laterality: Right;  . CARDIAC CATHETERIZATION     [facility];Dr. Clayborn Bigness  . CARDIOVERSION     Archie Endo 08/27/2013  . CHOLECYSTECTOMY  1980  . dexa  10/2012   WNL  . DILATION AND CURETTAGE OF UTERUS    . ELECTROPHYSIOLOGIC STUDY N/A 05/25/2015   Procedure: Cardioversion;  Surgeon: Wellington Hampshire, MD;  Location: [facility];  Service: Cardiovascular;  Laterality: N/A;  . JOINT REPLACEMENT    . PERCUTANEOUS NEPHROSTOMY  08/2013   ARF from hematoma compression  . REPLACEMENT TOTAL KNEE Bilateral 2005  . TONSILLECTOMY    . VAGINAL HYSTERECTOMY  1971   "2 wks after 1st dr said it was ok" (08/27/2013)   Family History  Problem Relation Age of Onset  . CAD Mother     angina  . Diabetes Mother   . Stroke Mother 21  . Alzheimer's disease Father     died from PNA  . Cancer Paternal Grandfather     ?   History  Sexual Activity  . Sexual activity: No    Outpatient Encounter Prescriptions as of 03/26/2016  Medication Sig  . acetaminophen (TYLENOL) 500 MG  tablet Take 1,000 mg by mouth every 6 (six) hours as needed for moderate pain.  Marland Kitchen amLODipine (NORVASC) 10 MG tablet TAKE 1 TABLET BY MOUTH ONCE A DAY  . clonazePAM (KLONOPIN) 0.5 MG tablet TAKE 1 TABLET BY MOUTH DAILY AT BEDTIME (Patient taking differently: TAKE 0.5  TABLET BY MOUTH DAILY AT BEDTIME)  . flecainide (TAMBOCOR) 150 MG tablet Take 1 tablet (150 mg total) by mouth 2 (two) times daily.  . fluticasone (FLONASE) 50 MCG/ACT nasal spray USE 2 SPARYS IN EACH NOSTRIL DAILY  . furosemide (LASIX) 40 MG tablet Take 1 tablet (40 mg total) by mouth 2 (two) times daily.  Marland Kitchen HYDROcodone-acetaminophen (NORCO) 5-325 MG tablet Take 0.5-1 tablets by mouth every 4 (four) hours as needed for moderate pain or severe pain.  Marland Kitchen lansoprazole (PREVACID) 15 MG capsule Take 1 capsule (15 mg  total) by mouth daily.  Marland Kitchen letrozole (FEMARA) 2.5 MG tablet Take 1 tablet (2.5 mg total) by mouth daily.  Marland Kitchen levothyroxine (SYNTHROID, LEVOTHROID) 25 MCG tablet Take 1 tablet (25 mcg total) by mouth daily.  Marland Kitchen losartan (COZAAR) 50 MG tablet TAKE 1 TABLET BY MOUTH ONCE A DAY  . metoprolol tartrate (LOPRESSOR) 25 MG tablet Take 1 tablet (25 mg total) by mouth 2 (two) times daily.  . simvastatin (ZOCOR) 20 MG tablet Take 0.5 tablets (10 mg total) by mouth every evening.  . traZODone (DESYREL) 50 MG tablet Take 50 mg by mouth at bedtime.  Marland Kitchen VITAMIN D, CHOLECALCIFEROL, PO Take 500 mg by mouth daily.  Marland Kitchen warfarin (COUMADIN) 5 MG tablet Take 1 tablet (5 mg total) by mouth as directed. (Patient taking differently: Take 5 mg by mouth as directed. 5 mg daily and then one-half on Friday)  . [DISCONTINUED] amLODipine (NORVASC) 10 MG tablet Take 1 tablet (10 mg total) by mouth daily.  . [DISCONTINUED] primidone (MYSOLINE) 50 MG tablet Take 1 tablet (50 mg total) by mouth at bedtime. (Patient taking differently: Take 25 mg by mouth at bedtime. )   No facility-administered encounter medications on file as of 03/26/2016.     Activities of Daily Living In your present state of health, do you have any difficulty performing the following activities: 03/26/2016 11/27/2015  Hearing? N N  Vision? N N  Difficulty concentrating or making decisions? N N  Walking or climbing stairs? N N  Dressing or bathing? N N  Doing errands, shopping? N -  Preparing Food and eating ? N -  Using the Toilet? N -  In the past six months, have you accidently leaked urine? N -  Do you have problems with loss of bowel control? N -  Managing your Medications? N -  Managing your Finances? N -  Housekeeping or managing your Housekeeping? N -  Some recent data might be hidden    Patient Care Team: Ria Bush, MD as PCP - General (Family Medicine) Wellington Hampshire, MD as Consulting Physician (Cardiology)    Assessment:      Hearing Screening   125Hz  250Hz  500Hz  1000Hz  2000Hz  3000Hz  4000Hz  6000Hz  8000Hz   Right ear:   0 0 0  0    Left ear:   0 0 0  0      Visual Acuity Screening   Right eye Left eye Both eyes  Without correction: 20/20 20/20 20/30   With correction:       Exercise Activities and Dietary recommendations Current Exercise Habits: The patient does not participate in regular exercise at present, Exercise limited by: Other -  see comments (pt is presently in active treatment for cancer)  Goals    . maintain healthy state          Starting 03/26/2016, I will continue to follow doctor's medical instructions and take medications as prescribed and as tolerated.       Fall Risk Fall Risk  03/26/2016 01/11/2016 04/27/2015 10/29/2012  Falls in the past year? No No No No   Depression Screen PHQ 2/9 Scores 03/26/2016 01/11/2016 04/27/2015 10/29/2012  PHQ - 2 Score 0 0 0 0     Cognitive Testing MMSE - Mini Mental State Exam 03/26/2016  Orientation to time 5  Orientation to Place 5  Registration 3  Attention/ Calculation 0  Recall 3  Language- name 2 objects 0  Language- repeat 1  Language- follow 3 step command 3  Language- read & follow direction 0  Write a sentence 0  Copy design 0  Total score 20  PLEASE NOTE: A Mini-Cog screen was completed. Maximum score is 20. A value of 0 denotes this part of [facility] MMSE was not completed or the patient failed this part of the Mini-Cog screening.   Mini-Cog Screening Orientation to Time - Max 5 pts Orientation to Place - Max 5 pts Registration - Max 3 pts Recall - Max 3 pts Language Repeat - Max 1 pts Language Follow 3 Step Command - Max 3 pts   Immunization History  Administered Date(s) Administered  . Influenza Split 07/07/2012  . Influenza Whole 06/16/2013  . Influenza,inj,Quad PF,36+ Mos 04/27/2015  . Influenza-Unspecified 06/02/2014  . Pneumococcal Conjugate-13 04/27/2015  . Pneumococcal Polysaccharide-23 09/02/2010  . Tdap 07/11/2009  .  Zoster 10/15/2010   Screening Tests Health Maintenance  Topic Date Due  . INFLUENZA VACCINE  04/02/2016  . DTaP/Tdap/Td (2 - Td) 07/12/2019  . TETANUS/TDAP  07/12/2019  . DEXA SCAN  Completed  . ZOSTAVAX  Completed  . PNA vac Low Risk Adult  Completed      Plan:     I have personally reviewed and addressed the Medicare Annual Wellness questionnaire and have noted the following in the patient's chart:  A. Medical and social history B. Use of alcohol, tobacco or illicit drugs  C. Current medications and supplements D. Functional ability and status E.  Nutritional status F.  Physical activity G. Advance directives H. List of other physicians I.  Hospitalizations, surgeries, and ER visits in previous 12 months J.  [place] to include hearing, vision, cognitive, depression L. Referrals and appointments - none  In addition, I have reviewed and discussed with patient certain preventive protocols, quality metrics, and best practice recommendations. A written personalized care plan for preventive services as well as general preventive health recommendations were provided to patient.  See attached scanned questionnaire for additional information.   Signed,   Lindell Noe, MHA, BS, LPN Health Advisor

## 2016-03-26 NOTE — Patient Instructions (Signed)
Mckenzie Frank , Thank you for taking time to come for your Medicare Wellness Visit. I appreciate your ongoing commitment to your health goals. Please review the following plan we discussed and let me know if I can assist you in the future.   These are the goals we discussed: Goals    . maintain healthy state          Starting 03/26/2016, I will continue to follow doctor's medical instructions and take medications as prescribed and as tolerated.        This is a list of the screening recommended for you and due dates:  Health Maintenance  Topic Date Due  . Flu Shot  04/02/2016  . DTaP/Tdap/Td vaccine (2 - Td) 07/12/2019  . Tetanus Vaccine  07/12/2019  . DEXA scan (bone density measurement)  Completed  . Shingles Vaccine  Completed  . Pneumonia vaccines  Completed   Preventive Care for Adults  A healthy lifestyle and preventive care can promote health and wellness. Preventive health guidelines for adults include the following key practices.  . A routine yearly physical is a good way to check with your health care provider about your health and preventive screening. It is a chance to share any concerns and updates on your health and to receive a thorough exam.  . Visit your dentist for a routine exam and preventive care every 6 months. Brush your teeth twice a day and floss once a day. Good oral hygiene prevents tooth decay and gum disease.  . The frequency of eye exams is based on your age, health, family medical history, use  of contact lenses, and other factors. Follow your health care provider's ecommendations for frequency of eye exams.  . Eat a healthy diet. Foods like vegetables, fruits, whole grains, low-fat dairy products, and lean protein foods contain the nutrients you need without too many calories. Decrease your intake of foods high in solid fats, added sugars, and salt. Eat the right amount of calories for you. Get information about a proper diet from your health care  provider, if necessary.  . Regular physical exercise is one of the most important things you can do for your health. Most adults should get at least 150 minutes of moderate-intensity exercise (any activity that increases your heart rate and causes you to sweat) each week. In addition, most adults need muscle-strengthening exercises on 2 or more days a week.  Silver Sneakers may be a benefit available to you. To determine eligibility, you may visit the website: www.silversneakers.com or contact program at [phone]) [phone] Mon-Fri between 8AM-8PM.   . Maintain a healthy weight. The body mass index (BMI) is a screening tool to identify possible weight problems. It provides an estimate of body fat based on height and weight. Your health care provider can find your BMI and can help you achieve or maintain a healthy weight.   For adults 20 years and older: ? A BMI below 18.5 is considered underweight. ? A BMI of 18.5 to 24.9 is normal. ? A BMI of 25 to 29.9 is considered overweight. ? A BMI of 30 and above is considered obese.   . Maintain normal blood lipids and cholesterol levels by exercising and minimizing your intake of saturated fat. Eat a balanced diet with plenty of fruit and vegetables. Blood tests for lipids and cholesterol should begin at age 6 and be repeated every 5 years. If your lipid or cholesterol levels are high, you are over 50, or you are at high  risk for heart disease, you may need your cholesterol levels checked more frequently. Ongoing high lipid and cholesterol levels should be treated with medicines if diet and exercise are not working.  . If you smoke, find out from your health care provider how to quit. If you do not use tobacco, please do not start.  . If you choose to drink alcohol, please do not consume more than 2 drinks per day. One drink is considered to be 12 ounces (355 mL) of beer, 5 ounces (148 mL) of wine, or 1.5 ounces (44 mL) of liquor.  . If you are 36-79 years  old, ask your health care provider if you should take aspirin to prevent strokes.  . Use sunscreen. Apply sunscreen liberally and repeatedly throughout the day. You should seek shade when your shadow is shorter than you. Protect yourself by wearing long sleeves, pants, a wide-brimmed hat, and sunglasses year round, whenever you are outdoors.  . Once a month, do a whole body skin exam, using a mirror to look at the skin on your back. Tell your health care provider of new moles, moles that have irregular borders, moles that are larger than a pencil eraser, or moles that have changed in shape or color.

## 2016-03-26 NOTE — Progress Notes (Signed)
I reviewed health advisor's note, was available for consultation, and agree with documentation and plan.  

## 2016-03-26 NOTE — Telephone Encounter (Signed)
plz phone in. 

## 2016-03-26 NOTE — Progress Notes (Signed)
Offers no complaints. States is feeling well. 

## 2016-03-27 NOTE — Telephone Encounter (Signed)
Rx called in as directed.   

## 2016-03-28 NOTE — Progress Notes (Signed)
[facility]  [phone] [phone]  ID: Mckenzie Frank OB: 19-Jan-1941  MR#: [id number]  [id number]  Patient Care Team: Ria Bush, MD as PCP - General (Family Medicine) Wellington Hampshire, MD as Consulting Physician (Cardiology)  CHIEF COMPLAINT: Right breast DCIS.  INTERVAL HISTORY: Patient returns to clinic today for further evaluation and initiation of an aromatase inhibitor. She completed her XRT without significant side effects. She currently feels well and is asymptomatic. She has no neurologic complaints. She denies any recent fevers or illnesses. She has a good appetite and denies weight loss. She has no chest pain or shortness of breath. She denies any nausea, vomiting, constipation, or diarrhea. She has no urinary complaints. Patient feels at her baseline and offers no specific complaints today.  REVIEW OF SYSTEMS:   Review of Systems  Constitutional: Negative.  Negative for fever, malaise/fatigue and weight loss.  Respiratory: Negative.  Negative for cough and shortness of breath.   Cardiovascular: Negative.  Negative for chest pain.  Gastrointestinal: Negative.   Genitourinary: Negative.   Musculoskeletal: Negative.   Neurological: Negative.  Negative for weakness.  Psychiatric/Behavioral: Negative.     As per HPI. Otherwise, a complete review of systems is negatve.  PAST MEDICAL HISTORY: Past Medical History:  Diagnosis Date  . Abnormal mammogram    left medial breast - benign fat necrosis with residual calcification  . Atrial fibrillation ([facility]) 2010   s/p DC cardioversion. on coumadin  . Benign head tremor   . Chronic bronchitis ([facility])    "used to get it alot; not so much anymore" (08/27/2013)  . Dysrhythmia    a-fib, now NSR  . GERD (gastroesophageal reflux disease)   . H/O hiatal hernia    moderate sized/notes  . History of diverticulitis of colon 08/2013   sigmoid with possible microperf/abscess s/p  hospitalization complicated by rectus sheath hematoma during lovenox bridge, leading to ARF from hematoma compression on kidney  . Hyperlipidemia   . Hypertension   . Hypothyroidism    borderline  . Insomnia   . Last menstrual period (LMP) > 10 days ago 1971  . Osteoarthritis of knee    s/p replacement  . Rectus sheath hematoma 08/27/2013   R after bridging with lovenox  . RSV bronchitis 09/13/2013  . Shortness of breath dyspnea   . Sleep apnea    states she wears her CPAP nightly    PAST SURGICAL HISTORY: Past Surgical History:  Procedure Laterality Date  . ABDOMINAL EXPLORATION SURGERY  1971   "to see if I needed hysterectomy; dr thought qthing looked too good so he didn't do a hysterectomy at this time" (08/27/2013)  . APPENDECTOMY  1947  . BREAST DUCTAL SYSTEM EXCISION Right 11/27/2015   Procedure: RIGHT  MAJOR DUCT  EXCISION;  Surgeon: Stark Klein, MD;  Location: [facility];  Service: General;  Laterality: Right;  . CARDIAC CATHETERIZATION     [facility];Dr. Clayborn Bigness  . CARDIOVERSION     Archie Endo 08/27/2013  . CHOLECYSTECTOMY  1980  . dexa  10/2012   WNL  . DILATION AND CURETTAGE OF UTERUS    . ELECTROPHYSIOLOGIC STUDY N/A 05/25/2015   Procedure: Cardioversion;  Surgeon: Wellington Hampshire, MD;  Location: [facility];  Service: Cardiovascular;  Laterality: N/A;  . JOINT REPLACEMENT    . PERCUTANEOUS NEPHROSTOMY  08/2013   ARF from hematoma compression  . REPLACEMENT TOTAL KNEE Bilateral 2005  . TONSILLECTOMY    . VAGINAL HYSTERECTOMY  1971   "  2 wks after 1st dr said it was ok" (08/27/2013)    FAMILY HISTORY Family History  Problem Relation Age of Onset  . CAD Mother     angina  . Diabetes Mother   . Stroke Mother 30  . Alzheimer's disease Father     died from PNA  . Cancer Paternal Grandfather     ?       ADVANCED DIRECTIVES:    HEALTH MAINTENANCE: Social History  Substance Use Topics  . Smoking status: Never Smoker  . Smokeless tobacco: Never Used   . Alcohol use No     Colonoscopy:  PAP:  Bone density:  Lipid panel:  Allergies  Allergen Reactions  . Paxil [Paroxetine Hcl] Other (See Comments)    Shaking   . Primidone Shortness Of Breath  . Clindamycin/Lincomycin Other (See Comments)    unknown  . Codeine Other (See Comments)    unknown  . Doxycycline Other (See Comments)    unknown  . Promethazine Other (See Comments)    unknown  . Sulfa Antibiotics Hives  . Valium Other (See Comments)    unknown    Current Outpatient Prescriptions  Medication Sig Dispense Refill  . acetaminophen (TYLENOL) 500 MG tablet Take 1,000 mg by mouth every 6 (six) hours as needed for moderate pain.    . flecainide (TAMBOCOR) 150 MG tablet Take 1 tablet (150 mg total) by mouth 2 (two) times daily. 180 tablet 3  . fluticasone (FLONASE) 50 MCG/ACT nasal spray USE 2 SPARYS IN EACH NOSTRIL DAILY 16 g 6  . furosemide (LASIX) 40 MG tablet Take 1 tablet (40 mg total) by mouth 2 (two) times daily. 60 tablet 3  . HYDROcodone-acetaminophen (NORCO) 5-325 MG tablet Take 0.5-1 tablets by mouth every 4 (four) hours as needed for moderate pain or severe pain. 15 tablet 0  . lansoprazole (PREVACID) 15 MG capsule Take 1 capsule (15 mg total) by mouth daily. 90 capsule 1  . levothyroxine (SYNTHROID, LEVOTHROID) 25 MCG tablet Take 1 tablet (25 mcg total) by mouth daily. 90 tablet 1  . losartan (COZAAR) 50 MG tablet TAKE 1 TABLET BY MOUTH ONCE A DAY 30 tablet 3  . metoprolol tartrate (LOPRESSOR) 25 MG tablet Take 1 tablet (25 mg total) by mouth 2 (two) times daily. 180 tablet 3  . simvastatin (ZOCOR) 20 MG tablet Take 0.5 tablets (10 mg total) by mouth every evening. 45 tablet 1  . VITAMIN D, CHOLECALCIFEROL, PO Take 500 mg by mouth daily.    Marland Kitchen warfarin (COUMADIN) 5 MG tablet Take 1 tablet (5 mg total) by mouth as directed. (Patient taking differently: Take 5 mg by mouth as directed. 5 mg daily and then one-half on Friday) 90 tablet 1  . amLODipine (NORVASC) 10 MG  tablet TAKE 1 TABLET BY MOUTH ONCE A DAY 90 tablet 1  . clonazePAM (KLONOPIN) 0.5 MG tablet Take 1 tablet (0.5 mg total) by mouth at bedtime. 30 tablet 0  . letrozole (FEMARA) 2.5 MG tablet Take 1 tablet (2.5 mg total) by mouth daily. 30 tablet 6  . traZODone (DESYREL) 50 MG tablet Take 50 mg by mouth at bedtime.     No current facility-administered medications for this visit.     OBJECTIVE: Vitals:   03/26/16 0932  BP: (!) 149/73  Pulse: 62  Resp: 18  Temp: (!) 96 F (35.6 C)     Body mass index is 44.52 kg/m.    ECOG FS:0 - Asymptomatic  General: Well-developed, well-nourished, no  acute distress. Eyes: Pink conjunctiva, anicteric sclera. Breasts: Patient requested exam be deferred today. Lungs: Clear to auscultation bilaterally. Heart: Regular rate and rhythm. No rubs, murmurs, or gallops. Abdomen: Soft, nontender, nondistended. No organomegaly noted, normoactive bowel sounds. Musculoskeletal: No edema, cyanosis, or clubbing. Neuro: Alert, answering all questions appropriately. Cranial nerves grossly intact. Skin: No rashes or petechiae noted. Psych: Normal affect.   LAB RESULTS:  Lab Results  Component Value Date   NA 140 03/26/2016   K 4.5 03/26/2016   CL 102 03/26/2016   CO2 32 03/26/2016   GLUCOSE 92 03/26/2016   BUN 18 03/26/2016   CREATININE 1.05 03/26/2016   CALCIUM 9.5 03/26/2016   PROT 7.2 04/17/2015   ALBUMIN 4.3 04/17/2015   AST 18 04/17/2015   ALT 21 04/17/2015   ALKPHOS 112 04/17/2015   BILITOT 1.0 04/17/2015   GFRNONAA >60 11/24/2015   GFRAA >60 11/24/2015    Lab Results  Component Value Date   WBC 6.0 03/07/2016   NEUTROABS 4.4 11/24/2015   HGB 14.1 03/07/2016   HCT 41.7 03/07/2016   MCV 83.8 03/07/2016   PLT 216 03/07/2016     STUDIES: No results found.  ASSESSMENT: DCIS right breast.  PLAN:    1. DCIS right breast: Patient has now completed her lumpectomy and adjuvant XRT. Because patient is currently on Coumadin, have  initiated letrozole for total 5 years completing treatment in July 2022. Return to clinic in 3 months for further evaluation. 2. Post menopausal: Because patient is initiating aromatase inhibitor, we will get a baseline bone mineral density in the next 1-2 weeks.  Approximately 30 minutes was spent in discussion of which greater than 50% was consultation.  Patient expressed understanding and was in agreement with this plan. She also understands that She can call clinic at any time with any questions, concerns, or complaints.   Lloyd Huger, MD   03/28/2016 12:17 PM

## 2016-04-04 ENCOUNTER — Other Ambulatory Visit: Payer: Self-pay

## 2016-04-05 ENCOUNTER — Encounter: Payer: Self-pay | Admitting: Family Medicine

## 2016-04-05 ENCOUNTER — Ambulatory Visit (INDEPENDENT_AMBULATORY_CARE_PROVIDER_SITE_OTHER): Payer: PPO | Admitting: Family Medicine

## 2016-04-05 ENCOUNTER — Other Ambulatory Visit (INDEPENDENT_AMBULATORY_CARE_PROVIDER_SITE_OTHER): Payer: PPO

## 2016-04-05 VITALS — BP 154/82 | HR 55 | Temp 97.4°F | Ht 65.0 in | Wt 288.5 lb

## 2016-04-05 DIAGNOSIS — D0511 Intraductal carcinoma in situ of right breast: Secondary | ICD-10-CM

## 2016-04-05 DIAGNOSIS — Z0000 Encounter for general adult medical examination without abnormal findings: Secondary | ICD-10-CM

## 2016-04-05 DIAGNOSIS — I481 Persistent atrial fibrillation: Secondary | ICD-10-CM

## 2016-04-05 DIAGNOSIS — E785 Hyperlipidemia, unspecified: Secondary | ICD-10-CM | POA: Diagnosis not present

## 2016-04-05 DIAGNOSIS — Z5181 Encounter for therapeutic drug level monitoring: Secondary | ICD-10-CM | POA: Diagnosis not present

## 2016-04-05 DIAGNOSIS — R0989 Other specified symptoms and signs involving the circulatory and respiratory systems: Secondary | ICD-10-CM

## 2016-04-05 DIAGNOSIS — I10 Essential (primary) hypertension: Secondary | ICD-10-CM | POA: Diagnosis not present

## 2016-04-05 DIAGNOSIS — Z7189 Other specified counseling: Secondary | ICD-10-CM | POA: Diagnosis not present

## 2016-04-05 DIAGNOSIS — E039 Hypothyroidism, unspecified: Secondary | ICD-10-CM

## 2016-04-05 DIAGNOSIS — I4819 Other persistent atrial fibrillation: Secondary | ICD-10-CM

## 2016-04-05 LAB — POCT INR: INR: 2.5

## 2016-04-05 MED ORDER — FUROSEMIDE 40 MG PO TABS: 40.0000 mg | ORAL_TABLET | Freq: Every day | ORAL | 3 refills | Status: AC

## 2016-04-05 MED ORDER — LOSARTAN POTASSIUM 50 MG PO TABS: 75.0000 mg | ORAL_TABLET | Freq: Every day | ORAL | 3 refills | Status: AC

## 2016-04-05 NOTE — Assessment & Plan Note (Signed)
Continue coumadin. Check INR today.

## 2016-04-05 NOTE — Progress Notes (Signed)
BP (!) 154/82   Pulse (!) 55   Temp 97.4 F (36.3 C) (Oral)   Ht 5\' 5"  (1.651 m)   Wt 288 lb 8 oz (130.9 kg)   SpO2 97%   BMI 48.01 kg/m    CC: CPE Subjective:    Patient ID: Mckenzie Frank, female    DOB: 12-20-1940, 75 y.o.   MRN: [id number]  HPI: ILISE ALSTROM is a 75 y.o. female presenting on 04/05/2016 for Annual Exam   Saw Katha Cabal last week for medicare wellness visit, note reviewed. Failed hearing screen. Pt notices trouble at restaurant around a lot of people. Declines audiology referral at this time.   Recent R breast DCIS s/p lumpectomy with adjuvant XRT. Taking letrozole for 5 yrs. Pending rpt DEXA by onc ([facility]).   Primidone caused dyspnea. Now off this medication. Continues klonopin for tremor.   Preventative: Colonoscopy - 2005 normal per pt. Cologuard sign up today Well woman with prior PCP, h/o partial hysterectomy (1971) due to heavy bleeding. Unsure if ovaries remain. Denies pelvic pain. No further paps. Declines pelvic exam. Mammogram - dx bilat 10/2015 - recent DCIS R breast s/p lumpectomy. Has upcoming appt for rpt mammogram DEXA Date: 10/2012 WNL. Pending rpt Flu shot - yearly Tdap - 2010 Pneumovax ~2012 , prevnar 2016 zostavax - 10/2010 Advanced directives - thinks would want daughter Vaughan Basta to be HCPOA. Doesn't have set up. Packet provided last week, will bring me copy. Seat belt use discussed  Sunscreen use discussed. No changing moles on skin   Caffeine: 1 cup coffee/day  Lives alone in senior living. Daughter in [place]  Occupation: retired, Scientist, research (medical)  Edu: HS  Activity: walks the track and mall daily Diet: good water, fruits/vegetables daily   Relevant past medical, surgical, family and social history reviewed and updated as indicated. Interim medical history since our last visit reviewed. Allergies and medications reviewed and updated. Current Outpatient Prescriptions on File Prior to Visit  Medication Sig  . acetaminophen (TYLENOL)  500 MG tablet Take 1,000 mg by mouth every 6 (six) hours as needed for moderate pain.  Marland Kitchen amLODipine (NORVASC) 10 MG tablet TAKE 1 TABLET BY MOUTH ONCE A DAY  . clonazePAM (KLONOPIN) 0.5 MG tablet Take 1 tablet (0.5 mg total) by mouth at bedtime.  . flecainide (TAMBOCOR) 150 MG tablet Take 1 tablet (150 mg total) by mouth 2 (two) times daily.  . fluticasone (FLONASE) 50 MCG/ACT nasal spray USE 2 SPARYS IN EACH NOSTRIL DAILY  . HYDROcodone-acetaminophen (NORCO) 5-325 MG tablet Take 0.5-1 tablets by mouth every 4 (four) hours as needed for moderate pain or severe pain.  Marland Kitchen lansoprazole (PREVACID) 15 MG capsule Take 1 capsule (15 mg total) by mouth daily.  Marland Kitchen letrozole (FEMARA) 2.5 MG tablet Take 1 tablet (2.5 mg total) by mouth daily.  Marland Kitchen levothyroxine (SYNTHROID, LEVOTHROID) 25 MCG tablet Take 1 tablet (25 mcg total) by mouth daily.  . metoprolol tartrate (LOPRESSOR) 25 MG tablet Take 1 tablet (25 mg total) by mouth 2 (two) times daily.  . simvastatin (ZOCOR) 20 MG tablet Take 0.5 tablets (10 mg total) by mouth every evening.  . traZODone (DESYREL) 50 MG tablet Take 50 mg by mouth at bedtime.  Marland Kitchen warfarin (COUMADIN) 5 MG tablet Take 1 tablet (5 mg total) by mouth as directed. (Patient taking differently: Take 5 mg by mouth as directed. 5 mg daily and then one-half on Friday)   No current facility-administered medications on file prior to visit.  Review of Systems  Constitutional: Negative for activity change, appetite change, chills, fatigue, fever and unexpected weight change.  HENT: Negative for hearing loss.   Eyes: Negative for visual disturbance.  Respiratory: Negative for cough, chest tightness, shortness of breath and wheezing.   Cardiovascular: Negative for chest pain, palpitations and leg swelling.  Gastrointestinal: Negative for abdominal distention, abdominal pain, blood in stool, constipation, diarrhea, nausea and vomiting.  Genitourinary: Negative for difficulty urinating and  hematuria.  Musculoskeletal: Negative for arthralgias, myalgias and neck pain.  Skin: Negative for rash.  Neurological: Negative for dizziness, seizures, syncope and headaches.  Hematological: Negative for adenopathy. Does not bruise/bleed easily.  Psychiatric/Behavioral: Negative for dysphoric mood. The patient is not nervous/anxious.    Per HPI unless specifically indicated in ROS section     Objective:    BP (!) 154/82   Pulse (!) 55   Temp 97.4 F (36.3 C) (Oral)   Ht 5\' 5"  (1.651 m)   Wt 288 lb 8 oz (130.9 kg)   SpO2 97%   BMI 48.01 kg/m   Wt Readings from Last 3 Encounters:  04/05/16 288 lb 8 oz (130.9 kg)  03/26/16 289 lb (131.1 kg)  03/26/16 288 lb 7.9 oz (130.9 kg)    Physical Exam  Constitutional: She is oriented to person, place, and time. She appears well-developed and well-nourished. No distress.  HENT:  Head: Normocephalic and atraumatic.  Right Ear: Hearing, tympanic membrane, external ear and ear canal normal.  Left Ear: Hearing, tympanic membrane, external ear and ear canal normal.  Nose: Nose normal.  Mouth/Throat: Uvula is midline, oropharynx is clear and moist and mucous membranes are normal. No oropharyngeal exudate, posterior oropharyngeal edema or posterior oropharyngeal erythema.  Eyes: Conjunctivae and EOM are normal. Pupils are equal, round, and reactive to light. No scleral icterus.  Neck: Normal range of motion. Neck supple. Carotid bruit is present (L>R). No thyromegaly present.  Cardiovascular: Normal rate, regular rhythm, normal heart sounds and intact distal pulses.   No murmur heard. Pulses:      Radial pulses are 2+ on the right side, and 2+ on the left side.  Pulmonary/Chest: Effort normal and breath sounds normal. No respiratory distress. She has no wheezes. She has no rales.  Abdominal: Soft. Bowel sounds are normal. She exhibits no distension and no mass. There is no tenderness. There is no rebound and no guarding.  Musculoskeletal:  Normal range of motion. She exhibits no edema.  Lymphadenopathy:    She has no cervical adenopathy.  Neurological: She is alert and oriented to person, place, and time.  CN grossly intact, station and gait intact  Skin: Skin is warm and dry. No rash noted.  Psychiatric: She has a normal mood and affect. Her behavior is normal. Judgment and thought content normal.  Nursing note and vitals reviewed.  Results for orders placed or performed in visit on [id number]  Basic Metabolic Panel  Result Value Ref Range   Sodium 140 135 - 145 mEq/L   Potassium 4.5 3.5 - 5.1 mEq/L   Chloride 102 96 - 112 mEq/L   CO2 32 19 - 32 mEq/L   Glucose, Bld 92 70 - 99 mg/dL   BUN 18 6 - 23 mg/dL   Creatinine, Ser 1.05 0.40 - 1.20 mg/dL   Calcium 9.5 8.4 - 10.5 mg/dL   GFR 54.02 (L) >60.00 mL/min  Lipid Panel  Result Value Ref Range   Cholesterol 198 0 - 200 mg/dL   Triglycerides 162.0 (H) 0.0 -  149.0 mg/dL   HDL 62.20 >39.00 mg/dL   VLDL 32.4 0.0 - 40.0 mg/dL   LDL Cholesterol 103 (H) 0 - 99 mg/dL   Total CHOL/HDL Ratio 3    NonHDL 135.52   TSH  Result Value Ref Range   TSH 2.00 0.35 - 4.50 uIU/mL  Vitamin D, 25-hydroxy  Result Value Ref Range   VITD 32.08 30.00 - 100.00 ng/mL      Assessment & Plan:   Problem List Items Addressed This Visit    Advanced care planning/counseling discussion    Advanced directives - thinks would want daughter Vaughan Basta to be HCPOA. Doesn't have set up. Packet provided last week, will bring me copy.      Ductal carcinoma in situ (DCIS) of right breast    Appreciate onc care of patient.      Health maintenance examination - Primary    Preventative protocols reviewed and updated unless pt declined. Discussed healthy diet and lifestyle.       Hyperlipidemia    FLP stable. Continue simvastatin.       Relevant Medications   losartan (COZAAR) 50 MG tablet   furosemide (LASIX) 40 MG tablet   Hypertension    Chronic, elevated. Continue current regimen, increase  losartan to 75mg  daily.       Relevant Medications   losartan (COZAAR) 50 MG tablet   furosemide (LASIX) 40 MG tablet   Hypothyroidism    Chronic, stable. Continue low dose levothyroxine.       Persistent atrial fibrillation (HCC)    Continue coumadin. Check INR today.       Relevant Medications   losartan (COZAAR) 50 MG tablet   furosemide (LASIX) 40 MG tablet    Other Visit Diagnoses    Bilateral carotid bruits       Relevant Orders   VAS US CAROTID       Follow up plan: Return in about 3 months (around 07/06/2016), or as needed, for follow up visit.  Ria Bush, MD

## 2016-04-05 NOTE — Assessment & Plan Note (Addendum)
FLP stable. Continue simvastatin.

## 2016-04-05 NOTE — Patient Instructions (Addendum)
Pass by lab for PT/INR.  We will sign you up for Cologuard. If you don't receive kit in the mail over next 2-3 weeks, let us know. Bring [place] copy of advanced directive. Increase losartan to 1.5 tablets daily ('75mg'$ ).  We will order carotid ultrasound Good to see you today, call us with questions.  Return in 3 months for blood pressure recheck.  Health Maintenance, Female Adopting a healthy lifestyle and getting preventive care can go a long way to promote health and wellness. Talk with your health care provider about what schedule of regular examinations is right for you. This is a good chance for you to check in with your provider about disease prevention and staying healthy. In between checkups, there are plenty of things you can do on your own. Experts have done a lot of research about which lifestyle changes and preventive measures are most likely to keep you healthy. Ask your health care provider for more information. WEIGHT AND DIET  Eat a healthy diet  Be sure to include plenty of vegetables, fruits, low-fat dairy products, and lean protein.  Do not eat a lot of foods high in solid fats, added sugars, or salt.  Get regular exercise. This is one of the most important things you can do for your health.  Most adults should exercise for at least 150 minutes each week. The exercise should increase your heart rate and make you sweat (moderate-intensity exercise).  Most adults should also do strengthening exercises at least twice a week. This is in addition to the moderate-intensity exercise.  Maintain a healthy weight  Body mass index (BMI) is a measurement that can be used to identify possible weight problems. It estimates body fat based on height and weight. Your health care provider can help determine your BMI and help you achieve or maintain a healthy weight.  For females 77 years of age and older:   A BMI below 18.5 is considered underweight.  A BMI of 18.5 to 24.9 is  normal.  A BMI of 25 to 29.9 is considered overweight.  A BMI of 30 and above is considered obese.  Watch levels of cholesterol and blood lipids  You should start having your blood tested for lipids and cholesterol at 77 years of age, then have this test every 5 years.  You may need to have your cholesterol levels checked more often if:  Your lipid or cholesterol levels are high.  You are older than 77 years of age.  You are at high risk for heart disease.  CANCER SCREENING   Lung Cancer  Lung cancer screening is recommended for adults 77-61 years old who are at high risk for lung cancer because of a history of smoking.  A yearly low-dose CT scan of the lungs is recommended for people who:  Currently smoke.  Have quit within the past 15 years.  Have at least a 30-pack-year history of smoking. A pack year is smoking an average of one pack of cigarettes a day for 1 year.  Yearly screening should continue until it has been 15 years since you quit.  Yearly screening should stop if you develop a health problem that would prevent you from having lung cancer treatment.  Breast Cancer  Practice breast self-awareness. This means understanding how your breasts normally appear and feel.  It also means doing regular breast self-exams. Let your health care provider know about any changes, no matter how small.  If you are in your 77 or 30s,  you should have a clinical breast exam (CBE) by a health care provider every 1-3 years as part of a regular health exam.  If you are 40 or older, have a CBE every 77 year. Also consider having a breast X-ray (mammogram) every year.  If you have a family history of breast cancer, talk to your health care provider about genetic screening.  If you are at high risk for breast cancer, talk to your health care provider about having an MRI and a mammogram every year.  Breast cancer gene (BRCA) assessment is recommended for women who have family members  with BRCA-related cancers. BRCA-related cancers include:  Breast.  Ovarian.  Tubal.  Peritoneal cancers.  Results of the assessment will determine the need for genetic counseling and BRCA1 and BRCA2 testing. Cervical Cancer Your health care provider may recommend that you be screened regularly for cancer of the pelvic organs (ovaries, uterus, and vagina). This screening involves a pelvic examination, including checking for microscopic changes to the surface of your cervix (Pap test). You may be encouraged to have this screening done every 3 years, beginning at age 38.  For women ages 77-65, health care providers may recommend pelvic exams and Pap testing every 3 years, or they may recommend the Pap and pelvic exam, combined with testing for human papilloma virus (HPV), every 5 years. Some types of HPV increase your risk of cervical cancer. Testing for HPV may also be done on women of any age with unclear Pap test results.  Other health care providers may not recommend any screening for nonpregnant women who are considered low risk for pelvic cancer and who do not have symptoms. Ask your health care provider if a screening pelvic exam is right for you.  If you have had past treatment for cervical cancer or a condition that could lead to cancer, you need Pap tests and screening for cancer for at least 20 years after your treatment. If Pap tests have been discontinued, your risk factors (such as having a new sexual partner) need to be reassessed to determine if screening should resume. Some women have medical problems that increase the chance of getting cervical cancer. In these cases, your health care provider may recommend more frequent screening and Pap tests. Colorectal Cancer  This type of cancer can be detected and often prevented.  Routine colorectal cancer screening usually begins at 77 years of age and continues through 77 years of age.  Your health care provider may recommend  screening at an earlier age if you have risk factors for colon cancer.  Your health care provider may also recommend using home test kits to check for hidden blood in the stool.  A small camera at the end of a tube can be used to examine your colon directly (sigmoidoscopy or colonoscopy). This is done to check for the earliest forms of colorectal cancer.  Routine screening usually begins at age 15.  Direct examination of the colon should be repeated every 5-10 years through 77 years of age. However, you may need to be screened more often if early forms of precancerous polyps or small growths are found. Skin Cancer  Check your skin from head to toe regularly.  Tell your health care provider about any new moles or changes in moles, especially if there is a change in a mole's shape or color.  Also tell your health care provider if you have a mole that is larger than the size of a pencil eraser.  Always use  sunscreen. Apply sunscreen liberally and repeatedly throughout the day.  Protect yourself by wearing long sleeves, pants, a wide-brimmed hat, and sunglasses whenever you are outside. HEART DISEASE, DIABETES, AND HIGH BLOOD PRESSURE   High blood pressure causes heart disease and increases the risk of stroke. High blood pressure is more likely to develop in:  People who have blood pressure in the high end of the normal range (130-139/85-89 mm Hg).  People who are overweight or obese.  People who are African American.  If you are 33-40 years of age, have your blood pressure checked every 3-5 years. If you are 99 years of age or older, have your blood pressure checked every year. You should have your blood pressure measured twice--once when you are at a hospital or clinic, and once when you are not at a hospital or clinic. Record the average of the two measurements. To check your blood pressure when you are not at a hospital or clinic, you can use:  An automated blood pressure machine at a  pharmacy.  A home blood pressure monitor.  If you are between 36 years and 40 years old, ask your health care provider if you should take aspirin to prevent strokes.  Have regular diabetes screenings. This involves taking a blood sample to check your fasting blood sugar level.  If you are at a normal weight and have a low risk for diabetes, have this test once every three years after 77 years of age.  If you are overweight and have a high risk for diabetes, consider being tested at a younger age or more often. PREVENTING INFECTION  Hepatitis B  If you have a higher risk for hepatitis B, you should be screened for this virus. You are considered at high risk for hepatitis B if:  You were born in a country where hepatitis B is common. Ask your health care provider which countries are considered high risk.  Your parents were born in a high-risk country, and you have not been immunized against hepatitis B (hepatitis B vaccine).  You have HIV or AIDS.  You use needles to inject street drugs.  You live with someone who has hepatitis B.  You have had sex with someone who has hepatitis B.  You get hemodialysis treatment.  You take certain medicines for conditions, including cancer, organ transplantation, and autoimmune conditions. Hepatitis C  Blood testing is recommended for:  Everyone born from [birth year] through 1967.  Anyone with known risk factors for hepatitis C. Sexually transmitted infections (STIs)  You should be screened for sexually transmitted infections (STIs) including gonorrhea and chlamydia if:  You are sexually active and are younger than 77 years of age.  You are older than 77 years of age and your health care provider tells you that you are at risk for this type of infection.  Your sexual activity has changed since you were last screened and you are at an increased risk for chlamydia or gonorrhea. Ask your health care provider if you are at risk.  If you do not  have HIV, but are at risk, it may be recommended that you take a prescription medicine daily to prevent HIV infection. This is called pre-exposure prophylaxis (PrEP). You are considered at risk if:  You are sexually active and do not regularly use condoms or know the HIV status of your partner(s).  You take drugs by injection.  You are sexually active with a partner who has HIV. Talk with your health care provider  about whether you are at high risk of being infected with HIV. If you choose to begin PrEP, you should first be tested for HIV. You should then be tested every 3 months for as long as you are taking PrEP.  PREGNANCY   If you are premenopausal and you may become pregnant, ask your health care provider about preconception counseling.  If you may become pregnant, take 400 to 800 micrograms (mcg) of folic acid every day.  If you want to prevent pregnancy, talk to your health care provider about birth control (contraception). OSTEOPOROSIS AND MENOPAUSE   Osteoporosis is a disease in which the bones lose minerals and strength with aging. This can result in serious bone fractures. Your risk for osteoporosis can be identified using a bone density scan.  If you are 59 years of age or older, or if you are at risk for osteoporosis and fractures, ask your health care provider if you should be screened.  Ask your health care provider whether you should take a calcium or vitamin D supplement to lower your risk for osteoporosis.  Menopause may have certain physical symptoms and risks.  Hormone replacement therapy may reduce some of these symptoms and risks. Talk to your health care provider about whether hormone replacement therapy is right for you.  HOME CARE INSTRUCTIONS   Schedule regular health, dental, and eye exams.  Stay current with your immunizations.   Do not use any tobacco products including cigarettes, chewing tobacco, or electronic cigarettes.  If you are pregnant, do not  drink alcohol.  If you are breastfeeding, limit how much and how often you drink alcohol.  Limit alcohol intake to no more than 1 drink per day for nonpregnant women. One drink equals 12 ounces of beer, 5 ounces of wine, or 1 ounces of hard liquor.  Do not use street drugs.  Do not share needles.  Ask your health care provider for help if you need support or information about quitting drugs.  Tell your health care provider if you often feel depressed.  Tell your health care provider if you have ever been abused or do not feel safe at home.   This information is not intended to replace advice given to you by your health care provider. Make sure you discuss any questions you have with your health care provider.   Document Released: 03/04/2011 Document Revised: 09/09/2014 Document Reviewed: 07/21/2013 Elsevier Interactive Patient Education [employer].

## 2016-04-05 NOTE — Assessment & Plan Note (Signed)
Preventative protocols reviewed and updated unless pt declined. Discussed healthy diet and lifestyle.  

## 2016-04-05 NOTE — Assessment & Plan Note (Signed)
Chronic, stable. Continue low dose levothyroxine.

## 2016-04-05 NOTE — Assessment & Plan Note (Signed)
Appreciate onc care of patient. 

## 2016-04-05 NOTE — Progress Notes (Signed)
Pre visit review using our clinic review tool, if applicable. No additional management support is needed unless otherwise documented below in the visit note. 

## 2016-04-05 NOTE — Assessment & Plan Note (Signed)
Chronic, elevated. Continue current regimen, increase losartan to 75mg  daily.

## 2016-04-05 NOTE — Assessment & Plan Note (Addendum)
Advanced directives - thinks would want daughter Vaughan Basta to be HCPOA. Doesn't have set up. Packet provided last week, will bring me copy.

## 2016-04-14 DIAGNOSIS — Z1211 Encounter for screening for malignant neoplasm of colon: Secondary | ICD-10-CM | POA: Diagnosis not present

## 2016-04-14 DIAGNOSIS — Z1212 Encounter for screening for malignant neoplasm of rectum: Secondary | ICD-10-CM | POA: Diagnosis not present

## 2016-04-14 LAB — COLOGUARD: Cologuard: NEGATIVE

## 2016-04-15 ENCOUNTER — Ambulatory Visit
Admission: RE | Admit: 2016-04-15 | Discharge: 2016-04-15 | Disposition: A | Discharge status: Home / Self Care | Payer: PPO | Source: Ambulatory Visit | Attending: Oncology | Admitting: Oncology

## 2016-04-15 DIAGNOSIS — Z780 Asymptomatic menopausal state: Secondary | ICD-10-CM | POA: Diagnosis not present

## 2016-04-15 DIAGNOSIS — M85852 Other specified disorders of bone density and structure, left thigh: Secondary | ICD-10-CM | POA: Diagnosis not present

## 2016-04-15 DIAGNOSIS — M8588 Other specified disorders of bone density and structure, other site: Secondary | ICD-10-CM | POA: Diagnosis not present

## 2016-04-15 DIAGNOSIS — D0511 Intraductal carcinoma in situ of right breast: Secondary | ICD-10-CM | POA: Insufficient documentation

## 2016-04-17 DIAGNOSIS — M25572 Pain in left ankle and joints of left foot: Secondary | ICD-10-CM | POA: Diagnosis not present

## 2016-04-17 DIAGNOSIS — I10 Essential (primary) hypertension: Secondary | ICD-10-CM | POA: Diagnosis not present

## 2016-04-17 DIAGNOSIS — S93402A Sprain of unspecified ligament of left ankle, initial encounter: Secondary | ICD-10-CM | POA: Diagnosis not present

## 2016-04-18 ENCOUNTER — Ambulatory Visit: Payer: PPO

## 2016-04-18 DIAGNOSIS — R0989 Other specified symptoms and signs involving the circulatory and respiratory systems: Secondary | ICD-10-CM | POA: Diagnosis not present

## 2016-04-19 DIAGNOSIS — G4733 Obstructive sleep apnea (adult) (pediatric): Secondary | ICD-10-CM | POA: Diagnosis not present

## 2016-04-19 DIAGNOSIS — S8390XA Sprain of unspecified site of unspecified knee, initial encounter: Secondary | ICD-10-CM | POA: Diagnosis not present

## 2016-04-20 ENCOUNTER — Encounter: Payer: Self-pay | Admitting: Family Medicine

## 2016-04-20 DIAGNOSIS — I6529 Occlusion and stenosis of unspecified carotid artery: Secondary | ICD-10-CM | POA: Insufficient documentation

## 2016-04-23 LAB — COLOGUARD: Cologuard: NEGATIVE

## 2016-04-26 ENCOUNTER — Encounter: Payer: Self-pay | Admitting: Family Medicine

## 2016-04-26 ENCOUNTER — Encounter: Payer: Self-pay | Admitting: *Deleted

## 2016-05-02 ENCOUNTER — Encounter: Payer: Self-pay | Admitting: Radiation Oncology

## 2016-05-02 ENCOUNTER — Ambulatory Visit
Admission: RE | Admit: 2016-05-02 | Discharge: 2016-05-02 | Disposition: A | Discharge status: Home / Self Care | Payer: PPO | Source: Ambulatory Visit | Attending: Radiation Oncology | Admitting: Radiation Oncology

## 2016-05-02 VITALS — BP 172/77 | HR 60 | Temp 98.0°F | Wt 287.8 lb

## 2016-05-02 DIAGNOSIS — Z170 Estrogen receptor positive status [ER+]: Secondary | ICD-10-CM | POA: Insufficient documentation

## 2016-05-02 DIAGNOSIS — Z923 Personal history of irradiation: Secondary | ICD-10-CM | POA: Insufficient documentation

## 2016-05-02 DIAGNOSIS — Z79810 Long term (current) use of selective estrogen receptor modulators (SERMs): Secondary | ICD-10-CM | POA: Insufficient documentation

## 2016-05-02 DIAGNOSIS — D0512 Intraductal carcinoma in situ of left breast: Secondary | ICD-10-CM | POA: Insufficient documentation

## 2016-05-02 DIAGNOSIS — D0511 Intraductal carcinoma in situ of right breast: Secondary | ICD-10-CM

## 2016-05-02 NOTE — Progress Notes (Signed)
Radiation Oncology Follow up Note  Name: Mckenzie Frank   Date:   05/02/2016 MRN:  [id number] DOB: Sep 07, 1940    This 75 y.o. 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 She's currently on tamoxifen tolerating that well. She's having some issues with swelling in her feet and needs to see a podiatrist I've made several recommendations. She specifically denies breast tenderness cough or bone pain..  COMPLICATIONS OF TREATMENT: none  FOLLOW UP COMPLIANCE: keeps appointments   PHYSICAL EXAM:  BP (!) 172/77   Pulse 60   Temp 98 F (36.7 C)   Wt 287 lb 13 oz (130.6 kg)   BMI 47.89 kg/m  Lungs are clear to A&P cardiac examination essentially unremarkable with regular rate and rhythm. No dominant mass or nodularity is noted in either breast in 2 positions examined. Incision is well-healed. No axillary or supraclavicular adenopathy is appreciated. Cosmetic result is excellent. Right breast is somewhat smaller than the left although the cosmetic result is still excellent. Well-developed well-nourished patient in NAD. HEENT reveals PERLA, EOMI, discs not visualized.  Oral cavity is clear. No oral mucosal lesions are identified. Neck is clear without evidence of cervical or supraclavicular adenopathy. Lungs are clear to A&P. Cardiac examination is essentially unremarkable with regular rate and rhythm without murmur rub or thrill. Abdomen is benign with no organomegaly or masses noted. Motor sensory and DTR levels are equal and symmetric in the upper and lower extremities. Cranial nerves II through XII are grossly intact. Proprioception is intact. No peripheral adenopathy or edema is identified. No motor or  sensory levels are noted. Crude visual fields are within normal range.  RADIOLOGY RESULTS: No current films for review  PLAN: At the present time she is doing extremely well recovering nicely from her radiation therapy treatments. She's currently on tamoxifen tolerating that well without side effect. I have asked to see her back in 4-5 months for follow-up. She knows to call sooner with any concerns.  I would like to take this opportunity to thank you for allowing me to participate in the care of your patient.Armstead Peaks., MD

## 2016-05-03 ENCOUNTER — Other Ambulatory Visit: Payer: Self-pay

## 2016-05-03 ENCOUNTER — Ambulatory Visit (INDEPENDENT_AMBULATORY_CARE_PROVIDER_SITE_OTHER)
Admission: RE | Admit: 2016-05-03 | Discharge: 2016-05-03 | Disposition: A | Discharge status: Home / Self Care | Payer: PPO | Source: Ambulatory Visit | Attending: Family Medicine | Admitting: Family Medicine

## 2016-05-03 ENCOUNTER — Encounter: Payer: Self-pay | Admitting: Family Medicine

## 2016-05-03 ENCOUNTER — Ambulatory Visit (INDEPENDENT_AMBULATORY_CARE_PROVIDER_SITE_OTHER): Payer: PPO | Admitting: Family Medicine

## 2016-05-03 VITALS — BP 122/82 | HR 57 | Temp 98.0°F | Wt 288.0 lb

## 2016-05-03 DIAGNOSIS — Z23 Encounter for immunization: Secondary | ICD-10-CM | POA: Diagnosis not present

## 2016-05-03 DIAGNOSIS — M79672 Pain in left foot: Secondary | ICD-10-CM | POA: Diagnosis not present

## 2016-05-03 DIAGNOSIS — Z5181 Encounter for therapeutic drug level monitoring: Secondary | ICD-10-CM | POA: Diagnosis not present

## 2016-05-03 DIAGNOSIS — M19072 Primary osteoarthritis, left ankle and foot: Secondary | ICD-10-CM | POA: Diagnosis not present

## 2016-05-03 LAB — POCT INR: INR: 2.9

## 2016-05-03 MED ORDER — DICLOFENAC SODIUM 1 % TD GEL: 1.0000 "application " | Freq: Three times a day (TID) | TRANSDERMAL | 1 refills | Status: AC

## 2016-05-03 NOTE — Assessment & Plan Note (Addendum)
Anticipate foot tendonitis ?tibialis anterior - treat with voltaren gel. xray today to r/o fx - clear on my read. If no better, update for podiatry referral. Encouraged regular use of supportive shoes.

## 2016-05-03 NOTE — Progress Notes (Signed)
BP 122/82 (BP Location: Left Arm, Patient Position: Sitting, Cuff Size: Large)   Pulse (!) 57   Temp 98 F (36.7 C) (Oral)   Wt 288 lb (130.6 kg)   SpO2 93%   BMI 47.93 kg/m    CC: "something's going on with left foot" Subjective:    Patient ID: Linna Hoff, female    DOB: [birth year], 75 y.o.   MRN: [id number]  HPI: ZAKAYA CUBILLAS is a 75 y.o. female presenting on 05/03/2016 for Foot Pain (Left foot pain started 2 weeks ago)   1+wk h/o L foot pain described as throbbing pain medial ankle and swelling lateral dorsal foot. Has been taking 1000mg  tylenol Q6 hours. No improvement noted. Denies inciting trauma/injury or fall. Ice packs also helped.   No fevers/chills, other joint or bone pain, no warmth or redness of foot.  Known knee OA s/p bilateral knee replacement.   Relevant past medical, surgical, family and social history reviewed and updated as indicated. Interim medical history since our last visit reviewed. Allergies and medications reviewed and updated. Current Outpatient Prescriptions on File Prior to Visit  Medication Sig  . acetaminophen (TYLENOL) 500 MG tablet Take 1,000 mg by mouth every 6 (six) hours as needed for moderate pain.  Marland Kitchen amLODipine (NORVASC) 10 MG tablet TAKE 1 TABLET BY MOUTH ONCE A DAY  . cholecalciferol (VITAMIN D) 1000 units tablet Take 1,000 Units by mouth daily.  . clonazePAM (KLONOPIN) 0.5 MG tablet Take 1 tablet (0.5 mg total) by mouth at bedtime.  . flecainide (TAMBOCOR) 150 MG tablet Take 1 tablet (150 mg total) by mouth 2 (two) times daily.  . fluticasone (FLONASE) 50 MCG/ACT nasal spray USE 2 SPARYS IN EACH NOSTRIL DAILY  . furosemide (LASIX) 40 MG tablet Take 1 tablet (40 mg total) by mouth daily.  Marland Kitchen HYDROcodone-acetaminophen (NORCO) 5-325 MG tablet Take 0.5-1 tablets by mouth every 4 (four) hours as needed for moderate pain or severe pain.  Marland Kitchen lansoprazole (PREVACID) 15 MG capsule Take 1 capsule (15 mg total) by mouth daily.  Marland Kitchen letrozole  (FEMARA) 2.5 MG tablet Take 1 tablet (2.5 mg total) by mouth daily.  Marland Kitchen levothyroxine (SYNTHROID, LEVOTHROID) 25 MCG tablet Take 1 tablet (25 mcg total) by mouth daily.  Marland Kitchen losartan (COZAAR) 50 MG tablet Take 1.5 tablets (75 mg total) by mouth daily.  . metoprolol tartrate (LOPRESSOR) 25 MG tablet Take 1 tablet (25 mg total) by mouth 2 (two) times daily.  . simvastatin (ZOCOR) 20 MG tablet Take 0.5 tablets (10 mg total) by mouth every evening.  . traZODone (DESYREL) 50 MG tablet Take 50 mg by mouth at bedtime.  Marland Kitchen warfarin (COUMADIN) 5 MG tablet Take 1 tablet (5 mg total) by mouth as directed. (Patient taking differently: Take 5 mg by mouth as directed. 5 mg daily and then one-half on Friday)   No current facility-administered medications on file prior to visit.     Review of Systems Per HPI unless specifically indicated in ROS section     Objective:    BP 122/82 (BP Location: Left Arm, Patient Position: Sitting, Cuff Size: Large)   Pulse (!) 57   Temp 98 F (36.7 C) (Oral)   Wt 288 lb (130.6 kg)   SpO2 93%   BMI 47.93 kg/m   Wt Readings from Last 3 Encounters:  05/03/16 288 lb (130.6 kg)  05/02/16 287 lb 13 oz (130.6 kg)  04/05/16 288 lb 8 oz (130.9 kg)    Physical Exam  Constitutional: She is oriented to person, place, and time. She appears well-developed and well-nourished. No distress.  Musculoskeletal: She exhibits no edema.  Nonpitting edema bilaterally 2+ DP bilaterally R foot WNL L foot - Tender to palpation medial ankle and anterior ankle No pain at navicular, at base of 5th MT, or at MT shafts. 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  Sensation intact  Skin: Skin is warm and dry. No rash noted.  Nursing note and vitals reviewed.  Results for orders placed or performed in visit on 04/26/16    Cologuard  Result Value Ref Range   Cologuard Negative       Assessment & Plan:   Problem List Items Addressed This Visit    Encounter for therapeutic drug monitoring    INR 2.9 today. Continue current dose, recheck in 4 wks.       Left foot pain - Primary    Anticipate foot tendonitis ?tibialis anterior - treat with voltaren gel. xray today to r/o fx - clear on my read. If no better, update for podiatry referral. Encouraged regular use of supportive shoes.       Relevant Orders   DG Foot 2 Views Left   DG Ankle Complete Left    Other Visit Diagnoses    Need for influenza vaccination       Relevant Orders   Flu Vaccine QUAD 36+ mos PF IM (Fluarix & Fluzone Quad PF) (Completed)       Follow up plan: Return if symptoms worsen or fail to improve.  Ria Bush, MD

## 2016-05-03 NOTE — Addendum Note (Signed)
Addended by: Ellamae Sia on: 05/03/2016 10:19 AM   Modules accepted: Orders

## 2016-05-03 NOTE — Patient Instructions (Signed)
Xray today. I think you may have foot tendonitis and loss of longitudinal arch - treat with voltaren gel  Use more supportive shoes. Let us know if no better for podiatry referral.

## 2016-05-03 NOTE — Assessment & Plan Note (Signed)
INR 2.9 today. Continue current dose, recheck in 4 wks.

## 2016-05-07 ENCOUNTER — Other Ambulatory Visit: Payer: Self-pay | Admitting: *Deleted

## 2016-05-07 MED ORDER — FLECAINIDE ACETATE 150 MG PO TABS: 150.0000 mg | ORAL_TABLET | Freq: Two times a day (BID) | ORAL | 3 refills | Status: AC

## 2016-05-07 MED ORDER — METOPROLOL TARTRATE 25 MG PO TABS: 25.0000 mg | ORAL_TABLET | Freq: Two times a day (BID) | ORAL | 3 refills | Status: AC

## 2016-05-10 ENCOUNTER — Telehealth: Payer: Self-pay

## 2016-05-10 DIAGNOSIS — M79672 Pain in left foot: Secondary | ICD-10-CM

## 2016-05-10 NOTE — Telephone Encounter (Signed)
Pt left v/m requesting referral to podiatrist; pt seen 05/03/16 and foot is no better; pt wants to see podiatrist at [facility].

## 2016-05-11 NOTE — Telephone Encounter (Signed)
referral placed

## 2016-05-20 DIAGNOSIS — G4733 Obstructive sleep apnea (adult) (pediatric): Secondary | ICD-10-CM | POA: Diagnosis not present

## 2016-05-20 DIAGNOSIS — S8390XA Sprain of unspecified site of unspecified knee, initial encounter: Secondary | ICD-10-CM | POA: Diagnosis not present

## 2016-05-21 ENCOUNTER — Encounter: Payer: Self-pay | Admitting: Podiatry

## 2016-05-21 ENCOUNTER — Ambulatory Visit (INDEPENDENT_AMBULATORY_CARE_PROVIDER_SITE_OTHER): Payer: PPO | Admitting: Podiatry

## 2016-05-21 ENCOUNTER — Ambulatory Visit (INDEPENDENT_AMBULATORY_CARE_PROVIDER_SITE_OTHER): Payer: PPO

## 2016-05-21 VITALS — Resp 16 | Ht 65.0 in | Wt 270.0 lb

## 2016-05-21 DIAGNOSIS — M12572 Traumatic arthropathy, left ankle and foot: Secondary | ICD-10-CM

## 2016-05-21 DIAGNOSIS — M65872 Other synovitis and tenosynovitis, left ankle and foot: Secondary | ICD-10-CM | POA: Diagnosis not present

## 2016-05-21 DIAGNOSIS — M6789 Other specified disorders of synovium and tendon, multiple sites: Secondary | ICD-10-CM

## 2016-05-21 DIAGNOSIS — M79672 Pain in left foot: Secondary | ICD-10-CM

## 2016-05-21 DIAGNOSIS — M25572 Pain in left ankle and joints of left foot: Secondary | ICD-10-CM | POA: Diagnosis not present

## 2016-05-21 DIAGNOSIS — M659 Synovitis and tenosynovitis, unspecified: Secondary | ICD-10-CM

## 2016-05-21 DIAGNOSIS — M19172 Post-traumatic osteoarthritis, left ankle and foot: Secondary | ICD-10-CM

## 2016-05-21 DIAGNOSIS — M76829 Posterior tibial tendinitis, unspecified leg: Secondary | ICD-10-CM

## 2016-05-21 MED ORDER — BETAMETHASONE SOD PHOS & ACET 6 (3-3) MG/ML IJ SUSP: 12.0000 mg | Freq: Once | INTRAMUSCULAR | Status: AC

## 2016-05-21 NOTE — Progress Notes (Signed)
   Subjective:    Patient ID: Mckenzie Frank, female    DOB: 1941/01/13, 75 y.o.   MRN: [id number]  HPI    Review of Systems  All other systems reviewed and are negative.      Objective:   Physical Exam        Assessment & Plan:

## 2016-05-21 NOTE — Patient Instructions (Signed)
Ankle Sprain  An ankle sprain is an injury to the strong, fibrous tissues (ligaments) that hold the bones of your ankle joint together.   CAUSES  An ankle sprain is usually caused by a fall or by twisting your ankle. Ankle sprains most commonly occur when you step on the outer edge of your foot, and your ankle turns inward. People who participate in sports are more prone to these types of injuries.   SYMPTOMS    Pain in your ankle. The pain may be present at rest or only when you are trying to stand or walk.   Swelling.   Bruising. Bruising may develop immediately or within 1 to 2 days after your injury.   Difficulty standing or walking, particularly when turning corners or changing directions.  DIAGNOSIS   Your caregiver will ask you details about your injury and perform a physical exam of your ankle to determine if you have an ankle sprain. During the physical exam, your caregiver will press on and apply pressure to specific areas of your foot and ankle. Your caregiver will try to move your ankle in certain ways. An X-ray exam may be done to be sure a bone was not broken or a ligament did not separate from one of the bones in your ankle (avulsion fracture).   TREATMENT   Certain types of braces can help stabilize your ankle. Your caregiver can make a recommendation for this. Your caregiver may recommend the use of medicine for pain. If your sprain is severe, your caregiver may refer you to a surgeon who helps to restore function to parts of your skeletal system (orthopedist) or a physical therapist.  HOME CARE INSTRUCTIONS    Apply ice to your injury for 1-2 days or as directed by your caregiver. Applying ice helps to reduce inflammation and pain.    Put ice in a plastic bag.    Place a towel between your skin and the bag.    Leave the ice on for 15-20 minutes at a time, every 2 hours while you are awake.   Only take over-the-counter or prescription medicines for pain, discomfort, or fever as directed by  your caregiver.   Elevate your injured ankle above the level of your heart as much as possible for 2-3 days.   If your caregiver recommends crutches, use them as instructed. Gradually put weight on the affected ankle. Continue to use crutches or a cane until you can walk without feeling pain in your ankle.   If you have a plaster splint, wear the splint as directed by your caregiver. Do not rest it on anything harder than a pillow for the first 24 hours. Do not put weight on it. Do not get it wet. You may take it off to take a shower or bath.   You may have been given an elastic bandage to wear around your ankle to provide support. If the elastic bandage is too tight (you have numbness or tingling in your foot or your foot becomes cold and blue), adjust the bandage to make it comfortable.   If you have an air splint, you may blow more air into it or let air out to make it more comfortable. You may take your splint off at night and before taking a shower or bath. Wiggle your toes in the splint several times per day to decrease swelling.  SEEK MEDICAL CARE IF:    You have rapidly increasing bruising or swelling.   Your toes feel   extremely cold or you lose feeling in your foot.   Your pain is not relieved with medicine.  SEEK IMMEDIATE MEDICAL CARE IF:   Your toes are numb or blue.   You have severe pain that is increasing.  MAKE SURE YOU:    Understand these instructions.   Will watch your condition.   Will get help right away if you are not doing well or get worse.     This information is not intended to replace advice given to you by your health care provider. Make sure you discuss any questions you have with your health care provider.     Document Released: 08/19/2005 Document Revised: 09/09/2014 Document Reviewed: 08/31/2011  Elsevier Interactive Patient Education 2016 [employer]
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
## 2016-05-22 ENCOUNTER — Telehealth: Payer: Self-pay | Admitting: *Deleted

## 2016-05-22 MED ORDER — NONFORMULARY OR COMPOUNDED ITEM: 2 refills | Status: AC

## 2016-05-22 NOTE — Telephone Encounter (Signed)
Dr. Amalia Hailey ordered [place] Combination Pain Cream.  Faxed.

## 2016-05-23 ENCOUNTER — Other Ambulatory Visit: Payer: Self-pay | Admitting: Family Medicine

## 2016-05-23 NOTE — Telephone Encounter (Signed)
Last filled 03-26-16 #30 Last OV 05-03-16

## 2016-05-24 ENCOUNTER — Ambulatory Visit: Payer: PPO | Admitting: Podiatry

## 2016-05-24 ENCOUNTER — Other Ambulatory Visit: Payer: Self-pay | Admitting: *Deleted

## 2016-05-24 MED ORDER — CLONAZEPAM 0.5 MG PO TABS: 0.5000 mg | ORAL_TABLET | Freq: Every day | ORAL | 0 refills | Status: DC

## 2016-05-24 NOTE — Telephone Encounter (Signed)
Ok to refill? Last filled 03/26/16 #30 0RF

## 2016-05-24 NOTE — Telephone Encounter (Signed)
plz phone in. 

## 2016-05-27 NOTE — Telephone Encounter (Signed)
Rx called in as directed.   

## 2016-05-30 DIAGNOSIS — G4733 Obstructive sleep apnea (adult) (pediatric): Secondary | ICD-10-CM | POA: Diagnosis not present

## 2016-05-30 DIAGNOSIS — S8390XA Sprain of unspecified site of unspecified knee, initial encounter: Secondary | ICD-10-CM | POA: Diagnosis not present

## 2016-06-03 ENCOUNTER — Other Ambulatory Visit (INDEPENDENT_AMBULATORY_CARE_PROVIDER_SITE_OTHER): Payer: PPO

## 2016-06-03 DIAGNOSIS — Z5181 Encounter for therapeutic drug level monitoring: Secondary | ICD-10-CM

## 2016-06-03 DIAGNOSIS — G4733 Obstructive sleep apnea (adult) (pediatric): Secondary | ICD-10-CM | POA: Diagnosis not present

## 2016-06-03 DIAGNOSIS — S8390XA Sprain of unspecified site of unspecified knee, initial encounter: Secondary | ICD-10-CM | POA: Diagnosis not present

## 2016-06-03 LAB — POCT INR: INR: 4

## 2016-06-07 DIAGNOSIS — G4733 Obstructive sleep apnea (adult) (pediatric): Secondary | ICD-10-CM | POA: Diagnosis not present

## 2016-06-07 DIAGNOSIS — S8390XA Sprain of unspecified site of unspecified knee, initial encounter: Secondary | ICD-10-CM | POA: Diagnosis not present

## 2016-06-10 DIAGNOSIS — G4733 Obstructive sleep apnea (adult) (pediatric): Secondary | ICD-10-CM | POA: Diagnosis not present

## 2016-06-10 DIAGNOSIS — S8390XA Sprain of unspecified site of unspecified knee, initial encounter: Secondary | ICD-10-CM | POA: Diagnosis not present

## 2016-06-17 ENCOUNTER — Other Ambulatory Visit (INDEPENDENT_AMBULATORY_CARE_PROVIDER_SITE_OTHER): Payer: PPO

## 2016-06-17 DIAGNOSIS — Z5181 Encounter for therapeutic drug level monitoring: Secondary | ICD-10-CM

## 2016-06-17 LAB — POCT INR: INR: 3.1

## 2016-06-19 DIAGNOSIS — G4733 Obstructive sleep apnea (adult) (pediatric): Secondary | ICD-10-CM | POA: Diagnosis not present

## 2016-06-19 DIAGNOSIS — S8390XA Sprain of unspecified site of unspecified knee, initial encounter: Secondary | ICD-10-CM | POA: Diagnosis not present

## 2016-06-21 ENCOUNTER — Encounter: Payer: Self-pay | Admitting: *Deleted

## 2016-06-25 NOTE — Progress Notes (Deleted)
Mckenzie Frank  [phone]) [phone] [phone]) [phone]  ID: Mckenzie Frank OB: 09/24/1940  MR#: [id number]  [id number]  Patient Care Team: Ria Bush, MD as PCP - General (Family Medicine) Wellington Hampshire, MD as Consulting Physician (Cardiology)  CHIEF COMPLAINT: Right breast DCIS.  INTERVAL HISTORY: Patient returns to clinic today for further evaluation and initiation of an aromatase inhibitor. She completed her XRT without significant side effects. She currently feels well and is asymptomatic. She has no neurologic complaints. She denies any recent fevers or illnesses. She has a good appetite and denies weight loss. She has no chest pain or shortness of breath. She denies any nausea, vomiting, constipation, or diarrhea. She has no urinary complaints. Patient feels at her baseline and offers no specific complaints today.  REVIEW OF SYSTEMS:   Review of Systems  Constitutional: Negative.  Negative for fever, malaise/fatigue and weight loss.  Respiratory: Negative.  Negative for cough and shortness of breath.   Cardiovascular: Negative.  Negative for chest pain.  Gastrointestinal: Negative.   Genitourinary: Negative.   Musculoskeletal: Negative.   Neurological: Negative.  Negative for weakness.  Psychiatric/Behavioral: Negative.     As per HPI. Otherwise, a complete review of systems is negatve.  PAST MEDICAL HISTORY: Past Medical History:  Diagnosis Date  . Abnormal mammogram    left medial breast - benign fat necrosis with residual calcification  . Atrial fibrillation ([place]) 2010   s/p DC cardioversion. on coumadin  . Benign head tremor   . Chronic bronchitis ([place])    "used to get it alot; not so much anymore" (08/27/2013)  . Dysrhythmia    a-fib, now NSR  . GERD (gastroesophageal reflux disease)   . H/O hiatal hernia    moderate sized/notes  . History of diverticulitis of colon 08/2013   sigmoid with possible microperf/abscess s/p  hospitalization complicated by rectus sheath hematoma during lovenox bridge, leading to ARF from hematoma compression on kidney  . Hyperlipidemia   . Hypertension   . Hypothyroidism    borderline  . Insomnia   . Last menstrual period (LMP) > 10 days ago 1971  . Osteoarthritis of knee    s/p replacement  . Rectus sheath hematoma 08/27/2013   R after bridging with lovenox  . RSV bronchitis 09/13/2013  . Shortness of breath dyspnea   . Sleep apnea    states she wears her CPAP nightly    PAST SURGICAL HISTORY: Past Surgical History:  Procedure Laterality Date  . ABDOMINAL EXPLORATION SURGERY  1971   "to see if I needed hysterectomy; dr thought qthing looked too good so he didn't do a hysterectomy at this time" (08/27/2013)  . APPENDECTOMY  1947  . BREAST DUCTAL SYSTEM EXCISION Right 11/27/2015   Procedure: RIGHT  MAJOR DUCT  EXCISION;  Surgeon: Stark Klein, MD;  Location: [facility];  Service: General;  Laterality: Right;  . CARDIAC CATHETERIZATION     [facility];Dr. Clayborn Bigness  . CARDIOVERSION     Archie Endo 08/27/2013  . CHOLECYSTECTOMY  1980  . dexa  10/2012   WNL  . DILATION AND CURETTAGE OF UTERUS    . ELECTROPHYSIOLOGIC STUDY N/A 05/25/2015   Procedure: Cardioversion;  Surgeon: Wellington Hampshire, MD;  Location: [facility];  Service: Cardiovascular;  Laterality: N/A;  . JOINT REPLACEMENT    . PERCUTANEOUS NEPHROSTOMY  08/2013   ARF from hematoma compression  . REPLACEMENT TOTAL KNEE Bilateral 2005  . TONSILLECTOMY    . VAGINAL HYSTERECTOMY  1971   "  2 wks after 1st dr said it was ok" (08/27/2013)    FAMILY HISTORY Family History  Problem Relation Age of Onset  . CAD Mother     angina  . Diabetes Mother   . Stroke Mother 49  . Alzheimer's disease Father     died from PNA  . Cancer Paternal Grandfather     ?       ADVANCED DIRECTIVES:    HEALTH MAINTENANCE: Social History  Substance Use Topics  . Smoking status: Never Smoker  . Smokeless tobacco: Never Used   . Alcohol use No     Colonoscopy:  PAP:  Bone density:  Lipid panel:  Allergies  Allergen Reactions  . Paxil [Paroxetine Hcl] Other (See Comments)    Shaking   . Primidone Shortness Of Breath  . Clindamycin/Lincomycin Other (See Comments)    unknown  . Codeine Other (See Comments)    unknown  . Doxycycline Other (See Comments)    unknown  . Promethazine Other (See Comments)    unknown  . Sulfa Antibiotics Hives  . Valium Other (See Comments)    unknown    Current Outpatient Prescriptions  Medication Sig Dispense Refill  . acetaminophen (TYLENOL) 500 MG tablet Take 1,000 mg by mouth every 6 (six) hours as needed for moderate pain.    Marland Kitchen amLODipine (NORVASC) 10 MG tablet TAKE 1 TABLET BY MOUTH ONCE A DAY 90 tablet 1  . cholecalciferol (VITAMIN D) 1000 units tablet Take 1,000 Units by mouth daily.    . clonazePAM (KLONOPIN) 0.5 MG tablet Take 1 tablet (0.5 mg total) by mouth at bedtime. 30 tablet 0  . diclofenac sodium (VOLTAREN) 1 % GEL Apply 1 application topically 3 (three) times daily. 1 Tube 1  . flecainide (TAMBOCOR) 150 MG tablet Take 1 tablet (150 mg total) by mouth 2 (two) times daily. 180 tablet 3  . fluticasone (FLONASE) 50 MCG/ACT nasal spray USE 2 SPARYS IN EACH NOSTRIL DAILY 16 g 6  . furosemide (LASIX) 40 MG tablet Take 1 tablet (40 mg total) by mouth daily. 90 tablet 3  . HYDROcodone-acetaminophen (NORCO) 5-325 MG tablet Take 0.5-1 tablets by mouth every 4 (four) hours as needed for moderate pain or severe pain. 15 tablet 0  . lansoprazole (PREVACID) 15 MG capsule Take 1 capsule (15 mg total) by mouth daily. 90 capsule 1  . letrozole (FEMARA) 2.5 MG tablet Take 1 tablet (2.5 mg total) by mouth daily. 30 tablet 6  . levothyroxine (SYNTHROID, LEVOTHROID) 25 MCG tablet Take 1 tablet (25 mcg total) by mouth daily. 90 tablet 1  . losartan (COZAAR) 50 MG tablet Take 1.5 tablets (75 mg total) by mouth daily. 135 tablet 3  . metoprolol tartrate (LOPRESSOR) 25 MG tablet  Take 1 tablet (25 mg total) by mouth 2 (two) times daily. 180 tablet 3  . NONFORMULARY OR COMPOUNDED ITEM [facility]: Combination Pain Cream - Baclofen 2%, Doxepin 5%, Gabapentin 6%, Topiramate 2%, Pentoxifylline 3%, apply 1-2 grams to affected area 3-4 times daily. 120 each 2  . simvastatin (ZOCOR) 20 MG tablet Take 0.5 tablets (10 mg total) by mouth every evening. 45 tablet 1  . traZODone (DESYREL) 50 MG tablet TAKE 1/2 TO 1 TABLET BY MOUTH EVERY NIGHT AT BEDTIME 30 tablet 11  . warfarin (COUMADIN) 5 MG tablet Take 1 tablet (5 mg total) by mouth as directed. (Patient taking differently: Take 5 mg by mouth as directed. 5 mg daily and then one-half on Friday) 90 tablet 1  Current Facility-Administered Medications  Medication Dose Route Frequency Provider Last Rate Last Dose  . betamethasone acetate-betamethasone sodium phosphate (CELESTONE) injection 12 mg  12 mg Intramuscular Once Edrick Kins, DPM        OBJECTIVE: There were no vitals filed for this visit.   There is no height or weight on file to calculate BMI.    ECOG FS:0 - Asymptomatic  General: Well-developed, well-nourished, no acute distress. Eyes: Pink conjunctiva, anicteric sclera. Breasts: Patient requested exam be deferred today. Lungs: Clear to auscultation bilaterally. Heart: Regular rate and rhythm. No rubs, murmurs, or gallops. Abdomen: Soft, nontender, nondistended. No organomegaly noted, normoactive bowel sounds. Musculoskeletal: No edema, cyanosis, or clubbing. Neuro: Alert, answering all questions appropriately. Cranial nerves grossly intact. Skin: No rashes or petechiae noted. Psych: Normal affect.   LAB RESULTS:  Lab Results  Component Value Date   NA 140 03/26/2016   K 4.5 03/26/2016   CL 102 03/26/2016   CO2 32 03/26/2016   GLUCOSE 92 03/26/2016   BUN 18 03/26/2016   CREATININE 1.05 03/26/2016   CALCIUM 9.5 03/26/2016   PROT 7.2 04/17/2015   ALBUMIN 4.3 04/17/2015   AST 18 04/17/2015   ALT  21 04/17/2015   ALKPHOS 112 04/17/2015   BILITOT 1.0 04/17/2015   GFRNONAA >60 11/24/2015   GFRAA >60 11/24/2015    Lab Results  Component Value Date   WBC 6.0 03/07/2016   NEUTROABS 4.4 11/24/2015   HGB 14.1 03/07/2016   HCT 41.7 03/07/2016   MCV 83.8 03/07/2016   PLT 216 03/07/2016     STUDIES: No results found.  ASSESSMENT: DCIS right breast.  PLAN:    1. DCIS right breast: Patient has now completed her lumpectomy and adjuvant XRT. Because patient is currently on Coumadin, have initiated letrozole for total 5 years completing treatment in July 2022. Return to clinic in 3 months for further evaluation. 2. Post menopausal: Because patient is initiating aromatase inhibitor, we will get a baseline bone mineral density in the next 1-2 weeks.  Approximately 30 minutes was spent in discussion of which greater than 50% was consultation.  Patient expressed understanding and was in agreement with this plan. She also understands that She can call clinic at any time with any questions, concerns, or complaints.   Lloyd Huger, MD   06/25/2016 11:42 PM

## 2016-06-26 ENCOUNTER — Inpatient Hospital Stay: Payer: PPO | Admitting: Oncology

## 2016-07-02 ENCOUNTER — Encounter: Payer: Self-pay | Admitting: Radiation Oncology

## 2016-07-02 ENCOUNTER — Ambulatory Visit
Admission: RE | Admit: 2016-07-02 | Discharge: 2016-07-02 | Disposition: A | Discharge status: Home / Self Care | Payer: PPO | Source: Ambulatory Visit | Attending: Radiation Oncology | Admitting: Radiation Oncology

## 2016-07-02 VITALS — BP 143/82 | HR 65 | Temp 96.5°F | Wt 291.9 lb

## 2016-07-02 DIAGNOSIS — D0512 Intraductal carcinoma in situ of left breast: Secondary | ICD-10-CM | POA: Diagnosis not present

## 2016-07-02 DIAGNOSIS — Z923 Personal history of irradiation: Secondary | ICD-10-CM | POA: Insufficient documentation

## 2016-07-02 DIAGNOSIS — Z79810 Long term (current) use of selective estrogen receptor modulators (SERMs): Secondary | ICD-10-CM | POA: Diagnosis not present

## 2016-07-02 DIAGNOSIS — Z170 Estrogen receptor positive status [ER+]: Secondary | ICD-10-CM | POA: Insufficient documentation

## 2016-07-02 DIAGNOSIS — D0511 Intraductal carcinoma in situ of right breast: Secondary | ICD-10-CM

## 2016-07-02 DIAGNOSIS — Z9181 History of falling: Secondary | ICD-10-CM | POA: Diagnosis not present

## 2016-07-02 NOTE — Progress Notes (Signed)
Radiation Oncology Follow up Note  Name: Mckenzie Frank   Date:   07/02/2016 MRN:  [id number] DOB: 12-21-1940    This 75 y.o. female presents to the clinic for 4 month follow-up for whole breast radiation for ductal carcinoma in situ of the left breast.  REFERRING PROVIDER: Ria Bush, MD  HPI: Patient is a 75 year old female now out 4 months having completed whole breast radiation to her left breast for ER/PR positive ductal carcinoma in situ. She seen today in routine follow-up and is doing well. She had a recent fall did bruise her breast although there is no obvious trauma or discomfort at this time. She's currently on tamoxifen tolerating that well without side effect.. She or he has follow-up mammograms ordered.  COMPLICATIONS OF TREATMENT: none  FOLLOW UP COMPLIANCE: keeps appointments   PHYSICAL EXAM:  BP (!) 143/82   Pulse 65   Temp (!) 96.5 F (35.8 C)   Wt 291 lb 14.2 oz (132.4 kg)   BMI 48.57 kg/m  Lungs are clear to A&P cardiac examination essentially unremarkable with regular rate and rhythm. No dominant mass or nodularity is noted in either breast in 2 positions examined. Incision is well-healed. No axillary or supraclavicular adenopathy is appreciated. Cosmetic result is excellent. Well-developed well-nourished patient in NAD. HEENT reveals PERLA, EOMI, discs not visualized.  Oral cavity is clear. No oral mucosal lesions are identified. Neck is clear without evidence of cervical or supraclavicular adenopathy. Lungs are clear to A&P. Cardiac examination is essentially unremarkable with regular rate and rhythm without murmur rub or thrill. Abdomen is benign with no organomegaly or masses noted. Motor sensory and DTR levels are equal and symmetric in the upper and lower extremities. Cranial nerves II through XII are grossly intact. Proprioception is intact. No peripheral adenopathy or edema is identified. No motor or sensory levels are noted. Crude visual fields are  within normal range.  RADIOLOGY RESULTS: No current films for review  PLAN: At the present time she continues to do well with no evidence of disease. I'm please were overall progress. I have asked to see her back in 6 months for follow-up. She will help follow-up mammograms prior to her next visit. Patient knows to call sooner with any concerns.  I would like to take this opportunity to thank you for allowing me to participate in the care of your patient.Armstead Peaks., MD

## 2016-07-07 NOTE — Progress Notes (Signed)
[place]  [phone] [phone]  ID: Mckenzie Frank OB: May 27, 1941  MR#: [id number]  [id number]  Patient Care Team: Ria Bush, MD as PCP - General (Family Medicine) Wellington Hampshire, MD as Consulting Physician (Cardiology)  CHIEF COMPLAINT: Right breast DCIS.  INTERVAL HISTORY: Patient returns to clinic today for routine 3 month follow-up. She is tolerating letrozole well without significant side effects. She currently feels well and is asymptomatic. She has no neurologic complaints. She denies any recent fevers or illnesses. She has a good appetite and denies weight loss. She has no chest pain or shortness of breath. She denies any nausea, vomiting, constipation, or diarrhea. She has no urinary complaints. Patient feels at her baseline and offers no specific complaints today.  REVIEW OF SYSTEMS:   Review of Systems  Constitutional: Negative.  Negative for fever, malaise/fatigue and weight loss.  Respiratory: Negative.  Negative for cough and shortness of breath.   Cardiovascular: Positive for leg swelling. Negative for chest pain.  Gastrointestinal: Negative.  Negative for abdominal pain.  Genitourinary: Negative.   Musculoskeletal: Negative.   Neurological: Positive for sensory change. Negative for weakness.  Psychiatric/Behavioral: Negative.  The patient is not nervous/anxious.     As per HPI. Otherwise, a complete review of systems is negative.  PAST MEDICAL HISTORY: Past Medical History:  Diagnosis Date  . Abnormal mammogram    left medial breast - benign fat necrosis with residual calcification  . Atrial fibrillation ([place]) 2010   s/p DC cardioversion. on coumadin  . Benign head tremor   . Chronic bronchitis ([place])    "used to get it alot; not so much anymore" (08/27/2013)  . Dysrhythmia    a-fib, now NSR  . GERD (gastroesophageal reflux disease)   . H/O hiatal hernia    moderate sized/notes  . History of  diverticulitis of colon 08/2013   sigmoid with possible microperf/abscess s/p hospitalization complicated by rectus sheath hematoma during lovenox bridge, leading to ARF from hematoma compression on kidney  . Hyperlipidemia   . Hypertension   . Hypothyroidism    borderline  . Insomnia   . Last menstrual period (LMP) > 10 days ago 1971  . Osteoarthritis of knee    s/p replacement  . Rectus sheath hematoma 08/27/2013   R after bridging with lovenox  . RSV bronchitis 09/13/2013  . Shortness of breath dyspnea   . Sleep apnea    states she wears her CPAP nightly    PAST SURGICAL HISTORY: Past Surgical History:  Procedure Laterality Date  . ABDOMINAL EXPLORATION SURGERY  1971   "to see if I needed hysterectomy; dr thought qthing looked too good so he didn't do a hysterectomy at this time" (08/27/2013)  . APPENDECTOMY  1947  . BREAST DUCTAL SYSTEM EXCISION Right 11/27/2015   Procedure: RIGHT  MAJOR DUCT  EXCISION;  Surgeon: Stark Klein, MD;  Location: [facility];  Service: General;  Laterality: Right;  . CARDIAC CATHETERIZATION     [facility];Dr. Clayborn Bigness  . CARDIOVERSION     Archie Endo 08/27/2013  . CHOLECYSTECTOMY  1980  . dexa  10/2012   WNL  . DILATION AND CURETTAGE OF UTERUS    . ELECTROPHYSIOLOGIC STUDY N/A 05/25/2015   Procedure: Cardioversion;  Surgeon: Wellington Hampshire, MD;  Location: [facility];  Service: Cardiovascular;  Laterality: N/A;  . JOINT REPLACEMENT    . PERCUTANEOUS NEPHROSTOMY  08/2013   ARF from hematoma compression  . REPLACEMENT TOTAL KNEE Bilateral 2005  .  TONSILLECTOMY    . VAGINAL HYSTERECTOMY  1971   "2 wks after 1st dr said it was ok" (08/27/2013)    FAMILY HISTORY Family History  Problem Relation Age of Onset  . CAD Mother     angina  . Diabetes Mother   . Stroke Mother 82  . Alzheimer's disease Father     died from PNA  . Cancer Paternal Grandfather     ?       ADVANCED DIRECTIVES:    HEALTH MAINTENANCE: Social History    Substance Use Topics  . Smoking status: Never Smoker  . Smokeless tobacco: Never Used  . Alcohol use No     Colonoscopy:  PAP:  Bone density:  Lipid panel:  Allergies  Allergen Reactions  . Paxil [Paroxetine Hcl] Other (See Comments)    jitteriness Shaking   . Primidone Shortness Of Breath  . Clindamycin/Lincomycin Other (See Comments)    unknown  . Codeine Other (See Comments)    unknown  . Doxycycline Other (See Comments) and Rash    unknown  . Promethazine Other (See Comments) and Diarrhea    unknown  . Sulfa Antibiotics Hives  . Valium Other (See Comments)    unknown    Current Outpatient Prescriptions  Medication Sig Dispense Refill  . acetaminophen (TYLENOL) 500 MG tablet Take 1,000 mg by mouth every 6 (six) hours as needed for moderate pain.    Marland Kitchen amLODipine (NORVASC) 10 MG tablet TAKE 1 TABLET BY MOUTH ONCE A DAY 90 tablet 1  . cholecalciferol (VITAMIN D) 1000 units tablet Take 1,000 Units by mouth daily.    . clonazePAM (KLONOPIN) 0.5 MG tablet Take 1 tablet (0.5 mg total) by mouth at bedtime. 30 tablet 0  . diclofenac sodium (VOLTAREN) 1 % GEL Apply 1 application topically 3 (three) times daily. 1 Tube 1  . flecainide (TAMBOCOR) 150 MG tablet Take 1 tablet (150 mg total) by mouth 2 (two) times daily. 180 tablet 3  . fluticasone (FLONASE) 50 MCG/ACT nasal spray USE 2 SPARYS IN EACH NOSTRIL DAILY 16 g 6  . furosemide (LASIX) 40 MG tablet Take 1 tablet (40 mg total) by mouth daily. 90 tablet 3  . lansoprazole (PREVACID) 15 MG capsule Take 1 capsule (15 mg total) by mouth daily. 90 capsule 1  . letrozole (FEMARA) 2.5 MG tablet Take 1 tablet (2.5 mg total) by mouth daily. 30 tablet 6  . levothyroxine (SYNTHROID, LEVOTHROID) 25 MCG tablet Take 1 tablet (25 mcg total) by mouth daily. 90 tablet 1  . losartan (COZAAR) 50 MG tablet Take 1.5 tablets (75 mg total) by mouth daily. 135 tablet 3  . metoprolol tartrate (LOPRESSOR) 25 MG tablet Take 1 tablet (25 mg total) by  mouth 2 (two) times daily. 180 tablet 3  . NONFORMULARY OR COMPOUNDED ITEM [facility]: Combination Pain Cream - Baclofen 2%, Doxepin 5%, Gabapentin 6%, Topiramate 2%, Pentoxifylline 3%, apply 1-2 grams to affected area 3-4 times daily. 120 each 2  . simvastatin (ZOCOR) 20 MG tablet Take 0.5 tablets (10 mg total) by mouth every evening. 45 tablet 1  . traZODone (DESYREL) 50 MG tablet TAKE 1/2 TO 1 TABLET BY MOUTH EVERY NIGHT AT BEDTIME 30 tablet 11  . warfarin (COUMADIN) 5 MG tablet Take 1 tablet (5 mg total) by mouth as directed. (Patient taking differently: Take 5 mg by mouth as directed. 5 mg daily and then one-half on Friday) 90 tablet 1   Current Facility-Administered Medications  Medication Dose Route Frequency  Provider Last Rate Last Dose  . betamethasone acetate-betamethasone sodium phosphate (CELESTONE) injection 12 mg  12 mg Intramuscular Once Edrick Kins, DPM        OBJECTIVE: Vitals:   07/08/16 0955  BP: (!) 180/93  Pulse: 60  Temp: 97.8 F (36.6 C)     Body mass index is 48.92 kg/m.    ECOG FS:0 - Asymptomatic  General: Well-developed, well-nourished, no acute distress. Eyes: Pink conjunctiva, anicteric sclera. Breasts: Patient requested exam be deferred today. Lungs: Clear to auscultation bilaterally. Heart: Regular rate and rhythm. No rubs, murmurs, or gallops. Abdomen: Soft, nontender, nondistended. No organomegaly noted, normoactive bowel sounds. Musculoskeletal: No edema, cyanosis, or clubbing. Neuro: Alert, answering all questions appropriately. Cranial nerves grossly intact. Skin: No rashes or petechiae noted. Psych: Normal affect.   LAB RESULTS:  Lab Results  Component Value Date   NA 140 03/26/2016   K 4.5 03/26/2016   CL 102 03/26/2016   CO2 32 03/26/2016   GLUCOSE 92 03/26/2016   BUN 18 03/26/2016   CREATININE 1.05 03/26/2016   CALCIUM 9.5 03/26/2016   PROT 7.2 04/17/2015   ALBUMIN 4.3 04/17/2015   AST 18 04/17/2015   ALT 21 04/17/2015     ALKPHOS 112 04/17/2015   BILITOT 1.0 04/17/2015   GFRNONAA >60 11/24/2015   GFRAA >60 11/24/2015    Lab Results  Component Value Date   WBC 6.0 03/07/2016   NEUTROABS 4.4 11/24/2015   HGB 14.1 03/07/2016   HCT 41.7 03/07/2016   MCV 83.8 03/07/2016   PLT 216 03/07/2016     STUDIES: No results found.  ASSESSMENT: DCIS right breast.  PLAN:    1. DCIS right breast: Patient has now completed her lumpectomy and adjuvant XRT. Because patient is currently on Coumadin, have initiated letrozole for total 5 years completing treatment in July 2022. Return to clinic in 6 months for further evaluation. 2. Osteopenia: Bone mineral density dated April 15, 2016 revealed a T score of -1.8. Have recommended calcium and vitamin D supplementation. Repeat in one year. 3. Hypertension: Patient's blood pressure is significantly elevated today. Continue current treatment and evaluation by PCP.  Patient expressed understanding and was in agreement with this plan. She also understands that She can call clinic at any time with any questions, concerns, or complaints.   Lloyd Huger, MD   07/08/2016 10:22 AM

## 2016-07-08 ENCOUNTER — Inpatient Hospital Stay: Payer: PPO | Attending: Oncology | Admitting: Oncology

## 2016-07-08 ENCOUNTER — Encounter: Payer: Self-pay | Admitting: Oncology

## 2016-07-08 VITALS — BP 180/93 | HR 60 | Temp 97.8°F | Wt 294.0 lb

## 2016-07-08 DIAGNOSIS — E785 Hyperlipidemia, unspecified: Secondary | ICD-10-CM | POA: Diagnosis not present

## 2016-07-08 DIAGNOSIS — Z79811 Long term (current) use of aromatase inhibitors: Secondary | ICD-10-CM | POA: Diagnosis not present

## 2016-07-08 DIAGNOSIS — G4730 Sleep apnea, unspecified: Secondary | ICD-10-CM | POA: Diagnosis not present

## 2016-07-08 DIAGNOSIS — K219 Gastro-esophageal reflux disease without esophagitis: Secondary | ICD-10-CM | POA: Insufficient documentation

## 2016-07-08 DIAGNOSIS — E039 Hypothyroidism, unspecified: Secondary | ICD-10-CM | POA: Insufficient documentation

## 2016-07-08 DIAGNOSIS — Z7901 Long term (current) use of anticoagulants: Secondary | ICD-10-CM | POA: Diagnosis not present

## 2016-07-08 DIAGNOSIS — M8580 Other specified disorders of bone density and structure, unspecified site: Secondary | ICD-10-CM | POA: Insufficient documentation

## 2016-07-08 DIAGNOSIS — Z9989 Dependence on other enabling machines and devices: Secondary | ICD-10-CM | POA: Insufficient documentation

## 2016-07-08 DIAGNOSIS — Z809 Family history of malignant neoplasm, unspecified: Secondary | ICD-10-CM | POA: Diagnosis not present

## 2016-07-08 DIAGNOSIS — I4891 Unspecified atrial fibrillation: Secondary | ICD-10-CM

## 2016-07-08 DIAGNOSIS — Z923 Personal history of irradiation: Secondary | ICD-10-CM | POA: Insufficient documentation

## 2016-07-08 DIAGNOSIS — D0511 Intraductal carcinoma in situ of right breast: Secondary | ICD-10-CM | POA: Diagnosis not present

## 2016-07-08 DIAGNOSIS — Z170 Estrogen receptor positive status [ER+]: Secondary | ICD-10-CM | POA: Insufficient documentation

## 2016-07-08 DIAGNOSIS — Z79899 Other long term (current) drug therapy: Secondary | ICD-10-CM | POA: Diagnosis not present

## 2016-07-08 DIAGNOSIS — I10 Essential (primary) hypertension: Secondary | ICD-10-CM | POA: Diagnosis not present

## 2016-07-15 ENCOUNTER — Ambulatory Visit (INDEPENDENT_AMBULATORY_CARE_PROVIDER_SITE_OTHER): Payer: PPO

## 2016-07-15 ENCOUNTER — Other Ambulatory Visit (INDEPENDENT_AMBULATORY_CARE_PROVIDER_SITE_OTHER): Payer: PPO

## 2016-07-15 DIAGNOSIS — Z5181 Encounter for therapeutic drug level monitoring: Secondary | ICD-10-CM

## 2016-07-15 LAB — POCT INR: INR: 2.9

## 2016-07-15 NOTE — Patient Instructions (Signed)
Pre visit review using our clinic review tool, if applicable. No additional management support is needed unless otherwise documented below in the visit note. 

## 2016-07-16 ENCOUNTER — Other Ambulatory Visit: Payer: Self-pay | Admitting: *Deleted

## 2016-07-16 MED ORDER — CLONAZEPAM 0.5 MG PO TABS: 0.5000 mg | ORAL_TABLET | Freq: Every day | ORAL | 0 refills | Status: AC

## 2016-07-16 NOTE — Telephone Encounter (Signed)
Ok to refill? Last filled 05/24/16 #30 0RF 

## 2016-07-16 NOTE — Telephone Encounter (Signed)
plz phone in. 

## 2016-07-17 NOTE — Telephone Encounter (Signed)
Rx called in as directed.   

## 2016-07-20 DIAGNOSIS — S8390XA Sprain of unspecified site of unspecified knee, initial encounter: Secondary | ICD-10-CM | POA: Diagnosis not present

## 2016-07-20 DIAGNOSIS — G4733 Obstructive sleep apnea (adult) (pediatric): Secondary | ICD-10-CM | POA: Diagnosis not present

## 2016-07-24 DIAGNOSIS — M5430 Sciatica, unspecified side: Secondary | ICD-10-CM | POA: Diagnosis not present

## 2016-07-24 DIAGNOSIS — Z881 Allergy status to other antibiotic agents status: Secondary | ICD-10-CM | POA: Diagnosis not present

## 2016-07-24 DIAGNOSIS — Z882 Allergy status to sulfonamides status: Secondary | ICD-10-CM | POA: Diagnosis not present

## 2016-07-24 DIAGNOSIS — W06XXXA Fall from bed, initial encounter: Secondary | ICD-10-CM | POA: Diagnosis not present

## 2016-07-24 DIAGNOSIS — S79911A Unspecified injury of right hip, initial encounter: Secondary | ICD-10-CM | POA: Diagnosis not present

## 2016-07-24 DIAGNOSIS — Z96653 Presence of artificial knee joint, bilateral: Secondary | ICD-10-CM | POA: Diagnosis not present

## 2016-07-24 DIAGNOSIS — E7800 Pure hypercholesterolemia, unspecified: Secondary | ICD-10-CM | POA: Diagnosis not present

## 2016-07-24 DIAGNOSIS — M25551 Pain in right hip: Secondary | ICD-10-CM | POA: Diagnosis not present

## 2016-07-24 DIAGNOSIS — S3993XA Unspecified injury of pelvis, initial encounter: Secondary | ICD-10-CM | POA: Diagnosis not present

## 2016-07-24 DIAGNOSIS — Z9889 Other specified postprocedural states: Secondary | ICD-10-CM | POA: Diagnosis not present

## 2016-07-24 DIAGNOSIS — Z79899 Other long term (current) drug therapy: Secondary | ICD-10-CM | POA: Diagnosis not present

## 2016-07-24 DIAGNOSIS — S39012A Strain of muscle, fascia and tendon of lower back, initial encounter: Secondary | ICD-10-CM | POA: Diagnosis not present

## 2016-07-24 DIAGNOSIS — M47816 Spondylosis without myelopathy or radiculopathy, lumbar region: Secondary | ICD-10-CM | POA: Diagnosis not present

## 2016-07-24 DIAGNOSIS — S79912A Unspecified injury of left hip, initial encounter: Secondary | ICD-10-CM | POA: Diagnosis not present

## 2016-07-24 DIAGNOSIS — Z9049 Acquired absence of other specified parts of digestive tract: Secondary | ICD-10-CM | POA: Diagnosis not present

## 2016-07-24 DIAGNOSIS — Z90710 Acquired absence of both cervix and uterus: Secondary | ICD-10-CM | POA: Diagnosis not present

## 2016-07-24 DIAGNOSIS — M545 Low back pain: Secondary | ICD-10-CM | POA: Diagnosis not present

## 2016-07-24 DIAGNOSIS — Z888 Allergy status to other drugs, medicaments and biological substances status: Secondary | ICD-10-CM | POA: Diagnosis not present

## 2016-07-24 DIAGNOSIS — Z885 Allergy status to narcotic agent status: Secondary | ICD-10-CM | POA: Diagnosis not present

## 2016-07-24 DIAGNOSIS — Z7901 Long term (current) use of anticoagulants: Secondary | ICD-10-CM | POA: Diagnosis not present

## 2016-07-24 DIAGNOSIS — F489 Nonpsychotic mental disorder, unspecified: Secondary | ICD-10-CM | POA: Diagnosis not present

## 2016-07-24 DIAGNOSIS — Z79811 Long term (current) use of aromatase inhibitors: Secondary | ICD-10-CM | POA: Diagnosis not present

## 2016-07-24 DIAGNOSIS — M25552 Pain in left hip: Secondary | ICD-10-CM | POA: Diagnosis not present

## 2016-07-24 DIAGNOSIS — Z7951 Long term (current) use of inhaled steroids: Secondary | ICD-10-CM | POA: Diagnosis not present

## 2016-07-24 DIAGNOSIS — I4891 Unspecified atrial fibrillation: Secondary | ICD-10-CM | POA: Diagnosis not present

## 2016-07-24 DIAGNOSIS — I10 Essential (primary) hypertension: Secondary | ICD-10-CM | POA: Diagnosis not present

## 2016-07-29 DIAGNOSIS — I10 Essential (primary) hypertension: Secondary | ICD-10-CM | POA: Diagnosis not present

## 2016-07-29 DIAGNOSIS — Z7901 Long term (current) use of anticoagulants: Secondary | ICD-10-CM | POA: Diagnosis not present

## 2016-07-29 DIAGNOSIS — M545 Low back pain: Secondary | ICD-10-CM | POA: Diagnosis not present

## 2016-07-29 DIAGNOSIS — E7800 Pure hypercholesterolemia, unspecified: Secondary | ICD-10-CM | POA: Diagnosis not present

## 2016-07-29 DIAGNOSIS — I4891 Unspecified atrial fibrillation: Secondary | ICD-10-CM | POA: Diagnosis not present

## 2016-07-31 ENCOUNTER — Other Ambulatory Visit: Payer: Self-pay | Admitting: *Deleted

## 2016-07-31 MED ORDER — LANSOPRAZOLE 15 MG PO CPDR: 15.0000 mg | DELAYED_RELEASE_CAPSULE | Freq: Every day | ORAL | 1 refills | Status: DC

## 2016-08-16 ENCOUNTER — Ambulatory Visit: Payer: Self-pay | Admitting: Radiation Oncology

## 2016-08-19 DIAGNOSIS — Z7901 Long term (current) use of anticoagulants: Secondary | ICD-10-CM | POA: Diagnosis not present

## 2016-08-19 DIAGNOSIS — G4733 Obstructive sleep apnea (adult) (pediatric): Secondary | ICD-10-CM | POA: Diagnosis not present

## 2016-08-19 DIAGNOSIS — S8390XA Sprain of unspecified site of unspecified knee, initial encounter: Secondary | ICD-10-CM | POA: Diagnosis not present

## 2016-08-27 ENCOUNTER — Ambulatory Visit: Payer: PPO | Admitting: Radiation Oncology

## 2016-09-02 DIAGNOSIS — E7800 Pure hypercholesterolemia, unspecified: Secondary | ICD-10-CM | POA: Diagnosis not present

## 2016-09-02 DIAGNOSIS — E039 Hypothyroidism, unspecified: Secondary | ICD-10-CM | POA: Diagnosis not present

## 2016-09-02 DIAGNOSIS — J123 Human metapneumovirus pneumonia: Secondary | ICD-10-CM | POA: Diagnosis not present

## 2016-09-02 DIAGNOSIS — J441 Chronic obstructive pulmonary disease with (acute) exacerbation: Secondary | ICD-10-CM | POA: Diagnosis not present

## 2016-09-02 DIAGNOSIS — I480 Paroxysmal atrial fibrillation: Secondary | ICD-10-CM | POA: Diagnosis not present

## 2016-09-02 DIAGNOSIS — J9801 Acute bronchospasm: Secondary | ICD-10-CM | POA: Diagnosis not present

## 2016-09-02 DIAGNOSIS — I482 Chronic atrial fibrillation: Secondary | ICD-10-CM | POA: Diagnosis not present

## 2016-09-02 DIAGNOSIS — R05 Cough: Secondary | ICD-10-CM | POA: Diagnosis not present

## 2016-09-02 DIAGNOSIS — J189 Pneumonia, unspecified organism: Secondary | ICD-10-CM | POA: Diagnosis not present

## 2016-09-02 DIAGNOSIS — Z888 Allergy status to other drugs, medicaments and biological substances status: Secondary | ICD-10-CM | POA: Diagnosis not present

## 2016-09-02 DIAGNOSIS — Z885 Allergy status to narcotic agent status: Secondary | ICD-10-CM | POA: Diagnosis not present

## 2016-09-02 DIAGNOSIS — R0600 Dyspnea, unspecified: Secondary | ICD-10-CM | POA: Diagnosis not present

## 2016-09-02 DIAGNOSIS — R0602 Shortness of breath: Secondary | ICD-10-CM | POA: Diagnosis not present

## 2016-09-02 DIAGNOSIS — K219 Gastro-esophageal reflux disease without esophagitis: Secondary | ICD-10-CM | POA: Diagnosis not present

## 2016-09-02 DIAGNOSIS — R062 Wheezing: Secondary | ICD-10-CM | POA: Diagnosis not present

## 2016-09-02 DIAGNOSIS — F5104 Psychophysiologic insomnia: Secondary | ICD-10-CM | POA: Diagnosis not present

## 2016-09-02 DIAGNOSIS — R918 Other nonspecific abnormal finding of lung field: Secondary | ICD-10-CM | POA: Diagnosis not present

## 2016-09-02 DIAGNOSIS — Z7901 Long term (current) use of anticoagulants: Secondary | ICD-10-CM | POA: Diagnosis not present

## 2016-09-02 DIAGNOSIS — Z882 Allergy status to sulfonamides status: Secondary | ICD-10-CM | POA: Diagnosis not present

## 2016-09-02 DIAGNOSIS — I160 Hypertensive urgency: Secondary | ICD-10-CM | POA: Diagnosis not present

## 2016-09-03 DIAGNOSIS — I10 Essential (primary) hypertension: Secondary | ICD-10-CM | POA: Diagnosis not present

## 2016-09-03 DIAGNOSIS — I482 Chronic atrial fibrillation: Secondary | ICD-10-CM | POA: Diagnosis not present

## 2016-09-03 DIAGNOSIS — Z882 Allergy status to sulfonamides status: Secondary | ICD-10-CM | POA: Diagnosis not present

## 2016-09-03 DIAGNOSIS — F5104 Psychophysiologic insomnia: Secondary | ICD-10-CM | POA: Diagnosis not present

## 2016-09-03 DIAGNOSIS — J189 Pneumonia, unspecified organism: Secondary | ICD-10-CM | POA: Diagnosis not present

## 2016-09-03 DIAGNOSIS — J9801 Acute bronchospasm: Secondary | ICD-10-CM | POA: Diagnosis not present

## 2016-09-03 DIAGNOSIS — I160 Hypertensive urgency: Secondary | ICD-10-CM | POA: Diagnosis not present

## 2016-09-03 DIAGNOSIS — Z7901 Long term (current) use of anticoagulants: Secondary | ICD-10-CM | POA: Diagnosis not present

## 2016-09-03 DIAGNOSIS — J123 Human metapneumovirus pneumonia: Secondary | ICD-10-CM | POA: Diagnosis not present

## 2016-09-03 DIAGNOSIS — E7800 Pure hypercholesterolemia, unspecified: Secondary | ICD-10-CM | POA: Diagnosis not present

## 2016-09-03 DIAGNOSIS — K219 Gastro-esophageal reflux disease without esophagitis: Secondary | ICD-10-CM | POA: Diagnosis not present

## 2016-09-03 DIAGNOSIS — Z888 Allergy status to other drugs, medicaments and biological substances status: Secondary | ICD-10-CM | POA: Diagnosis not present

## 2016-09-03 DIAGNOSIS — Z885 Allergy status to narcotic agent status: Secondary | ICD-10-CM | POA: Diagnosis not present

## 2016-09-03 DIAGNOSIS — E039 Hypothyroidism, unspecified: Secondary | ICD-10-CM | POA: Diagnosis not present

## 2016-09-04 DIAGNOSIS — J123 Human metapneumovirus pneumonia: Secondary | ICD-10-CM | POA: Diagnosis not present

## 2016-09-04 DIAGNOSIS — Z882 Allergy status to sulfonamides status: Secondary | ICD-10-CM | POA: Diagnosis not present

## 2016-09-04 DIAGNOSIS — J9801 Acute bronchospasm: Secondary | ICD-10-CM | POA: Diagnosis not present

## 2016-09-04 DIAGNOSIS — Z885 Allergy status to narcotic agent status: Secondary | ICD-10-CM | POA: Diagnosis not present

## 2016-09-04 DIAGNOSIS — F5104 Psychophysiologic insomnia: Secondary | ICD-10-CM | POA: Diagnosis not present

## 2016-09-04 DIAGNOSIS — E7800 Pure hypercholesterolemia, unspecified: Secondary | ICD-10-CM | POA: Diagnosis not present

## 2016-09-04 DIAGNOSIS — I482 Chronic atrial fibrillation: Secondary | ICD-10-CM | POA: Diagnosis not present

## 2016-09-04 DIAGNOSIS — K219 Gastro-esophageal reflux disease without esophagitis: Secondary | ICD-10-CM | POA: Diagnosis not present

## 2016-09-04 DIAGNOSIS — Z888 Allergy status to other drugs, medicaments and biological substances status: Secondary | ICD-10-CM | POA: Diagnosis not present

## 2016-09-04 DIAGNOSIS — E039 Hypothyroidism, unspecified: Secondary | ICD-10-CM | POA: Diagnosis not present

## 2016-09-04 DIAGNOSIS — I160 Hypertensive urgency: Secondary | ICD-10-CM | POA: Diagnosis not present

## 2016-09-04 DIAGNOSIS — J189 Pneumonia, unspecified organism: Secondary | ICD-10-CM | POA: Diagnosis not present

## 2016-09-04 DIAGNOSIS — I10 Essential (primary) hypertension: Secondary | ICD-10-CM | POA: Diagnosis not present

## 2016-09-04 DIAGNOSIS — Z7901 Long term (current) use of anticoagulants: Secondary | ICD-10-CM | POA: Diagnosis not present

## 2016-09-05 DIAGNOSIS — J189 Pneumonia, unspecified organism: Secondary | ICD-10-CM | POA: Diagnosis not present

## 2016-09-05 DIAGNOSIS — J9801 Acute bronchospasm: Secondary | ICD-10-CM | POA: Diagnosis not present

## 2016-09-05 DIAGNOSIS — I10 Essential (primary) hypertension: Secondary | ICD-10-CM | POA: Diagnosis not present

## 2016-09-05 DIAGNOSIS — I160 Hypertensive urgency: Secondary | ICD-10-CM | POA: Diagnosis not present

## 2016-09-05 DIAGNOSIS — R0602 Shortness of breath: Secondary | ICD-10-CM | POA: Diagnosis not present

## 2016-09-06 DIAGNOSIS — J189 Pneumonia, unspecified organism: Secondary | ICD-10-CM | POA: Diagnosis not present

## 2016-09-06 DIAGNOSIS — J9801 Acute bronchospasm: Secondary | ICD-10-CM | POA: Diagnosis not present

## 2016-09-06 DIAGNOSIS — I160 Hypertensive urgency: Secondary | ICD-10-CM | POA: Diagnosis not present

## 2016-09-06 DIAGNOSIS — R918 Other nonspecific abnormal finding of lung field: Secondary | ICD-10-CM | POA: Diagnosis not present

## 2016-09-06 DIAGNOSIS — I10 Essential (primary) hypertension: Secondary | ICD-10-CM | POA: Diagnosis not present

## 2016-09-07 DIAGNOSIS — I10 Essential (primary) hypertension: Secondary | ICD-10-CM | POA: Diagnosis not present

## 2016-09-07 DIAGNOSIS — J189 Pneumonia, unspecified organism: Secondary | ICD-10-CM | POA: Diagnosis not present

## 2016-09-07 DIAGNOSIS — J9801 Acute bronchospasm: Secondary | ICD-10-CM | POA: Diagnosis not present

## 2016-09-07 DIAGNOSIS — R0602 Shortness of breath: Secondary | ICD-10-CM | POA: Diagnosis not present

## 2016-09-07 DIAGNOSIS — K219 Gastro-esophageal reflux disease without esophagitis: Secondary | ICD-10-CM | POA: Diagnosis not present

## 2016-09-08 DIAGNOSIS — K219 Gastro-esophageal reflux disease without esophagitis: Secondary | ICD-10-CM | POA: Diagnosis not present

## 2016-09-08 DIAGNOSIS — J189 Pneumonia, unspecified organism: Secondary | ICD-10-CM | POA: Diagnosis not present

## 2016-09-08 DIAGNOSIS — J9801 Acute bronchospasm: Secondary | ICD-10-CM | POA: Diagnosis not present

## 2016-09-08 DIAGNOSIS — I10 Essential (primary) hypertension: Secondary | ICD-10-CM | POA: Diagnosis not present

## 2016-09-09 DIAGNOSIS — K219 Gastro-esophageal reflux disease without esophagitis: Secondary | ICD-10-CM | POA: Diagnosis not present

## 2016-09-09 DIAGNOSIS — J189 Pneumonia, unspecified organism: Secondary | ICD-10-CM | POA: Diagnosis not present

## 2016-09-09 DIAGNOSIS — J9801 Acute bronchospasm: Secondary | ICD-10-CM | POA: Diagnosis not present

## 2016-09-09 DIAGNOSIS — I10 Essential (primary) hypertension: Secondary | ICD-10-CM | POA: Diagnosis not present

## 2016-09-10 DIAGNOSIS — I160 Hypertensive urgency: Secondary | ICD-10-CM | POA: Diagnosis not present

## 2016-09-10 DIAGNOSIS — J189 Pneumonia, unspecified organism: Secondary | ICD-10-CM | POA: Diagnosis not present

## 2016-09-13 DIAGNOSIS — I4891 Unspecified atrial fibrillation: Secondary | ICD-10-CM | POA: Diagnosis not present

## 2016-09-13 DIAGNOSIS — Z7901 Long term (current) use of anticoagulants: Secondary | ICD-10-CM | POA: Diagnosis not present

## 2016-09-13 DIAGNOSIS — I10 Essential (primary) hypertension: Secondary | ICD-10-CM | POA: Diagnosis not present

## 2016-09-13 DIAGNOSIS — J159 Unspecified bacterial pneumonia: Secondary | ICD-10-CM | POA: Diagnosis not present

## 2016-09-13 DIAGNOSIS — E7800 Pure hypercholesterolemia, unspecified: Secondary | ICD-10-CM | POA: Diagnosis not present

## 2016-09-16 ENCOUNTER — Telehealth: Payer: Self-pay | Admitting: *Deleted

## 2016-09-16 DIAGNOSIS — D0511 Intraductal carcinoma in situ of right breast: Secondary | ICD-10-CM

## 2016-09-16 MED ORDER — LETROZOLE 2.5 MG PO TABS: 2.5000 mg | ORAL_TABLET | Freq: Every day | ORAL | 3 refills | Status: AC

## 2016-09-16 NOTE — Telephone Encounter (Signed)
90 day supply of letrozole escribed to [facility] in [place], [place].

## 2016-09-19 DIAGNOSIS — G4733 Obstructive sleep apnea (adult) (pediatric): Secondary | ICD-10-CM | POA: Diagnosis not present

## 2016-09-19 DIAGNOSIS — S8390XA Sprain of unspecified site of unspecified knee, initial encounter: Secondary | ICD-10-CM | POA: Diagnosis not present

## 2016-09-20 DIAGNOSIS — G4733 Obstructive sleep apnea (adult) (pediatric): Secondary | ICD-10-CM | POA: Diagnosis not present

## 2016-09-20 DIAGNOSIS — J9801 Acute bronchospasm: Secondary | ICD-10-CM | POA: Diagnosis not present

## 2016-09-20 DIAGNOSIS — S8390XA Sprain of unspecified site of unspecified knee, initial encounter: Secondary | ICD-10-CM | POA: Diagnosis not present

## 2016-09-20 DIAGNOSIS — J129 Viral pneumonia, unspecified: Secondary | ICD-10-CM | POA: Diagnosis not present

## 2016-09-20 DIAGNOSIS — J449 Chronic obstructive pulmonary disease, unspecified: Secondary | ICD-10-CM | POA: Diagnosis not present

## 2016-09-27 ENCOUNTER — Telehealth: Payer: Self-pay | Admitting: Cardiovascular Disease

## 2016-09-27 NOTE — Telephone Encounter (Signed)
Patient not interested in fu with [facility].  She had to move back to [place].  Deleting recall.

## 2016-10-01 DIAGNOSIS — Z23 Encounter for immunization: Secondary | ICD-10-CM | POA: Diagnosis not present

## 2016-10-12 DIAGNOSIS — I480 Paroxysmal atrial fibrillation: Secondary | ICD-10-CM | POA: Diagnosis not present

## 2016-10-12 DIAGNOSIS — I4891 Unspecified atrial fibrillation: Secondary | ICD-10-CM | POA: Diagnosis not present

## 2016-10-12 DIAGNOSIS — G4733 Obstructive sleep apnea (adult) (pediatric): Secondary | ICD-10-CM | POA: Diagnosis not present

## 2016-10-12 DIAGNOSIS — E876 Hypokalemia: Secondary | ICD-10-CM | POA: Diagnosis not present

## 2016-10-12 DIAGNOSIS — R0902 Hypoxemia: Secondary | ICD-10-CM | POA: Diagnosis not present

## 2016-10-12 DIAGNOSIS — K219 Gastro-esophageal reflux disease without esophagitis: Secondary | ICD-10-CM | POA: Diagnosis not present

## 2016-10-12 DIAGNOSIS — R0600 Dyspnea, unspecified: Secondary | ICD-10-CM | POA: Diagnosis not present

## 2016-10-12 DIAGNOSIS — Z6830 Body mass index (BMI) 30.0-30.9, adult: Secondary | ICD-10-CM | POA: Diagnosis not present

## 2016-10-12 DIAGNOSIS — E8770 Fluid overload, unspecified: Secondary | ICD-10-CM | POA: Diagnosis not present

## 2016-10-12 DIAGNOSIS — I110 Hypertensive heart disease with heart failure: Secondary | ICD-10-CM | POA: Diagnosis not present

## 2016-10-12 DIAGNOSIS — R062 Wheezing: Secondary | ICD-10-CM | POA: Diagnosis not present

## 2016-10-12 DIAGNOSIS — I509 Heart failure, unspecified: Secondary | ICD-10-CM | POA: Diagnosis not present

## 2016-10-12 DIAGNOSIS — I10 Essential (primary) hypertension: Secondary | ICD-10-CM | POA: Diagnosis not present

## 2016-10-12 DIAGNOSIS — E039 Hypothyroidism, unspecified: Secondary | ICD-10-CM | POA: Diagnosis not present

## 2016-10-12 DIAGNOSIS — Z6839 Body mass index (BMI) 39.0-39.9, adult: Secondary | ICD-10-CM | POA: Diagnosis not present

## 2016-10-12 DIAGNOSIS — E785 Hyperlipidemia, unspecified: Secondary | ICD-10-CM | POA: Diagnosis not present

## 2016-10-12 DIAGNOSIS — Z7901 Long term (current) use of anticoagulants: Secondary | ICD-10-CM | POA: Diagnosis not present

## 2016-10-12 DIAGNOSIS — I340 Nonrheumatic mitral (valve) insufficiency: Secondary | ICD-10-CM | POA: Diagnosis not present

## 2016-10-12 DIAGNOSIS — Z79899 Other long term (current) drug therapy: Secondary | ICD-10-CM | POA: Diagnosis not present

## 2016-10-13 DIAGNOSIS — I10 Essential (primary) hypertension: Secondary | ICD-10-CM | POA: Diagnosis not present

## 2016-10-13 DIAGNOSIS — E039 Hypothyroidism, unspecified: Secondary | ICD-10-CM | POA: Diagnosis not present

## 2016-10-13 DIAGNOSIS — R0602 Shortness of breath: Secondary | ICD-10-CM | POA: Diagnosis not present

## 2016-10-13 DIAGNOSIS — I481 Persistent atrial fibrillation: Secondary | ICD-10-CM | POA: Diagnosis not present

## 2016-10-13 DIAGNOSIS — E669 Obesity, unspecified: Secondary | ICD-10-CM | POA: Diagnosis not present

## 2016-10-14 DIAGNOSIS — I10 Essential (primary) hypertension: Secondary | ICD-10-CM | POA: Diagnosis not present

## 2016-10-14 DIAGNOSIS — I080 Rheumatic disorders of both mitral and aortic valves: Secondary | ICD-10-CM | POA: Diagnosis not present

## 2016-10-14 DIAGNOSIS — E669 Obesity, unspecified: Secondary | ICD-10-CM | POA: Diagnosis not present

## 2016-10-14 DIAGNOSIS — E039 Hypothyroidism, unspecified: Secondary | ICD-10-CM | POA: Diagnosis not present

## 2016-10-14 DIAGNOSIS — I481 Persistent atrial fibrillation: Secondary | ICD-10-CM | POA: Diagnosis not present

## 2016-10-14 DIAGNOSIS — I517 Cardiomegaly: Secondary | ICD-10-CM | POA: Diagnosis not present

## 2016-10-15 DIAGNOSIS — I517 Cardiomegaly: Secondary | ICD-10-CM | POA: Diagnosis not present

## 2016-10-15 DIAGNOSIS — I10 Essential (primary) hypertension: Secondary | ICD-10-CM | POA: Diagnosis not present

## 2016-10-15 DIAGNOSIS — I083 Combined rheumatic disorders of mitral, aortic and tricuspid valves: Secondary | ICD-10-CM | POA: Diagnosis not present

## 2016-10-15 DIAGNOSIS — I481 Persistent atrial fibrillation: Secondary | ICD-10-CM | POA: Diagnosis not present

## 2016-10-15 DIAGNOSIS — J9801 Acute bronchospasm: Secondary | ICD-10-CM | POA: Diagnosis not present

## 2016-10-15 DIAGNOSIS — I4891 Unspecified atrial fibrillation: Secondary | ICD-10-CM | POA: Diagnosis not present

## 2016-10-15 DIAGNOSIS — Z9989 Dependence on other enabling machines and devices: Secondary | ICD-10-CM | POA: Diagnosis not present

## 2016-10-15 DIAGNOSIS — G4733 Obstructive sleep apnea (adult) (pediatric): Secondary | ICD-10-CM | POA: Diagnosis not present

## 2016-10-15 DIAGNOSIS — E876 Hypokalemia: Secondary | ICD-10-CM | POA: Diagnosis not present

## 2016-10-16 DIAGNOSIS — I4891 Unspecified atrial fibrillation: Secondary | ICD-10-CM | POA: Diagnosis not present

## 2016-10-16 DIAGNOSIS — I10 Essential (primary) hypertension: Secondary | ICD-10-CM | POA: Diagnosis not present

## 2016-10-16 DIAGNOSIS — E669 Obesity, unspecified: Secondary | ICD-10-CM | POA: Diagnosis not present

## 2016-10-16 DIAGNOSIS — E039 Hypothyroidism, unspecified: Secondary | ICD-10-CM | POA: Diagnosis not present

## 2016-10-16 DIAGNOSIS — Z6839 Body mass index (BMI) 39.0-39.9, adult: Secondary | ICD-10-CM | POA: Diagnosis not present

## 2016-10-16 DIAGNOSIS — E785 Hyperlipidemia, unspecified: Secondary | ICD-10-CM | POA: Diagnosis not present

## 2016-10-20 DIAGNOSIS — G4733 Obstructive sleep apnea (adult) (pediatric): Secondary | ICD-10-CM | POA: Diagnosis not present

## 2016-10-20 DIAGNOSIS — S8390XA Sprain of unspecified site of unspecified knee, initial encounter: Secondary | ICD-10-CM | POA: Diagnosis not present

## 2016-10-21 DIAGNOSIS — I4891 Unspecified atrial fibrillation: Secondary | ICD-10-CM | POA: Diagnosis not present

## 2016-10-21 DIAGNOSIS — J129 Viral pneumonia, unspecified: Secondary | ICD-10-CM | POA: Diagnosis not present

## 2016-10-21 DIAGNOSIS — J9801 Acute bronchospasm: Secondary | ICD-10-CM | POA: Diagnosis not present

## 2016-10-21 DIAGNOSIS — J449 Chronic obstructive pulmonary disease, unspecified: Secondary | ICD-10-CM | POA: Diagnosis not present

## 2016-10-21 DIAGNOSIS — R0600 Dyspnea, unspecified: Secondary | ICD-10-CM | POA: Diagnosis not present

## 2016-10-21 DIAGNOSIS — Z7901 Long term (current) use of anticoagulants: Secondary | ICD-10-CM | POA: Diagnosis not present

## 2016-10-21 DIAGNOSIS — R21 Rash and other nonspecific skin eruption: Secondary | ICD-10-CM | POA: Diagnosis not present

## 2016-10-21 DIAGNOSIS — I10 Essential (primary) hypertension: Secondary | ICD-10-CM | POA: Diagnosis not present

## 2016-10-21 DIAGNOSIS — E039 Hypothyroidism, unspecified: Secondary | ICD-10-CM | POA: Diagnosis not present

## 2016-10-22 DIAGNOSIS — R0600 Dyspnea, unspecified: Secondary | ICD-10-CM | POA: Diagnosis not present

## 2016-10-30 DIAGNOSIS — Z7901 Long term (current) use of anticoagulants: Secondary | ICD-10-CM | POA: Diagnosis not present

## 2016-10-30 DIAGNOSIS — I4891 Unspecified atrial fibrillation: Secondary | ICD-10-CM | POA: Diagnosis not present

## 2016-10-30 DIAGNOSIS — L570 Actinic keratosis: Secondary | ICD-10-CM | POA: Diagnosis not present

## 2016-10-30 DIAGNOSIS — E039 Hypothyroidism, unspecified: Secondary | ICD-10-CM | POA: Diagnosis not present

## 2016-10-30 DIAGNOSIS — E7800 Pure hypercholesterolemia, unspecified: Secondary | ICD-10-CM | POA: Diagnosis not present

## 2016-10-30 DIAGNOSIS — I10 Essential (primary) hypertension: Secondary | ICD-10-CM | POA: Diagnosis not present

## 2016-10-30 DIAGNOSIS — K219 Gastro-esophageal reflux disease without esophagitis: Secondary | ICD-10-CM | POA: Diagnosis not present

## 2016-11-06 DIAGNOSIS — I10 Essential (primary) hypertension: Secondary | ICD-10-CM | POA: Diagnosis not present

## 2016-11-06 DIAGNOSIS — I480 Paroxysmal atrial fibrillation: Secondary | ICD-10-CM | POA: Diagnosis not present

## 2016-11-18 DIAGNOSIS — J9801 Acute bronchospasm: Secondary | ICD-10-CM | POA: Diagnosis not present

## 2016-11-18 DIAGNOSIS — J449 Chronic obstructive pulmonary disease, unspecified: Secondary | ICD-10-CM | POA: Diagnosis not present

## 2016-11-18 DIAGNOSIS — J129 Viral pneumonia, unspecified: Secondary | ICD-10-CM | POA: Diagnosis not present

## 2016-12-10 ENCOUNTER — Other Ambulatory Visit: Payer: Self-pay | Admitting: Family Medicine

## 2016-12-19 DIAGNOSIS — J9801 Acute bronchospasm: Secondary | ICD-10-CM | POA: Diagnosis not present

## 2016-12-19 DIAGNOSIS — J449 Chronic obstructive pulmonary disease, unspecified: Secondary | ICD-10-CM | POA: Diagnosis not present

## 2016-12-19 DIAGNOSIS — J129 Viral pneumonia, unspecified: Secondary | ICD-10-CM | POA: Diagnosis not present

## 2017-01-06 ENCOUNTER — Ambulatory Visit: Payer: PPO | Admitting: Radiation Oncology

## 2017-01-06 ENCOUNTER — Inpatient Hospital Stay: Payer: PPO | Admitting: Oncology

## 2017-01-14 DIAGNOSIS — G8929 Other chronic pain: Secondary | ICD-10-CM | POA: Diagnosis not present

## 2017-01-14 DIAGNOSIS — I4891 Unspecified atrial fibrillation: Secondary | ICD-10-CM | POA: Diagnosis not present

## 2017-01-14 DIAGNOSIS — R197 Diarrhea, unspecified: Secondary | ICD-10-CM | POA: Diagnosis not present

## 2017-01-14 DIAGNOSIS — M549 Dorsalgia, unspecified: Secondary | ICD-10-CM | POA: Diagnosis not present

## 2017-01-14 DIAGNOSIS — Z7901 Long term (current) use of anticoagulants: Secondary | ICD-10-CM | POA: Diagnosis not present

## 2017-01-18 DIAGNOSIS — J9801 Acute bronchospasm: Secondary | ICD-10-CM | POA: Diagnosis not present

## 2017-01-18 DIAGNOSIS — J449 Chronic obstructive pulmonary disease, unspecified: Secondary | ICD-10-CM | POA: Diagnosis not present

## 2017-01-18 DIAGNOSIS — J129 Viral pneumonia, unspecified: Secondary | ICD-10-CM | POA: Diagnosis not present

## 2017-02-05 DIAGNOSIS — E7800 Pure hypercholesterolemia, unspecified: Secondary | ICD-10-CM | POA: Diagnosis not present

## 2017-02-05 DIAGNOSIS — M5136 Other intervertebral disc degeneration, lumbar region: Secondary | ICD-10-CM | POA: Diagnosis not present

## 2017-02-05 DIAGNOSIS — Z7901 Long term (current) use of anticoagulants: Secondary | ICD-10-CM | POA: Diagnosis not present

## 2017-02-05 DIAGNOSIS — M545 Low back pain: Secondary | ICD-10-CM | POA: Diagnosis not present

## 2017-02-05 DIAGNOSIS — I10 Essential (primary) hypertension: Secondary | ICD-10-CM | POA: Diagnosis not present

## 2017-02-05 DIAGNOSIS — M549 Dorsalgia, unspecified: Secondary | ICD-10-CM | POA: Diagnosis not present

## 2017-02-05 DIAGNOSIS — G8929 Other chronic pain: Secondary | ICD-10-CM | POA: Diagnosis not present

## 2017-02-05 DIAGNOSIS — M4316 Spondylolisthesis, lumbar region: Secondary | ICD-10-CM | POA: Diagnosis not present

## 2017-02-10 ENCOUNTER — Other Ambulatory Visit: Payer: Self-pay | Admitting: Family Medicine

## 2017-02-10 NOTE — Telephone Encounter (Signed)
CPE 04/05/2016, none sch.  Last rx 07/31/16 QD #90 +1. Would you like to refill?

## 2017-02-12 DIAGNOSIS — I480 Paroxysmal atrial fibrillation: Secondary | ICD-10-CM | POA: Diagnosis not present

## 2017-02-12 DIAGNOSIS — I340 Nonrheumatic mitral (valve) insufficiency: Secondary | ICD-10-CM | POA: Diagnosis not present

## 2017-02-18 DIAGNOSIS — J129 Viral pneumonia, unspecified: Secondary | ICD-10-CM | POA: Diagnosis not present

## 2017-02-18 DIAGNOSIS — J449 Chronic obstructive pulmonary disease, unspecified: Secondary | ICD-10-CM | POA: Diagnosis not present

## 2017-02-18 DIAGNOSIS — J9801 Acute bronchospasm: Secondary | ICD-10-CM | POA: Diagnosis not present

## 2017-02-25 DIAGNOSIS — M545 Low back pain: Secondary | ICD-10-CM | POA: Diagnosis not present

## 2017-02-25 DIAGNOSIS — M5416 Radiculopathy, lumbar region: Secondary | ICD-10-CM | POA: Diagnosis not present

## 2017-05-20 ENCOUNTER — Other Ambulatory Visit: Payer: Self-pay | Admitting: Family Medicine

## 2017-07-11 ENCOUNTER — Telehealth: Payer: Self-pay | Admitting: Internal Medicine

## 2017-07-11 NOTE — Telephone Encounter (Signed)
Attempted to schedule fu from recall patient declined she is seeing another md  Deleting recall

## 2017-08-04 ENCOUNTER — Other Ambulatory Visit: Payer: Self-pay | Admitting: Family Medicine

## 2017-08-04 NOTE — Telephone Encounter (Signed)
Last filled:  02/27/17, #30 Last OV:  05/03/16 Next OV:  none

## 2017-08-06 NOTE — Telephone Encounter (Signed)
Sent to pharmacy 

## 2017-09-03 ENCOUNTER — Encounter: Payer: Self-pay | Admitting: Pulmonary Disease

## 2017-09-08 ENCOUNTER — Institutional Professional Consult (permissible substitution): Payer: PPO | Admitting: Pulmonary Disease

## 2017-09-11 ENCOUNTER — Ambulatory Visit: Payer: Self-pay | Admitting: General Practice

## 2017-12-02 IMAGING — MG MM DUCTOGRAM UNILATERAL*R*
5 series · 5 of 5 positions shown · IV contrast (omnipaque)
Comparison: none

ADDENDUM:
Less than 1 cc of Omnipaque 300 was injected into the discharging
duct.
CLINICAL DATA: Single duct bloody nipple discharge. The discharge
emanates from a duct in the lower outer quadrant of the right
nipple.

EXAM:
DUCTOGRAM RIGHT BREAST

[R ML (1 of 3)]
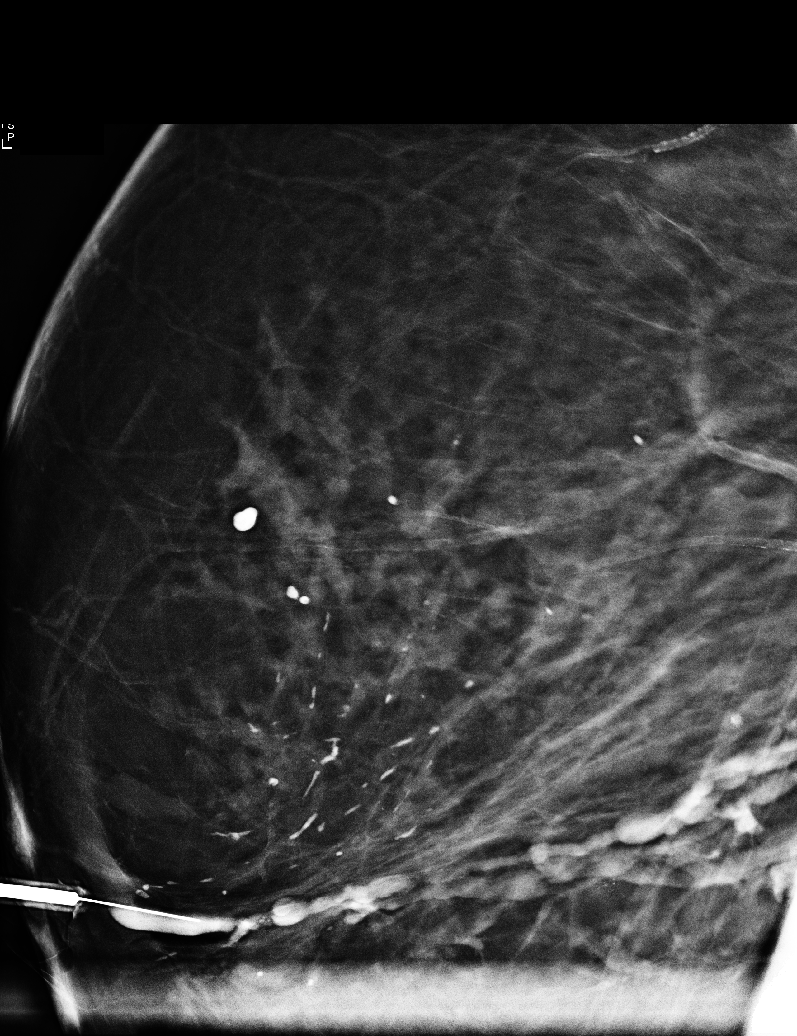

[R CC (1 of 2)]
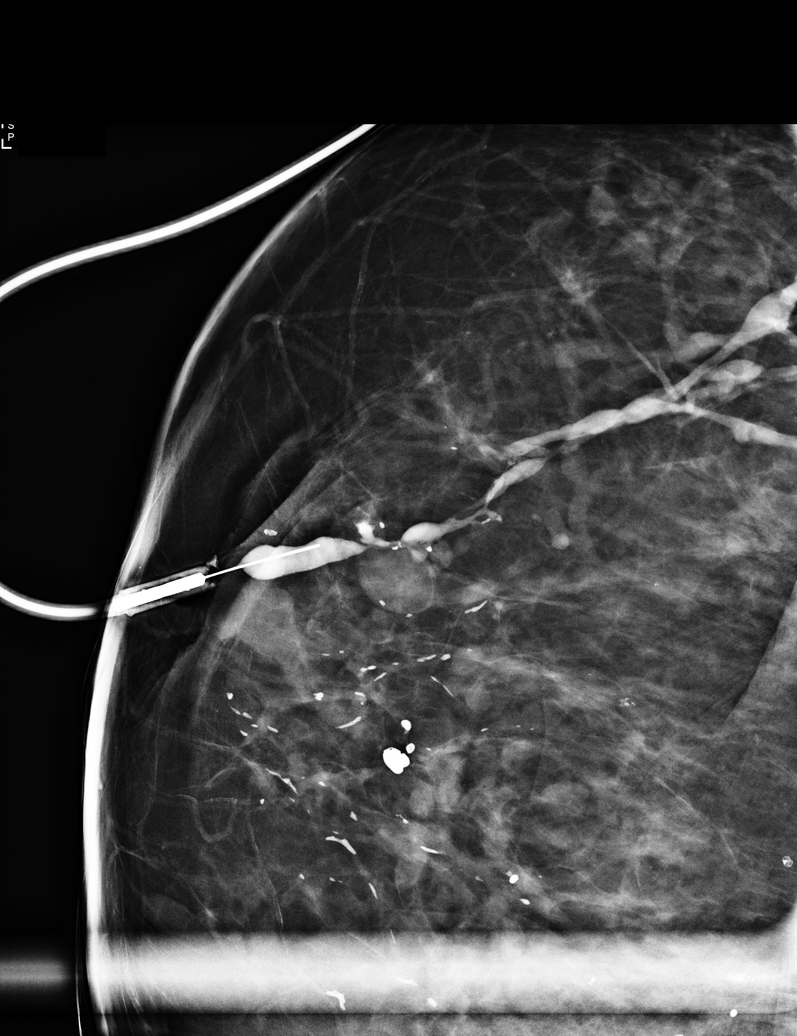

[R ML (2 of 3)]
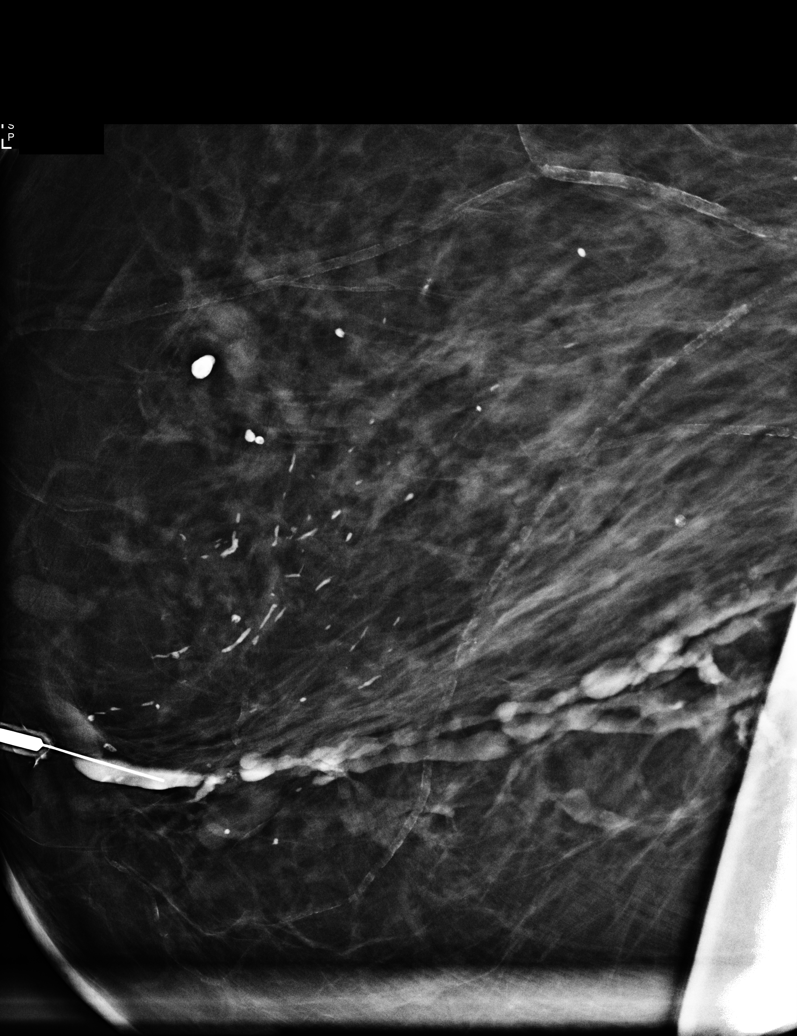

[R ML (3 of 3)]
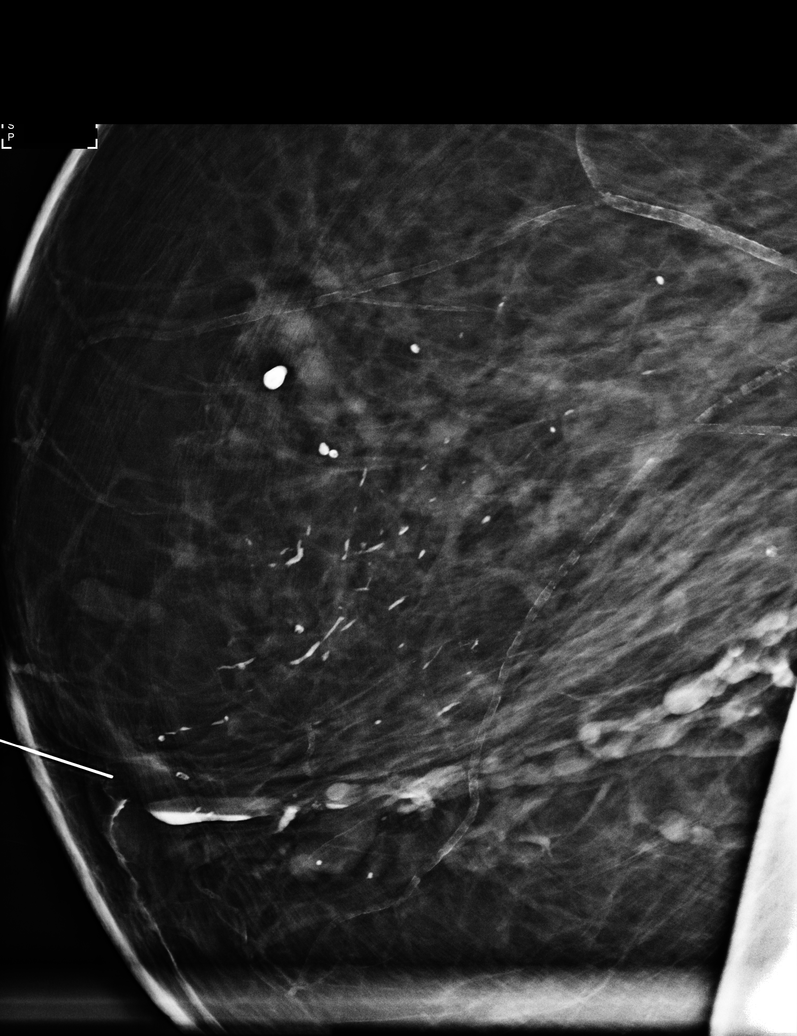

[R CC (2 of 2)]
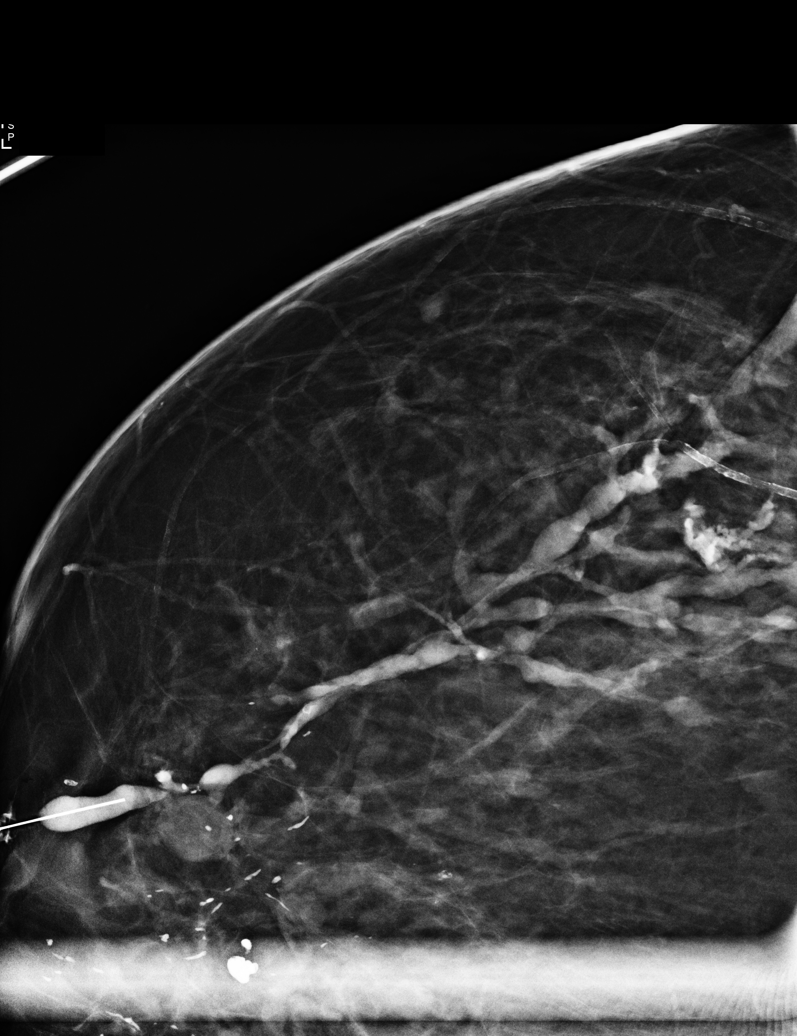

[5 of 5 positions shown; findings below may reference images not displayed]

FINDINGS: On physical exam, dark red bloody nipple discharge is elicited from
a single duct from the lower outer quadrant of the right nipple.

The discharging duct was cannulated with a 30 gauge sialogram
catheter and Omnipaque 300 was gently injected into the duct. CC and
mL magnification views of the right breast were performed. The
discharging duct is in the outer right breast in the 8 to 9 o'clock
position. Mammographic images show a beaded appearance of the
affected duct, with alternating areas of dilatation and relatively
more narrowed areas. I do not detect a discrete intraductal filling
defect.
IMPRESSION: Somewhat beaded appearance of the discharging duct in the outer
right breast in the 8-9 o'clock region. A discrete filling defect is
not identified to explain the discharge. Given the appearance of the
bloody discharge on physical exam, findings are concerning for

papilloma(s) or malignancy.

RECOMMENDATION:
Surgical consultation is recommended and has been scheduled for the
patient on [REDACTED] 10/30/2015 at 1 o'clock p.m. with Dr. Katsuhito.
Given no focal explanation for patient discharge on the ductogram,
and a previous negative ultrasound-guided biopsy, bilateral breast
MRI could be considered for further evaluation, and for possible
targeting for biopsy, if desired.

## 2021-04-02 DEATH — deceased
